# Patient Record
Sex: Male | Born: 1950
Health system: Southern US, Community
[De-identification: ages and names within clinical notes are randomized; demographics above are authoritative.]

## PROBLEM LIST (undated history)

## (undated) DIAGNOSIS — E119 Type 2 diabetes mellitus without complications: Secondary | ICD-10-CM

## (undated) DIAGNOSIS — I10 Essential (primary) hypertension: Secondary | ICD-10-CM

## (undated) DIAGNOSIS — C801 Malignant (primary) neoplasm, unspecified: Secondary | ICD-10-CM

## (undated) DIAGNOSIS — D649 Anemia, unspecified: Secondary | ICD-10-CM

## (undated) DIAGNOSIS — J45909 Unspecified asthma, uncomplicated: Secondary | ICD-10-CM

## (undated) HISTORY — PX: TONSILLECTOMY: SUR1361

## (undated) HISTORY — PX: WISDOM TOOTH EXTRACTION: SHX21

## (undated) HISTORY — PX: PROSTATE BIOPSY: SHX241

---

## 2000-03-08 ENCOUNTER — Encounter (INDEPENDENT_AMBULATORY_CARE_PROVIDER_SITE_OTHER): Payer: Self-pay | Admitting: *Deleted

## 2000-03-08 ENCOUNTER — Ambulatory Visit (HOSPITAL_COMMUNITY): Admission: RE | Admit: 2000-03-08 | Discharge: 2000-03-08 | Payer: Self-pay | Admitting: *Deleted

## 2002-10-23 ENCOUNTER — Encounter: Admission: RE | Admit: 2002-10-23 | Discharge: 2002-10-23 | Payer: Self-pay | Admitting: Gastroenterology

## 2002-10-23 ENCOUNTER — Encounter: Payer: Self-pay | Admitting: Gastroenterology

## 2003-01-30 ENCOUNTER — Encounter (INDEPENDENT_AMBULATORY_CARE_PROVIDER_SITE_OTHER): Payer: Self-pay | Admitting: *Deleted

## 2003-01-30 ENCOUNTER — Ambulatory Visit (HOSPITAL_COMMUNITY): Admission: RE | Admit: 2003-01-30 | Discharge: 2003-01-30 | Payer: Self-pay | Admitting: Gastroenterology

## 2006-01-04 ENCOUNTER — Ambulatory Visit: Admission: RE | Admit: 2006-01-04 | Discharge: 2006-04-04 | Payer: Self-pay | Admitting: Radiation Oncology

## 2006-06-06 ENCOUNTER — Ambulatory Visit: Admission: RE | Admit: 2006-06-06 | Discharge: 2006-08-12 | Payer: Self-pay | Admitting: Radiation Oncology

## 2006-06-15 ENCOUNTER — Ambulatory Visit (HOSPITAL_COMMUNITY): Admission: RE | Admit: 2006-06-15 | Discharge: 2006-06-15 | Payer: Self-pay | Admitting: Gastroenterology

## 2006-06-15 ENCOUNTER — Encounter (INDEPENDENT_AMBULATORY_CARE_PROVIDER_SITE_OTHER): Payer: Self-pay | Admitting: Gastroenterology

## 2006-08-12 ENCOUNTER — Ambulatory Visit: Admission: RE | Admit: 2006-08-12 | Discharge: 2006-11-10 | Payer: Self-pay | Admitting: Radiation Oncology

## 2006-09-27 ENCOUNTER — Encounter: Admission: RE | Admit: 2006-09-27 | Discharge: 2006-09-27 | Payer: Self-pay | Admitting: Urology

## 2006-10-03 ENCOUNTER — Ambulatory Visit (HOSPITAL_BASED_OUTPATIENT_CLINIC_OR_DEPARTMENT_OTHER): Admission: RE | Admit: 2006-10-03 | Discharge: 2006-10-03 | Payer: Self-pay | Admitting: Urology

## 2007-04-01 ENCOUNTER — Emergency Department (HOSPITAL_COMMUNITY): Admission: EM | Admit: 2007-04-01 | Discharge: 2007-04-01 | Payer: Self-pay | Admitting: Family Medicine

## 2008-01-26 HISTORY — PX: RADIOACTIVE SEED IMPLANT: SHX5150

## 2009-01-13 ENCOUNTER — Inpatient Hospital Stay (HOSPITAL_COMMUNITY): Admission: EM | Admit: 2009-01-13 | Discharge: 2009-01-17 | Payer: Self-pay | Admitting: Emergency Medicine

## 2009-01-29 ENCOUNTER — Encounter: Admission: RE | Admit: 2009-01-29 | Discharge: 2009-04-29 | Payer: Self-pay | Admitting: Internal Medicine

## 2010-04-27 LAB — BASIC METABOLIC PANEL
BUN: 25 mg/dL — ABNORMAL HIGH (ref 6–23)
CO2: 25 mEq/L (ref 19–32)
CO2: 27 mEq/L (ref 19–32)
Calcium: 8.3 mg/dL — ABNORMAL LOW (ref 8.4–10.5)
Calcium: 8.3 mg/dL — ABNORMAL LOW (ref 8.4–10.5)
Calcium: 8.5 mg/dL (ref 8.4–10.5)
Calcium: 9.4 mg/dL (ref 8.4–10.5)
Chloride: 100 mEq/L (ref 96–112)
Chloride: 106 mEq/L (ref 96–112)
Creatinine, Ser: 1.58 mg/dL — ABNORMAL HIGH (ref 0.4–1.5)
GFR calc Af Amer: >60 mL/min (ref >60)
GFR calc Af Amer: >60 mL/min (ref >60)
GFR calc non Af Amer: >60 mL/min (ref >60)
Glucose, Bld: 213 mg/dL — ABNORMAL HIGH (ref 70–99)
Glucose, Bld: 218 mg/dL — ABNORMAL HIGH (ref 70–99)
Glucose, Bld: 719 mg/dL (ref 70–99)
Potassium: 4 mEq/L (ref 3.5–5.1)
Sodium: 124 mEq/L — ABNORMAL LOW (ref 135–145)
Sodium: 138 mEq/L (ref 135–145)
Sodium: 138 mEq/L (ref 135–145)

## 2010-04-27 LAB — CK TOTAL AND CKMB (NOT AT ARMC)
CK, MB: 0.5 ng/mL (ref 0.3–4.0)
Relative Index: INVALID (ref 0.0–2.5)
Total CK: 62 U/L (ref 7–232)

## 2010-04-27 LAB — CARDIAC PANEL(CRET KIN+CKTOT+MB+TROPI)
CK, MB: 0.5 ng/mL (ref 0.3–4.0)
Relative Index: INVALID (ref 0.0–2.5)
Total CK: 57 U/L (ref 7–232)

## 2010-04-27 LAB — LIPID PANEL
LDL Cholesterol: 32 mg/dL (ref 0–99)
Total CHOL/HDL Ratio: 3.2 RATIO
VLDL: 59 mg/dL — ABNORMAL HIGH (ref 0–40)

## 2010-04-27 LAB — GLUCOSE, CAPILLARY
Glucose-Capillary: 215 mg/dL — ABNORMAL HIGH (ref 70–99)
Glucose-Capillary: 225 mg/dL — ABNORMAL HIGH (ref 70–99)
Glucose-Capillary: 226 mg/dL — ABNORMAL HIGH (ref 70–99)
Glucose-Capillary: 383 mg/dL — ABNORMAL HIGH (ref 70–99)
Glucose-Capillary: 386 mg/dL — ABNORMAL HIGH (ref 70–99)
Glucose-Capillary: 392 mg/dL — ABNORMAL HIGH (ref 70–99)
Glucose-Capillary: 542 mg/dL (ref 70–99)
Glucose-Capillary: >600 mg/dL (ref 70–99)

## 2010-04-27 LAB — POCT I-STAT, CHEM 8
BUN: 36 mg/dL — ABNORMAL HIGH (ref 6–23)
Chloride: 93 mEq/L — ABNORMAL LOW (ref 96–112)
Creatinine, Ser: 2 mg/dL — ABNORMAL HIGH (ref 0.4–1.5)
Sodium: 125 mEq/L — ABNORMAL LOW (ref 135–145)

## 2010-04-27 LAB — URINALYSIS, ROUTINE W REFLEX MICROSCOPIC
Glucose, UA: >1000 mg/dL — AB
Ketones, ur: NEGATIVE mg/dL
Leukocytes, UA: NEGATIVE
Nitrite: NEGATIVE
Protein, ur: NEGATIVE mg/dL

## 2010-04-27 LAB — POCT I-STAT 3, VENOUS BLOOD GAS (G3P V)
Bicarbonate: 30 mEq/L — ABNORMAL HIGH (ref 20.0–24.0)
pCO2, Ven: 42.4 mmHg — ABNORMAL LOW (ref 45.0–50.0)
pH, Ven: 7.458 — ABNORMAL HIGH (ref 7.250–7.300)
pO2, Ven: 25 mmHg — CL (ref 30.0–45.0)

## 2010-04-27 LAB — CBC
HCT: 31.2 % — ABNORMAL LOW (ref 39.0–52.0)
Hemoglobin: 13.5 g/dL (ref 13.0–17.0)
MCHC: 34.2 g/dL (ref 30.0–36.0)
MCV: 82.2 fL (ref 78.0–100.0)
MCV: 82.4 fL (ref 78.0–100.0)
Platelets: 238 10*3/uL (ref 150–400)
RBC: 3.78 MIL/uL — ABNORMAL LOW (ref 4.22–5.81)
RDW: 12.8 % (ref 11.5–15.5)
WBC: 5.5 10*3/uL (ref 4.0–10.5)
WBC: 5.8 10*3/uL (ref 4.0–10.5)

## 2010-04-27 LAB — HEMOGLOBIN A1C
Hgb A1c MFr Bld: 15.8 % — ABNORMAL HIGH (ref 4.6–6.1)
Mean Plasma Glucose: 407 mg/dL

## 2010-04-27 LAB — TSH: TSH: 0.704 u[IU]/mL (ref 0.350–4.500)

## 2010-06-09 NOTE — Op Note (Signed)
NAME:  Alexander Foster, Alexander Foster               ACCOUNT NO.:  [id number]   MEDICAL RECORD NO.:  [id number]          PATIENT TYPE:  AMB   LOCATION:  ENDO                         FACILITY:  [facility]   PHYSICIAN:  Anselmo Rod, M.D.  DATE OF BIRTH:  03-May-1950   DATE OF PROCEDURE:  06/15/2006  DATE OF DISCHARGE:                               OPERATIVE REPORT   PROCEDURE PERFORMED:  Colonoscopy with snare polypectomy x2.   ENDOSCOPIST:  Anselmo Rod, MD   INSTRUMENT USED:  Pentax video colonoscope.   INDICATIONS FOR PROCEDURE:  [age]-year-old African American male  with a personal history of prostate cancer and diabetes, undergoing a  screening colonoscopy to rule out colonic polyps, masses, etc.   PREPROCEDURE PREPARATION:  Informed consent was procured from the  patient.  The patient fasted for 4 hours prior to the procedure and  prepped with MoviPrep the night of and the morning of the procedure.  Risks and benefits of the procedure including a 10% miss rate of cancer  in polyp were discussed with the patient as well.   PREPROCEDURE PHYSICAL:  VITAL SIGNS:  The patient had stable vital  signs.  NECK:  Supple.  CHEST:  Clear to auscultation.  CARDIAC:  S1 and S2 regular.  ABDOMEN:  Soft with normal bowel sounds.   DESCRIPTION OF PROCEDURE:  The patient was placed in the left lateral  decubitus position and sedated with 75 mcg of Fentanyl and 7.5 mg of  Versed given intravenously in slow incremental doses.  Once the patient  was adequately sedated and maintained on low-flow oxygen and continuous  cardiac monitoring, the Pentax video colonoscope was advanced from the  rectum to the cecum.  Diverticulosis was noted throughout the colon with  more prominent changes in the left side.  Small internal hemorrhoids  were seen.  One flat polyp was removed via snare from the distal right  colon and 1 from the left colon (hot snare x2).  The terminal ileum  appeared healthy and without  lesions.  The appendiceal orifice and  ileocecal valve were clearly visualized and photographed.  The polyp  removed from the left colon was lost in stool.  The patient tolerated  the procedure well without complications.   IMPRESSION:  1. Small nonbleeding internal hemorrhoids.  2. One flat polyp removed from the left colon and 1 from the distal      right colon; polyp from the left colon was lost in stool.  3. Diverticulosis with more prominent changes in the sigmoid colon.  4. Normal terminal ileum.   RECOMMENDATIONS:  1. Await pathology results.  2. Avoid all nonsteroidals including aspirin for the next 2 weeks.  3. Repeat colonoscopy depending on pathology results.  4. A high-fiber diet has been advocated and brochures on      diverticulosis have been given to the patient for his education.  5. Outpatient followup as need arises in the future.      Anselmo Rod, M.D.  Electronically Signed     JNM/MEDQ  D:  06/16/2006  T:  06/16/2006  Job:  [id number]  cc:   Renaye Rakers, M.D.

## 2010-06-09 NOTE — Op Note (Signed)
NAME:  Alexander Foster, Alexander Foster               ACCOUNT NO.:  [id number]   MEDICAL RECORD NO.:  [id number]          PATIENT TYPE:  AMB   LOCATION:  [facility]                         FACILITY:  [facility]   PHYSICIAN:  Lindaann Slough, M.D.  DATE OF BIRTH:  12-18-1950   DATE OF PROCEDURE:  10/03/2006  DATE OF DISCHARGE:                               OPERATIVE REPORT   PREOPERATIVE DIAGNOSIS:  Adenocarcinoma of prostate.   POSTOPERATIVE DIAGNOSIS:  Adenocarcinoma of prostate.   PROCEDURE:  Cystoscopy and I-125 seeds implantation.   SURGEONS:  Danae Chen, M.D., and Maryln Gottron, M.D.   ANESTHESIA:  General.   INDICATIONS:  The patient is a 60 year old male with adenocarcinoma of  prostate and Gleason score 6.  PSA at diagnosis was 8.5.  His PSA was  2.9 in 2004, repeat PSA after the biopsy was down to 2.2.  The patient  was found to have one focus of adenocarcinoma of the prostate at the  left apex.  Treatment options were discussed with him.  Those options  include active surveillance, radical prostatectomy external beam  brachytherapy and cryoablation and he chose to have seeds implantation  in view of his low-volume disease.  He received one injection of Lupron  30 mg to downsize the prostate.  He is scheduled today to have the  procedure.   DESCRIPTION OF PROCEDURE:  Under general anesthesia, the patient was  prepped and draped and placed in the dorsal lithotomy position.  A Foley  catheter was inserted in the bladder and a rectal tube was inserted in  the rectum.  Then the transducer was inserted in the rectum.  The grid  was then attached to the transducer.  Two stabilizing needles were  placed in the prostate, then ultrasound planning was done by Dr. Dayton Scrape.  When the planning was completed, with the Nucletron a total of 62 seeds  were inserted in the prostate through 23 needles.  The total activity is  34.844 mCi.  When the seeds implantation was completed, the Foley  catheter was removed.   A flexible cystoscope was then inserted in the  bladder.  The anterior urethra is normal.  There are two seeds in the  bladder.  There is no stone or tumor.  The ureteral orifices are in  normal position and shape with clear efflux.  Then with a grasping  forceps, those two seeds were extracted.  The cystoscope was reinserted  in the bladder and there was no evidence of remaining seed in the  bladder.  Therefore a total of 60 seeds where inserted in the prostate.  The cystoscope was then removed.  A #16 Foley catheter was then inserted  in the bladder.   The patient tolerated the procedure well and left the OR in satisfactory  condition to post anesthesia care unit.      Lindaann Slough, M.D.  Electronically Signed     MN/MEDQ  D:  10/03/2006  T:  10/03/2006  Job:  [id number]

## 2010-06-12 NOTE — Op Note (Signed)
NAME:  Alexander Foster, Alexander Foster                         ACCOUNT NO.:  [id number]   MEDICAL RECORD NO.:  [id number]                   PATIENT TYPE:  AMB   LOCATION:  ENDO                                 FACILITY:  [facility]   PHYSICIAN:  Anselmo Rod, M.D.               DATE OF BIRTH:  12/21/1950   DATE OF PROCEDURE:  01/30/2003  DATE OF DISCHARGE:                                 OPERATIVE REPORT   PROCEDURE:  Colonoscopy with snare polypectomy x 2.   ENDOSCOPIST:  Charna Elizabeth, M.D.   INSTRUMENT USED:  Olympus video colonoscope.   INDICATIONS FOR PROCEDURE:  60 year old African American male with a history  of change in bowel habits, diverticulitis treated with antibiotics in the  recent past, and rectal bleeding, rule out colonic polyps, masses, etc.   PREPROCEDURE PREPARATION:  Informed consent was obtained from the patient.  The patient was fasted for eight hours prior to the procedure and prepped  with a bottle of magnesium citrate and a gallon of GoLYTELY the night prior  to the procedure.   PREPROCEDURE PHYSICAL:  Patient with stable vital signs.  Neck supple.  Chest clear to auscultation.  S1 and S2 regular.  Abdomen soft with normal  bowel sounds.   DESCRIPTION OF PROCEDURE:  The patient was placed in the left lateral  decubitus position, sedated with 50 mg of Demerol and 7 mg Versed in  incremental doses.  Once the patient was adequately sedated, maintained on  low flow oxygen, continuous cardiac monitoring, the Olympus video  colonoscope was advanced into the rectum to the cecum with difficulty.  The  patient had a large amount of residual stool in the colon secondary to an  inadequate prep.  There was scattered diverticula seen throughout the colon.  The appendiceal orifice and ileocecal valve were clearly visualized and  photographed.  Small internal hemorrhoids were seen on retroflexion of the  rectum.  Two small sessile polyps measuring about 4-6 mm were snared from  the  rectum.  The patient tolerated the procedure well without complications.  Small lesions could have been missed.   IMPRESSION:  1. Small nonbleeding internal hemorrhoids.  2. Scattered diverticulosis with more prominent changes in the sigmoid     colon.  3. Two small sessile polyps measuring 4-6 mm in size snared from the rectum.  4. Some residual stool in the colon, small lesions could have been missed.   RECOMMENDATIONS:  1. Await pathology results.  2. Avoid all nonsteroidals including aspirin for the next four weeks.  3. Outpatient follow up in the next two weeks for further recommendations.                                               Anselmo Rod, M.D.  JNM/MEDQ  D:  01/30/2003  T:  01/30/2003  Job:  [id number]   cc:   Renaye Rakers, M.D.  [phone]  [place]  [place] [address]  Fax: [phone]

## 2010-10-19 LAB — CULTURE, ROUTINE-ABSCESS

## 2010-11-06 LAB — COMPREHENSIVE METABOLIC PANEL
Albumin: 3.9
BUN: 17
Calcium: 9.5
Creatinine, Ser: 1.21
Potassium: 4.6
Total Protein: 7.5

## 2010-11-06 LAB — APTT: aPTT: 30

## 2010-11-06 LAB — CBC
HCT: 35.3 — ABNORMAL LOW
MCHC: 34.3
MCV: 79.9
Platelets: 292
RDW: 14.4 — ABNORMAL HIGH

## 2010-11-06 LAB — PROTIME-INR: INR: 1

## 2013-04-19 ENCOUNTER — Telehealth: Payer: Self-pay | Admitting: Internal Medicine

## 2013-04-19 NOTE — Telephone Encounter (Signed)
C/D 04/19/13 for appt. 05/08/13

## 2013-04-19 NOTE — Telephone Encounter (Signed)
S/W PATIENT AND GAVE NEW PATIENT APPT FOR 04/14 @ 11 W/DR. [place] [facility] WELCOME PACKET MAILED.

## 2013-04-23 NOTE — Telephone Encounter (Signed)
S/W PATIENT AND GAVE NEW PATIENT APPT FOR 04/14 @ 11 W/DR. MOHAMED °REFERRING DR. VEITA BLAND °[facility]- [facility] °WELCOME PACKET MAILED.  °

## 2013-05-07 ENCOUNTER — Other Ambulatory Visit: Payer: Self-pay | Admitting: Medical Oncology

## 2013-05-07 DIAGNOSIS — R710 Precipitous drop in hematocrit: Secondary | ICD-10-CM

## 2013-05-08 ENCOUNTER — Encounter: Payer: Self-pay | Admitting: Internal Medicine

## 2013-05-08 ENCOUNTER — Other Ambulatory Visit (HOSPITAL_BASED_OUTPATIENT_CLINIC_OR_DEPARTMENT_OTHER): Payer: BC Managed Care – PPO

## 2013-05-08 ENCOUNTER — Ambulatory Visit (HOSPITAL_BASED_OUTPATIENT_CLINIC_OR_DEPARTMENT_OTHER): Payer: BC Managed Care – PPO | Admitting: Internal Medicine

## 2013-05-08 ENCOUNTER — Ambulatory Visit: Payer: BC Managed Care – PPO

## 2013-05-08 ENCOUNTER — Telehealth: Payer: Self-pay | Admitting: Internal Medicine

## 2013-05-08 ENCOUNTER — Ambulatory Visit (HOSPITAL_BASED_OUTPATIENT_CLINIC_OR_DEPARTMENT_OTHER): Payer: BC Managed Care – PPO

## 2013-05-08 VITALS — BP 110/73 | HR 83 | Temp 98.6°F | Resp 20 | Ht 71.0 in | Wt 228.2 lb

## 2013-05-08 DIAGNOSIS — D649 Anemia, unspecified: Secondary | ICD-10-CM

## 2013-05-08 DIAGNOSIS — R710 Precipitous drop in hematocrit: Secondary | ICD-10-CM

## 2013-05-08 DIAGNOSIS — D539 Nutritional anemia, unspecified: Secondary | ICD-10-CM

## 2013-05-08 LAB — CBC WITH DIFFERENTIAL/PLATELET
BASO%: 0.3 % (ref 0.0–2.0)
Basophils Absolute: 0 10*3/uL (ref 0.0–0.1)
EOS%: 1.6 % (ref 0.0–7.0)
Eosinophils Absolute: 0.1 10*3/uL (ref 0.0–0.5)
HEMATOCRIT: 38.5 % (ref 38.4–49.9)
HGB: 12.6 g/dL — ABNORMAL LOW (ref 13.0–17.1)
LYMPH%: 27.7 % (ref 14.0–49.0)
MCH: 26.5 pg — AB (ref 27.2–33.4)
MCHC: 32.6 g/dL (ref 32.0–36.0)
MCV: 81.2 fL (ref 79.3–98.0)
MONO#: 0.4 10*3/uL (ref 0.1–0.9)
MONO%: 7.9 % (ref 0.0–14.0)
NEUT#: 3.3 10*3/uL (ref 1.5–6.5)
NEUT%: 62.5 % (ref 39.0–75.0)
PLATELETS: 240 10*3/uL (ref 140–400)
RBC: 4.74 10*6/uL (ref 4.20–5.82)
RDW: 13.8 % (ref 11.0–14.6)
WBC: 5.3 10*3/uL (ref 4.0–10.3)
lymph#: 1.5 10*3/uL (ref 0.9–3.3)

## 2013-05-08 LAB — RETICULOCYTES
Immature Retic Fract: 4.5 % (ref 3.00–10.60)
RBC: 4.72 10*6/uL (ref 4.20–5.82)
Retic %: 1.21 % (ref 0.80–1.80)
Retic Ct Abs: 57.11 10*3/uL (ref 34.80–93.90)

## 2013-05-08 LAB — IRON AND TIBC CHCC
%SAT: 39 % (ref 20–55)
Iron: 122 ug/dL (ref 42–163)
TIBC: 315 ug/dL (ref 202–409)
UIBC: 193 ug/dL (ref 117–376)

## 2013-05-08 LAB — FERRITIN CHCC: Ferritin: 36 ng/ml (ref 22–316)

## 2013-05-08 LAB — TSH CHCC: TSH: 0.668 m(IU)/L (ref 0.320–4.118)

## 2013-05-08 NOTE — Progress Notes (Signed)
Checked in new pt with no financial concerns. °

## 2013-05-08 NOTE — Telephone Encounter (Signed)
Gave pt appt for lab and Md fotr may , sent pt to labs today

## 2013-05-08 NOTE — Progress Notes (Signed)
[place] Telephone:([phone]   Fax:([phone]  CONSULT NOTE  REFERRING PHYSICIAN: Dr. Lucianne Lei  REASON FOR CONSULTATION:  63 years old African American male with persistent anemia.  HPI Alexander Foster is a 63 y.o. male with a past medical history significant for diabetes mellitus, hypertension, dyslipidemia, history of asthma and diagnosis of prostate adenocarcinoma status post seed implants in 2010 and the patient is currently followed by Dr. Janice Norrie. He was seen recently by his primary care physician Dr. Criss Rosales and CBC performed on 04/09/2013 showed low hemoglobin of 11.8 and hematocrit 35.0%. The total white blood count was normal at 4.8 and platelets count 223,000. Iron study performed on the same day showed serum iron of 64, total iron binding capacity 312 and iron saturation 21%. The patient has been complaining of increasing fatigue and weakness recently but he relates part of it to his low testosterone. He is expected to discuss androgen treatment with Dr. Janice Norrie. The patient denied having any dizzy spells. He has occasional rectal bleeding from questionable hemorrhoids. He had colonoscopy last performed and showed few platelets that were removed. He takes over-the-counter multivitamins and aspirin daily. He denied having any significant nausea, vomiting or change in his bowel movement. The patient denied having any chest pain, but has shortness of breath with exertion with no cough or hemoptysis. He denied having any weight loss or night sweats.  PAST MEDICAL HISTORY: Significant for diabetes mellitus, hypertension, dyslipidemia, asthma and history of prostate cancer status post seed implants in 2010.  FAMILY HISTORY: Mother died at age 65 with heart attack and she had COPD. Father is 33 and is still alive   SOCIAL HISTORY: The patient is married and has 2 sons and one daughter. He teaches at [employer]. he has no history of smoking but drinks alcohol occasionally  and no history of drug abuse.   HPI  No Known Allergies  Current Outpatient Prescriptions  Medication Sig Dispense Refill  . aspirin 81 MG tablet Take 81 mg by mouth daily.      . insulin glargine (LANTUS) 100 UNIT/ML injection Inject 10 Units into the skin 2 (two) times daily.      . Liraglutide (VICTOZA [place]) Inject 18 Units into the skin every morning.      . metFORMIN (GLUCOPHAGE) 500 MG tablet Take by mouth 2 (two) times daily with a meal.      . montelukast (SINGULAIR) 10 MG tablet Take 10 mg by mouth at bedtime.      . niacin (NIASPAN) 500 MG CR tablet Take 500 mg by mouth at bedtime.      . niacin-simvastatin (SIMCOR) 500-20 MG 24 hr tablet Take 1 tablet by mouth at bedtime.      . Olmesartan-Amlodipine-HCTZ (TRIBENZOR PO) Take 1 tablet by mouth daily.      . tamsulosin (FLOMAX) 0.4 MG CAPS capsule Take 0.4 mg by mouth daily after supper.       No current facility-administered medications for this visit.    Review of Systems  Constitutional: positive for fatigue Eyes: negative Ears, nose, mouth, throat, and face: negative Respiratory: positive for dyspnea on exertion Cardiovascular: negative Gastrointestinal: negative Genitourinary:negative Integument/breast: negative Hematologic/lymphatic: negative Musculoskeletal:negative Neurological: negative Behavioral/Psych: negative Endocrine: negative Allergic/Immunologic: negative  Physical Exam  QMV:HQION, healthy, no distress, well nourished and well developed SKIN: skin color, texture, turgor are normal, no rashes or significant lesions HEAD: Normocephalic, No masses, lesions, tenderness or abnormalities EYES: normal, PERRLA EARS: External ears  normal, Canals clear OROPHARYNX:no exudate, no erythema and lips, buccal mucosa, and tongue normal  NECK: supple, no adenopathy, no JVD LYMPH:  no palpable lymphadenopathy, no hepatosplenomegaly LUNGS: clear to auscultation , and palpation HEART: regular rate & rhythm, no  murmurs and no gallops ABDOMEN:abdomen soft, non-tender, normal bowel sounds and no masses or organomegaly BACK: Back symmetric, no curvature., No CVA tenderness EXTREMITIES:no joint deformities, effusion, or inflammation, no edema, no skin discoloration, no clubbing  NEURO: alert & oriented x 3 with fluent speech, no focal motor/sensory deficits  PERFORMANCE STATUS: ECOG 1  LABORATORY DATA: Lab Results  Component Value Date   WBC 5.3 05/08/2013   HGB 12.6* 05/08/2013   HCT 38.5 05/08/2013   MCV 81.2 05/08/2013   PLT 240 05/08/2013      Chemistry      Component Value Date/Time   NA 138 01/17/2009 0445   K 3.9 01/17/2009 0445   CL 106 01/17/2009 0445   CO2 25 01/17/2009 0445   BUN 10 01/17/2009 0445   CREATININE 1.15 01/17/2009 0445      Component Value Date/Time   CALCIUM 8.3* 01/17/2009 0445   ALKPHOS 56 09/27/2006 0850   AST 19 09/27/2006 0850   ALT 30 09/27/2006 0850   BILITOT 0.9 09/27/2006 0850       RADIOGRAPHIC STUDIES: No results found.  ASSESSMENT: This is a very pleasant 63 years old [place] American male with persistent anemia of unclear etiology at this point.   PLAN: I have a lengthy discussion with the patient today about his condition. I ordered several studies to evaluate the etiology of his anemia including repeat CBC, comprehensive metabolic panel, LDH, iron study, ferritin, serum erythropoietin, serum folate, serum B12 as well as serum protein electrophoreses I will see the patient back for followup visit in 3 weeks for evaluation and discussion of his lab results. If no clear etiology for his anemia, I may consider the patient for a bone marrow biopsy and aspirate to rule out any myelodysplastic abnormalities. The patient was advised to call immediately if he has any concerning symptoms in the interval. The patient voices understanding of current disease status and treatment options and is in agreement with the current care plan.  All questions were answered.  The patient knows to call the clinic with any problems, questions or concerns. We can certainly see the patient much sooner if necessary.  Thank you so much for allowing me to participate in the care of Alexander Foster. I will continue to follow up the patient with you and assist in his care.  I spent 40 minutes counseling the patient face to face. The total time spent in the appointment was 55 minutes.  Disclaimer: This note was dictated with voice recognition software. Similar sounding words can inadvertently be transcribed and may not be corrected upon review.   Curt Bears 05/08/2013, 12:12 PM

## 2013-05-10 LAB — PROTEIN ELECTROPHORESIS, SERUM, WITH REFLEX
ALBUMIN ELP: 51.9 % — AB (ref 55.8–66.1)
ALPHA-1-GLOBULIN: 6 % — AB (ref 2.9–4.9)
ALPHA-2-GLOBULIN: 11 % (ref 7.1–11.8)
BETA 2: 5.2 % (ref 3.2–6.5)
BETA GLOBULIN: 6.2 % (ref 4.7–7.2)
Gamma Globulin: 19.7 % — ABNORMAL HIGH (ref 11.1–18.8)
TOTAL PROTEIN, SERUM ELECTROPHOR: 7.8 g/dL (ref 6.0–8.3)

## 2013-05-10 LAB — FOLATE

## 2013-05-10 LAB — VITAMIN B12: VITAMIN B 12: 527 pg/mL (ref 211–911)

## 2013-05-10 LAB — ERYTHROPOIETIN: Erythropoietin: 20.6 m[IU]/mL — ABNORMAL HIGH (ref 2.6–18.5)

## 2013-05-30 ENCOUNTER — Other Ambulatory Visit (HOSPITAL_BASED_OUTPATIENT_CLINIC_OR_DEPARTMENT_OTHER): Payer: BC Managed Care – PPO

## 2013-05-30 ENCOUNTER — Telehealth: Payer: Self-pay | Admitting: Internal Medicine

## 2013-05-30 ENCOUNTER — Encounter: Payer: Self-pay | Admitting: Internal Medicine

## 2013-05-30 ENCOUNTER — Ambulatory Visit (HOSPITAL_BASED_OUTPATIENT_CLINIC_OR_DEPARTMENT_OTHER): Payer: BC Managed Care – PPO | Admitting: Internal Medicine

## 2013-05-30 VITALS — BP 135/73 | HR 75 | Temp 98.4°F | Resp 18 | Ht 71.0 in | Wt 232.3 lb

## 2013-05-30 DIAGNOSIS — R5381 Other malaise: Secondary | ICD-10-CM

## 2013-05-30 DIAGNOSIS — D539 Nutritional anemia, unspecified: Secondary | ICD-10-CM

## 2013-05-30 DIAGNOSIS — R5383 Other fatigue: Secondary | ICD-10-CM

## 2013-05-30 DIAGNOSIS — D649 Anemia, unspecified: Secondary | ICD-10-CM

## 2013-05-30 LAB — CBC WITH DIFFERENTIAL/PLATELET
BASO%: 0.1 % (ref 0.0–2.0)
BASOS ABS: 0 10*3/uL (ref 0.0–0.1)
EOS%: 2.1 % (ref 0.0–7.0)
Eosinophils Absolute: 0.1 10*3/uL (ref 0.0–0.5)
HEMATOCRIT: 35.6 % — AB (ref 38.4–49.9)
HEMOGLOBIN: 11.9 g/dL — AB (ref 13.0–17.1)
LYMPH%: 24.7 % (ref 14.0–49.0)
MCH: 27 pg — AB (ref 27.2–33.4)
MCHC: 33.3 g/dL (ref 32.0–36.0)
MCV: 80.9 fL (ref 79.3–98.0)
MONO#: 0.5 10*3/uL (ref 0.1–0.9)
MONO%: 10.1 % (ref 0.0–14.0)
NEUT#: 3.3 10*3/uL (ref 1.5–6.5)
NEUT%: 63 % (ref 39.0–75.0)
Platelets: 220 10*3/uL (ref 140–400)
RBC: 4.4 10*6/uL (ref 4.20–5.82)
RDW: 14.1 % (ref 11.0–14.6)
WBC: 5.2 10*3/uL (ref 4.0–10.3)
lymph#: 1.3 10*3/uL (ref 0.9–3.3)

## 2013-05-30 NOTE — Telephone Encounter (Signed)
Gave pt appt for lab and MD for June  °

## 2013-05-30 NOTE — Progress Notes (Signed)
[place] Telephone:([phone]   Fax:([phone]  OFFICE PROGRESS NOTE  BLAND,VEITA J, MD [address] [place] [address]  DIAGNOSIS: Mild anemia of unclear etiology.  PRIOR THERAPY: None  CURRENT THERAPY: None  INTERVAL HISTORY: Alexander Foster 63 y.o. male returns to the clinic today for followup visit. The patient had several studies performed recently to evaluate his anemia including iron study and ferritin that were unremarkable. Vitamin B 12 was normal, serum folate was normal. Serum protein electrophoreses showed no detectable M spike and serum erythropoietin level was elevated.  He is here today for evaluation and discussion of his lab results be the patient is feeling fine with no specific complaints except for mild fatigue. He denied having any dizzy spells. He denied having any chest pain, shortness of breath, cough or hemoptysis. He has no weight loss or night sweats.   ALLERGIES:  has No Known Allergies.  MEDICATIONS:  Current Outpatient Prescriptions  Medication Sig Dispense Refill  . aspirin 81 MG tablet Take 81 mg by mouth daily.      . insulin glargine (LANTUS) 100 UNIT/ML injection Inject 10 Units into the skin 2 (two) times daily.      . Liraglutide (VICTOZA [place]) Inject 18 Units into the skin every morning.      . metFORMIN (GLUCOPHAGE) 500 MG tablet Take by mouth 2 (two) times daily with a meal.      . montelukast (SINGULAIR) 10 MG tablet Take 10 mg by mouth at bedtime.      . niacin (NIASPAN) 500 MG CR tablet Take 500 mg by mouth at bedtime.      . niacin-simvastatin (SIMCOR) 500-20 MG 24 hr tablet Take 1 tablet by mouth at bedtime.      . Olmesartan-Amlodipine-HCTZ (TRIBENZOR PO) Take 1 tablet by mouth daily.      . tamsulosin (FLOMAX) 0.4 MG CAPS capsule Take 0.4 mg by mouth daily after supper.       No current facility-administered medications for this visit.    REVIEW OF SYSTEMS:  A comprehensive review of systems was  negative except for: Constitutional: positive for fatigue   PHYSICAL EXAMINATION: General appearance: alert, cooperative, fatigued and no distress Head: Normocephalic, without obvious abnormality, atraumatic Neck: no adenopathy, no JVD, supple, symmetrical, trachea midline and thyroid not enlarged, symmetric, no tenderness/mass/nodules Lymph nodes: Cervical, supraclavicular, and axillary nodes normal. Resp: clear to auscultation bilaterally Back: symmetric, no curvature. ROM normal. No CVA tenderness. Cardio: regular rate and rhythm, S1, S2 normal, no murmur, click, rub or gallop GI: soft, non-tender; bowel sounds normal; no masses,  no organomegaly Extremities: extremities normal, atraumatic, no cyanosis or edema  ECOG PERFORMANCE STATUS: 0 - Asymptomatic  Blood pressure 135/73, pulse 75, temperature 98.4 F (36.9 C), temperature source Oral, resp. rate 18, height _0  (1.803 m), weight 232 lb 4.8 oz (105.371 kg), SpO2 100.00%.  LABORATORY DATA: Lab Results  Component Value Date   WBC 5.2 05/30/2013   HGB 11.9* 05/30/2013   HCT 35.6* 05/30/2013   MCV 80.9 05/30/2013   PLT 220 05/30/2013      Chemistry      Component Value Date/Time   NA 138 01/17/2009 0445   K 3.9 01/17/2009 0445   CL 106 01/17/2009 0445   CO2 25 01/17/2009 0445   BUN 10 01/17/2009 0445   CREATININE 1.15 01/17/2009 0445      Component Value Date/Time   CALCIUM 8.3* 01/17/2009 0445  ALKPHOS 56 09/27/2006 0850   AST 19 09/27/2006 0850   ALT 30 09/27/2006 0850   BILITOT 0.9 09/27/2006 0850       RADIOGRAPHIC STUDIES: No results found.  ASSESSMENT AND PLAN: This is a very pleasant 63 years old African American male with mild anemia of unclear etiology questionable for anemia of chronic disease. I had a lengthy discussion with the patient today about his current lab results and further investigation to identify the etiology of his anemia. I recommended for the patient to proceed with the bone marrow biopsy and  aspirate to rule out any myelodysplasia.  The patient will be out of the country in the next few weeks and he would like to do the bone marrow biopsy and aspirate after he comes back. I would see him back for followup visit in one month for evaluation and discussion of his biopsy results. He was advised to call immediately if he has any concerning symptoms in the interval. The patient voices understanding of current disease status and treatment options and is in agreement with the current care plan.  All questions were answered. The patient knows to call the clinic with any problems, questions or concerns. We can certainly see the patient much sooner if necessary.  Disclaimer: This note was dictated with voice recognition software. Similar sounding words can inadvertently be transcribed and may not be corrected upon review.

## 2013-06-21 ENCOUNTER — Encounter (HOSPITAL_COMMUNITY): Payer: Self-pay | Admitting: Pharmacy Technician

## 2013-06-25 ENCOUNTER — Telehealth: Payer: Self-pay | Admitting: *Deleted

## 2013-06-25 ENCOUNTER — Other Ambulatory Visit: Payer: Self-pay | Admitting: Radiology

## 2013-06-25 NOTE — Telephone Encounter (Signed)
Pt called stating he has bronchitis and needs to delay his biopsy.  Forwarded msg to [facility] in scheduling to try to schedule biopsy a few days before next f/u.  SLJ

## 2013-06-27 ENCOUNTER — Ambulatory Visit (HOSPITAL_COMMUNITY): Admission: RE | Admit: 2013-06-27 | Payer: BC Managed Care – PPO | Source: Ambulatory Visit

## 2013-06-27 ENCOUNTER — Inpatient Hospital Stay (HOSPITAL_COMMUNITY): Admission: RE | Admit: 2013-06-27 | Payer: BC Managed Care – PPO | Source: Ambulatory Visit

## 2013-07-04 ENCOUNTER — Ambulatory Visit: Payer: BC Managed Care – PPO | Admitting: Internal Medicine

## 2013-07-04 ENCOUNTER — Telehealth: Payer: Self-pay | Admitting: Internal Medicine

## 2013-07-04 NOTE — Telephone Encounter (Signed)
returned pt call and r/s appt after BX....pt ok adn aware

## 2013-07-09 ENCOUNTER — Other Ambulatory Visit: Payer: Self-pay | Admitting: Radiology

## 2013-07-10 ENCOUNTER — Ambulatory Visit: Payer: BC Managed Care – PPO | Admitting: Internal Medicine

## 2013-07-10 ENCOUNTER — Other Ambulatory Visit: Payer: BC Managed Care – PPO

## 2013-07-13 ENCOUNTER — Encounter (HOSPITAL_COMMUNITY): Payer: Self-pay

## 2013-07-13 ENCOUNTER — Ambulatory Visit (HOSPITAL_COMMUNITY)
Admission: RE | Admit: 2013-07-13 | Discharge: 2013-07-13 | Disposition: A | Discharge status: Home / Self Care | Payer: BC Managed Care – PPO | Source: Ambulatory Visit | Attending: Internal Medicine | Admitting: Internal Medicine

## 2013-07-13 DIAGNOSIS — D649 Anemia, unspecified: Secondary | ICD-10-CM | POA: Insufficient documentation

## 2013-07-13 DIAGNOSIS — Z8546 Personal history of malignant neoplasm of prostate: Secondary | ICD-10-CM | POA: Insufficient documentation

## 2013-07-13 DIAGNOSIS — D539 Nutritional anemia, unspecified: Secondary | ICD-10-CM

## 2013-07-13 HISTORY — DX: Malignant (primary) neoplasm, unspecified: C80.1

## 2013-07-13 HISTORY — DX: Essential (primary) hypertension: I10

## 2013-07-13 HISTORY — DX: Type 2 diabetes mellitus without complications: E11.9

## 2013-07-13 HISTORY — DX: Anemia, unspecified: D64.9

## 2013-07-13 LAB — CBC
HCT: 33.5 % — ABNORMAL LOW (ref 39.0–52.0)
Hemoglobin: 11.3 g/dL — ABNORMAL LOW (ref 13.0–17.0)
MCH: 26.8 pg (ref 26.0–34.0)
MCHC: 33.7 g/dL (ref 30.0–36.0)
MCV: 79.4 fL (ref 78.0–100.0)
Platelets: 206 10*3/uL (ref 150–400)
RBC: 4.22 MIL/uL (ref 4.22–5.81)
RDW: 13.3 % (ref 11.5–15.5)
WBC: 4.8 10*3/uL (ref 4.0–10.5)

## 2013-07-13 LAB — APTT: aPTT: 30 seconds (ref 24–37)

## 2013-07-13 LAB — PROTIME-INR
INR: 1.05 (ref 0.00–1.49)
PROTHROMBIN TIME: 13.5 s (ref 11.6–15.2)

## 2013-07-13 LAB — GLUCOSE, CAPILLARY: Glucose-Capillary: 158 mg/dL — ABNORMAL HIGH (ref 70–99)

## 2013-07-13 LAB — BONE MARROW EXAM

## 2013-07-13 MED ORDER — MIDAZOLAM HCL 2 MG/2ML IJ SOLN
INTRAMUSCULAR | Status: AC
  Filled 2013-07-13: qty 6

## 2013-07-13 MED ORDER — MIDAZOLAM HCL 2 MG/2ML IJ SOLN
INTRAMUSCULAR | Status: AC | PRN
  Administered 2013-07-13 (×2): 1 mg via INTRAVENOUS

## 2013-07-13 MED ORDER — HYDROCODONE-ACETAMINOPHEN 5-325 MG PO TABS
1.0000 | ORAL_TABLET | ORAL | Status: DC | PRN
  Filled 2013-07-13: qty 2

## 2013-07-13 MED ORDER — FENTANYL CITRATE 0.05 MG/ML IJ SOLN
INTRAMUSCULAR | Status: AC | PRN
  Administered 2013-07-13 (×2): 50 ug via INTRAVENOUS

## 2013-07-13 MED ORDER — FENTANYL CITRATE 0.05 MG/ML IJ SOLN
INTRAMUSCULAR | Status: AC
  Filled 2013-07-13: qty 6

## 2013-07-13 MED ORDER — SODIUM CHLORIDE 0.9 % IV SOLN
INTRAVENOUS | Status: DC
  Administered 2013-07-13: 09:00:00 via INTRAVENOUS

## 2013-07-13 NOTE — H&P (Signed)
Chief Complaint: "I am here for a bone marrow biopsy." Referring Physician: Dr. Julien Nordmann HPI: Alexander Foster is an 63 y.o. male who presents today for a bone marrow biopsy. The patient sees Dr. Julien Nordmann for anemia of unclear etiology. He denies any chest pain, shortness of breath or palpitations. He denies any active signs of bleeding or excessive bruising. He denies any recent fever or chills. The patient denies any history of sleep apnea or chronic oxygen use. He has previously tolerated sedation without complications.   Past Medical History:  Past Medical History  Diagnosis Date  . Hypertension   . Diabetes mellitus without complication   . Anemia   . Cancer     seed implant for prostate cancer    Past Surgical History:  Past Surgical History  Procedure Laterality Date  . Radioactive seed implant  2010    Family History: No family history on file.  Social History:  reports that he has never smoked. He does not have any smokeless tobacco history on file. He reports that he drinks alcohol. His drug history is not on file.  Allergies: No Known Allergies  Medications:   Medication List    ASK your doctor about these medications       aspirin 81 MG chewable tablet  Chew 81 mg by mouth daily.     insulin glargine 100 UNIT/ML injection  Commonly known as:  LANTUS  Inject 10 Units into the skin 2 (two) times daily.     metFORMIN 500 MG tablet  Commonly known as:  GLUCOPHAGE  Take 500 mg by mouth 2 (two) times daily with a meal.     montelukast 10 MG tablet  Commonly known as:  SINGULAIR  Take 10 mg by mouth at bedtime.     multivitamin with minerals Tabs tablet  Take 1 tablet by mouth daily.     niacin 500 MG CR tablet  Commonly known as:  NIASPAN  Take 500 mg by mouth at bedtime.     niacin-simvastatin 500-20 MG 24 hr tablet  Commonly known as:  SIMCOR  Take 1 tablet by mouth at bedtime.     Olmesartan-Amlodipine-HCTZ 20-5-12.5 MG Tabs  Take 1 tablet by mouth  every morning.     tamsulosin 0.4 MG Caps capsule  Commonly known as:  FLOMAX  Take 0.4 mg by mouth daily after supper.     VICTOZA [place]  Inject 18 Units into the skin every morning.        Please HPI for pertinent positives, otherwise complete 10 system ROS negative.  Physical Exam: BP 132/78  Pulse 61  Temp(Src) 97.5 F (36.4 C) (Oral)  Resp 16  Ht 5' 11" (1.803 m)  Wt 220 lb (99.791 kg)  BMI 30.70 kg/m2  SpO2 99% Body mass index is 30.7 kg/(m^2).  General Appearance:  Alert, cooperative, no distress  Head:  Normocephalic, without obvious abnormality, atraumatic  Neck: Supple, symmetrical, trachea midline  Lungs:   Clear to auscultation bilaterally, no w/r/r, respirations unlabored without use of accessory muscles.  Chest Wall:  No tenderness or deformity  Heart:  Regular rate and rhythm, S1, S2 normal, no murmur, rub or gallop.  Abdomen:   Soft, non-tender, non distended, (+) BS  Extremities: Extremities normal, atraumatic, no cyanosis or edema  Neurologic: Normal affect, no gross deficits.   Results for orders placed during the hospital encounter of 07/13/13 (from the past 48 hour(s))  CBC     Status: Abnormal   Collection Time  07/13/13  8:32 AM      Result Value Ref Range   WBC 4.8  4.0 - 10.5 K/uL   RBC 4.22  4.22 - 5.81 MIL/uL   Hemoglobin 11.3 (*) 13.0 - 17.0 g/dL   HCT 33.5 (*) 39.0 - 52.0 %   MCV 79.4  78.0 - 100.0 fL   MCH 26.8  26.0 - 34.0 pg   MCHC 33.7  30.0 - 36.0 g/dL   RDW 13.3  11.5 - 15.5 %   Platelets 206  150 - 400 K/uL   No results found.  Assessment/Plan Anemia of unclear etiology Request for image guided bone marrow biopsy with moderate sedation. Patient has been NPO, labs reviewed. Risks and Benefits discussed with the patient. All of the patient's questions were answered, patient is agreeable to proceed. Consent signed and in chart.   Tsosie Billing D PA-C 07/13/2013, 9:09 AM

## 2013-07-13 NOTE — Discharge Instructions (Signed)
Conscious Sedation Sedation is the use of medicines to promote relaxation and relieve discomfort and anxiety. Conscious sedation is a type of sedation. Under conscious sedation you are less alert than normal but are still able to respond to instructions or stimulation. Conscious sedation is used during short medical and dental procedures. It is milder than deep sedation or general anesthesia and allows you to return to your regular activities sooner.  LET [facility] CARE PROVIDER KNOW ABOUT:   Any allergies you have.  All medicines you are taking, including vitamins, herbs, eye drops, creams, and over-the-counter medicines.  Use of steroids (by mouth or creams).  Previous problems you or members of your family have had with the use of anesthetics.  Any blood disorders you have.  Previous surgeries you have had.  Medical conditions you have.  Possibility of pregnancy, if this applies.  Use of cigarettes, alcohol, or illegal drugs. RISKS AND COMPLICATIONS Generally, this is a safe procedure. However, as with any procedure, problems can occur. Possible problems include:  Oversedation.  Trouble breathing on your own. You may need to have a breathing tube until you are awake and breathing on your own.  Allergic reaction to any of the medicines used for the procedure. BEFORE THE PROCEDURE  You may have blood tests done. These tests can help show how well your kidneys and liver are working. They can also show how well your blood clots.  A physical exam will be done.  Only take medicines as directed by your health care provider. You may need to stop taking medicines (such as blood thinners, aspirin, or nonsteroidal anti-inflammatory drugs) before the procedure.   Do not eat or drink at least 6 hours before the procedure or as directed by your health care provider.  Arrange for a responsible adult, family member, or friend to take you home after the procedure. He or she should stay  with you for at least 24 hours after the procedure, until the medicine has worn off. PROCEDURE   An intravenous (IV) catheter will be inserted into one of your veins. Medicine will be able to flow directly into your body through this catheter. You may be given medicine through this tube to help prevent pain and help you relax.  The medical or dental procedure will be done. AFTER THE PROCEDURE  You will stay in a recovery area until the medicine has worn off. Your blood pressure and pulse will be checked.   Depending on the procedure you had, you may be allowed to go home when you can tolerate liquids and your pain is under control. Document Released: 10/06/2000 Document Revised: 01/16/2013 Document Reviewed: 09/18/2012 [facility] Patient Information 2015 [place], [place]. This information is not intended to replace advice given to you by your health care provider. Make sure you discuss any questions you have with your health care provider. Bone Marrow Aspiration, Bone Marrow Biopsy Care After Read the instructions outlined below and refer to this sheet in the next few weeks. These discharge instructions provide you with general information on caring for yourself after you leave the hospital. Your caregiver may also give you specific instructions. While your treatment has been planned according to the most current medical practices available, unavoidable complications occasionally occur. If you have any problems or questions after discharge, call your caregiver. FINDING OUT THE RESULTS OF YOUR TEST Not all test results are available during your visit. If your test results are not back during the visit, make an appointment with your  your caregiver to find out the results. Do not assume everything is normal if you have not heard from your caregiver or the medical facility. It is important for you to follow up on all of your test results.  °HOME CARE INSTRUCTIONS  °You have had sedation and may be sleepy or  dizzy. Your thinking may not be as clear as usual. For the next 24 hours: °· Only take over-the-counter or prescription medicines for pain, discomfort, and or fever as directed by your caregiver. °· Do not drink alcohol. °· Do not smoke. °· Do not drive. °· Do not make important legal decisions. °· Do not operate heavy machinery. °· Do not care for small children by yourself. °· Keep your dressing clean and dry. You may replace dressing with a bandage after 24 hours. °· You may take a bath or shower after 24 hours. °· Use an ice pack for 20 minutes every 2 hours while awake for pain as needed. °SEEK MEDICAL CARE IF:  °· There is redness, swelling, or increasing pain at the biopsy site. °· There is pus coming from the biopsy site. °· There is drainage from a biopsy site lasting longer than one day. °· An unexplained oral temperature above 102° F (38.9° C) develops. °SEEK IMMEDIATE MEDICAL CARE IF:  °· You develop a rash. °· You have difficulty breathing. °· You develop any reaction or side effects to medications given. °Document Released: 07/31/2004 Document Revised: 04/05/2011 Document Reviewed: 01/09/2008 °Exit[employer]® Patient Information ©2015 [employer]. This information is not intended to replace advice given to you by your health care provider. Make sure you discuss any questions you have with your health care provider. ° °

## 2013-07-13 NOTE — Procedures (Signed)
CT-guided  R iliac bone marrow aspiration and core biopsy No complication No blood loss. See complete dictation in [facility] PACS  

## 2013-07-23 ENCOUNTER — Other Ambulatory Visit (HOSPITAL_BASED_OUTPATIENT_CLINIC_OR_DEPARTMENT_OTHER): Payer: BC Managed Care – PPO

## 2013-07-23 ENCOUNTER — Telehealth: Payer: Self-pay | Admitting: Internal Medicine

## 2013-07-23 ENCOUNTER — Encounter: Payer: Self-pay | Admitting: Internal Medicine

## 2013-07-23 ENCOUNTER — Ambulatory Visit (HOSPITAL_BASED_OUTPATIENT_CLINIC_OR_DEPARTMENT_OTHER): Payer: BC Managed Care – PPO | Admitting: Internal Medicine

## 2013-07-23 VITALS — BP 119/73 | HR 70 | Temp 97.8°F | Resp 18 | Ht 71.0 in | Wt 225.6 lb

## 2013-07-23 DIAGNOSIS — D539 Nutritional anemia, unspecified: Secondary | ICD-10-CM

## 2013-07-23 DIAGNOSIS — D649 Anemia, unspecified: Secondary | ICD-10-CM

## 2013-07-23 LAB — CBC WITH DIFFERENTIAL/PLATELET
BASO%: 0.4 % (ref 0.0–2.0)
BASOS ABS: 0 10*3/uL (ref 0.0–0.1)
EOS%: 2.8 % (ref 0.0–7.0)
Eosinophils Absolute: 0.1 10*3/uL (ref 0.0–0.5)
HEMATOCRIT: 35.9 % — AB (ref 38.4–49.9)
HEMOGLOBIN: 11.9 g/dL — AB (ref 13.0–17.1)
LYMPH%: 30.6 % (ref 14.0–49.0)
MCH: 26.7 pg — AB (ref 27.2–33.4)
MCHC: 33.2 g/dL (ref 32.0–36.0)
MCV: 80.6 fL (ref 79.3–98.0)
MONO#: 0.5 10*3/uL (ref 0.1–0.9)
MONO%: 9.8 % (ref 0.0–14.0)
NEUT#: 2.9 10*3/uL (ref 1.5–6.5)
NEUT%: 56.4 % (ref 39.0–75.0)
PLATELETS: 212 10*3/uL (ref 140–400)
RBC: 4.46 10*6/uL (ref 4.20–5.82)
RDW: 14.5 % (ref 11.0–14.6)
WBC: 5.1 10*3/uL (ref 4.0–10.3)
lymph#: 1.6 10*3/uL (ref 0.9–3.3)

## 2013-07-23 NOTE — Telephone Encounter (Signed)
Gave pt appt for lab and Md  °

## 2013-07-23 NOTE — Progress Notes (Signed)
[facility] Telephone:([phone]   Fax:([phone]  OFFICE PROGRESS NOTE  BLAND,VEITA J, MD [address] [place] [address]  DIAGNOSIS: Mild anemia of unclear etiology.  PRIOR THERAPY: None  CURRENT THERAPY: None  INTERVAL HISTORY: Alexander Foster 63 y.o. male returns to the clinic today for followup visit.  The patient recently underwent bone marrow biopsy and aspirate. He is feeling fine with no specific complaints except for mild fatigue. He denied having any dizzy spells. He denied having any chest pain, shortness of breath, cough or hemoptysis. He has no weight loss or night sweats. He is here today for evaluation and discussion of his biopsy results and recommendation regarding his condition. The patient mentions that he sees Dr. Janice Norrie for androgen deprivation after his prostate cancer treatment. He has not started any androgen therapy yet.  ALLERGIES:  has No Known Allergies.  MEDICATIONS:  Current Outpatient Prescriptions  Medication Sig Dispense Refill  . aspirin 81 MG chewable tablet Chew 81 mg by mouth daily.      . insulin glargine (LANTUS) 100 UNIT/ML injection Inject 10 Units into the skin 2 (two) times daily.      . Liraglutide (VICTOZA [place]) Inject 18 Units into the skin every morning.      . metFORMIN (GLUCOPHAGE) 500 MG tablet Take 500 mg by mouth 2 (two) times daily with a meal.       . montelukast (SINGULAIR) 10 MG tablet Take 10 mg by mouth at bedtime.      . Multiple Vitamin (MULTIVITAMIN WITH MINERALS) TABS tablet Take 1 tablet by mouth daily.      . niacin (NIASPAN) 500 MG CR tablet Take 500 mg by mouth at bedtime.      . niacin-simvastatin (SIMCOR) 500-20 MG 24 hr tablet Take 1 tablet by mouth at bedtime.      . Olmesartan-Amlodipine-HCTZ 20-5-12.5 MG TABS Take 1 tablet by mouth every morning.      . tamsulosin (FLOMAX) 0.4 MG CAPS capsule Take 0.4 mg by mouth daily after supper.       No current facility-administered  medications for this visit.    REVIEW OF SYSTEMS:  A comprehensive review of systems was negative except for: Constitutional: positive for fatigue   PHYSICAL EXAMINATION: General appearance: alert, cooperative, fatigued and no distress Head: Normocephalic, without obvious abnormality, atraumatic Neck: no adenopathy, no JVD, supple, symmetrical, trachea midline and thyroid not enlarged, symmetric, no tenderness/mass/nodules Lymph nodes: Cervical, supraclavicular, and axillary nodes normal. Resp: clear to auscultation bilaterally Back: symmetric, no curvature. ROM normal. No CVA tenderness. Cardio: regular rate and rhythm, S1, S2 normal, no murmur, click, rub or gallop GI: soft, non-tender; bowel sounds normal; no masses,  no organomegaly Extremities: extremities normal, atraumatic, no cyanosis or edema  ECOG PERFORMANCE STATUS: 0 - Asymptomatic  Blood pressure 119/73, pulse 70, temperature 97.8 F (36.6 C), temperature source Oral, resp. rate 18, height 5' 11"  (1.803 m), weight 225 lb 9.6 oz (102.331 kg).  LABORATORY DATA: Lab Results  Component Value Date   WBC 5.1 07/23/2013   HGB 11.9* 07/23/2013   HCT 35.9* 07/23/2013   MCV 80.6 07/23/2013   PLT 212 07/23/2013      Chemistry      Component Value Date/Time   NA 138 01/17/2009 0445   K 3.9 01/17/2009 0445   CL 106 01/17/2009 0445   CO2 25 01/17/2009 0445   BUN 10 01/17/2009 0445   CREATININE 1.15  01/17/2009 0445      Component Value Date/Time   CALCIUM 8.3* 01/17/2009 0445   ALKPHOS 56 09/27/2006 0850   AST 19 09/27/2006 0850   ALT 30 09/27/2006 0850   BILITOT 0.9 09/27/2006 0850       RADIOGRAPHIC STUDIES: Ct Biopsy  07/13/2013   CLINICAL DATA:  Anemia of uncertain etiology  EXAM: CT GUIDED DEEP ILIAC BONE ASPIRATION AND CORE BIOPSY  TECHNIQUE: The procedure, risks (including but not limited to bleeding, infection, organ damage ), benefits, and alternatives were explained to the patient. Questions regarding the procedure were  encouraged and answered. The patient understands and consents to the procedure. Patient was placed supine on the CT gantry and limited axial scans through the pelvis were obtained. Appropriate skin entry site was identified. Skin site was marked, prepped with Betadine, draped in usual sterile fashion, and infiltrated locally with 1% lidocaine.  Intravenous Fentanyl and Versed were administered as conscious sedation during continuous cardiorespiratory monitoring by the radiology RN, with a total moderate sedation time of 8 minutes.  Under CT fluoroscopic guidance an 11-gauge Cook trocar bone needle was advanced into the right iliac bone just lateral to the sacroiliac joint. Once needle tip position was confirmed, core and aspiration samples were obtained. The final sample was obtained using the guiding needle itself, which was then removed. Post procedure scans show no hematoma or fracture. Patient tolerated procedure well, with no immediate complication.  IMPRESSION: 1. Technically successful CT guided right iliac bone core and aspiration biopsy.   Electronically Signed   By: Arne Cleveland M.D.   On: 07/13/2013 10:51   BONE MARROW REPORT FINAL DIAGNOSIS Diagnosis Bone Marrow, Aspirate,Biopsy, and Clot, right iliac - NORMOCELLULAR MARROW WITH TRILINEAGE HEMATOPOIESIS AND MATURATION. - MILD POLYTYPIC PLASMACYTOSIS (5%). - SEE COMMENT. PERIPHERAL BLOOD: - NORMOCYTIC ANEMIA. Diagnosis Note Overall, the marrow is normocellular for age with trilineage hematopoiesis and maturation. There is a mild polytypic plasmacytosis, which is likely reactive. While there is no significant dysplasia in any lineage, correlation with cytogenetics is still recommended. Vicente Males MD Pathologist, Electronic Signature (Case signed 07/17/2013)  ASSESSMENT AND PLAN: This is a very pleasant 63 years old African American male with mild anemia of unclear etiology questionable for anemia of chronic disease, but this could be  also secondary to androgen deprivation after his treatment for prostate cancer. His recent bone marrow biopsy and aspirate is unremarkable for any significant abnormalities except for nonspecific mild plasmacytosis. I discussed the biopsy results with the patient. I recommended for him to continue on observation. I also recommended for him to take over-the-counter iron tablets 1-2 tablets on daily basis. His anemia may improve if the patient starts androgen treatment by Dr. Janice Norrie. I would see him back for followup visit in 6 months with repeat CBC and iron study. He was advised to call immediately if he has any concerning symptoms in the interval. The patient voices understanding of current disease status and treatment options and is in agreement with the current care plan.  All questions were answered. The patient knows to call the clinic with any problems, questions or concerns. We can certainly see the patient much sooner if necessary.  Disclaimer: This note was dictated with voice recognition software. Similar sounding words can inadvertently be transcribed and may not be corrected upon review.

## 2013-07-24 LAB — CHROMOSOME ANALYSIS, BONE MARROW

## 2014-01-15 ENCOUNTER — Other Ambulatory Visit: Payer: BC Managed Care – PPO

## 2014-01-22 ENCOUNTER — Ambulatory Visit: Payer: BC Managed Care – PPO | Admitting: Internal Medicine

## 2014-01-24 ENCOUNTER — Telehealth: Payer: Self-pay | Admitting: Internal Medicine

## 2014-01-24 ENCOUNTER — Other Ambulatory Visit: Payer: Self-pay | Admitting: *Deleted

## 2014-01-24 NOTE — Telephone Encounter (Signed)
lvm for pt regarding to Jan 2016 appt....mailed pt appt sched and letter °

## 2014-02-18 ENCOUNTER — Other Ambulatory Visit: Payer: BC Managed Care – PPO

## 2014-02-19 ENCOUNTER — Other Ambulatory Visit: Payer: Self-pay | Admitting: Internal Medicine

## 2014-02-19 ENCOUNTER — Ambulatory Visit (HOSPITAL_BASED_OUTPATIENT_CLINIC_OR_DEPARTMENT_OTHER): Payer: BC Managed Care – PPO

## 2014-02-19 DIAGNOSIS — D539 Nutritional anemia, unspecified: Secondary | ICD-10-CM

## 2014-02-19 DIAGNOSIS — D649 Anemia, unspecified: Secondary | ICD-10-CM

## 2014-02-19 LAB — IRON AND TIBC CHCC
%SAT: 29 % (ref 20–55)
Iron: 86 ug/dL (ref 42–163)
TIBC: 296 ug/dL (ref 202–409)
UIBC: 210 ug/dL (ref 117–376)

## 2014-02-19 LAB — CBC WITH DIFFERENTIAL/PLATELET
BASO%: 0.2 % (ref 0.0–2.0)
Basophils Absolute: 0 10*3/uL (ref 0.0–0.1)
EOS%: 1.8 % (ref 0.0–7.0)
Eosinophils Absolute: 0.1 10*3/uL (ref 0.0–0.5)
HCT: 38.6 % (ref 38.4–49.9)
HEMOGLOBIN: 12.9 g/dL — AB (ref 13.0–17.1)
LYMPH%: 36 % (ref 14.0–49.0)
MCH: 27.2 pg (ref 27.2–33.4)
MCHC: 33.4 g/dL (ref 32.0–36.0)
MCV: 81.3 fL (ref 79.3–98.0)
MONO#: 0.4 10*3/uL (ref 0.1–0.9)
MONO%: 8.3 % (ref 0.0–14.0)
NEUT#: 2.6 10*3/uL (ref 1.5–6.5)
NEUT%: 53.7 % (ref 39.0–75.0)
Platelets: 213 10*3/uL (ref 140–400)
RBC: 4.75 10*6/uL (ref 4.20–5.82)
RDW: 13.3 % (ref 11.0–14.6)
WBC: 4.9 10*3/uL (ref 4.0–10.3)
lymph#: 1.8 10*3/uL (ref 0.9–3.3)

## 2014-02-19 LAB — FERRITIN CHCC: FERRITIN: 71 ng/mL (ref 22–316)

## 2014-02-20 ENCOUNTER — Ambulatory Visit (HOSPITAL_BASED_OUTPATIENT_CLINIC_OR_DEPARTMENT_OTHER): Payer: BC Managed Care – PPO | Admitting: Internal Medicine

## 2014-02-20 ENCOUNTER — Encounter: Payer: Self-pay | Admitting: Internal Medicine

## 2014-02-20 ENCOUNTER — Telehealth: Payer: Self-pay | Admitting: Internal Medicine

## 2014-02-20 VITALS — BP 121/76 | HR 76 | Temp 97.8°F | Resp 19 | Ht 71.0 in | Wt 233.8 lb

## 2014-02-20 DIAGNOSIS — D649 Anemia, unspecified: Secondary | ICD-10-CM

## 2014-02-20 DIAGNOSIS — D539 Nutritional anemia, unspecified: Secondary | ICD-10-CM

## 2014-02-20 NOTE — Progress Notes (Signed)
[facility] Telephone:([phone]   Fax:(336) [phone]  OFFICE PROGRESS NOTE  BLAND,VEITA J, MD [address] [place] [address]  DIAGNOSIS: Mild anemia of unclear etiology.  PRIOR THERAPY: None  CURRENT THERAPY: Oral iron tablets with fusion plus 1 capsule by mouth daily  INTERVAL HISTORY: Alexander Foster 64 y.o. male returns to the clinic today for followup visit.  He is feeling fine with no specific complaints. He denied having any significant fatigue or dizzy spells. He denied having any chest pain, shortness of breath, cough or hemoptysis. He has no weight loss or night sweats. He had repeat CBC, iron study and ferritin performed recently and he is here for evaluation and discussion of his lab results.  ALLERGIES:  has No Known Allergies.  MEDICATIONS:  Current Outpatient Prescriptions  Medication Sig Dispense Refill  . aspirin 81 MG chewable tablet Chew 81 mg by mouth daily.    . insulin glargine (LANTUS) 100 UNIT/ML injection Inject 10 Units into the skin 2 (two) times daily.    . Liraglutide (VICTOZA [place]) Inject 18 Units into the skin every morning.    . metFORMIN (GLUCOPHAGE) 500 MG tablet Take 500 mg by mouth 2 (two) times daily with a meal.     . montelukast (SINGULAIR) 10 MG tablet Take 10 mg by mouth at bedtime.    . Multiple Vitamin (MULTIVITAMIN WITH MINERALS) TABS tablet Take 1 tablet by mouth daily.    . niacin (NIASPAN) 500 MG CR tablet Take 500 mg by mouth at bedtime.    . niacin-simvastatin (SIMCOR) 500-20 MG 24 hr tablet Take 1 tablet by mouth at bedtime.    . Olmesartan-Amlodipine-HCTZ 20-5-12.5 MG TABS Take 1 tablet by mouth every morning.    . tamsulosin (FLOMAX) 0.4 MG CAPS capsule Take 0.4 mg by mouth daily after supper.     No current facility-administered medications for this visit.    REVIEW OF SYSTEMS:  A comprehensive review of systems was negative.   PHYSICAL EXAMINATION: General appearance: alert, cooperative,  fatigued and no distress Head: Normocephalic, without obvious abnormality, atraumatic Neck: no adenopathy, no JVD, supple, symmetrical, trachea midline and thyroid not enlarged, symmetric, no tenderness/mass/nodules Lymph nodes: Cervical, supraclavicular, and axillary nodes normal. Resp: clear to auscultation bilaterally Back: symmetric, no curvature. ROM normal. No CVA tenderness. Cardio: regular rate and rhythm, S1, S2 normal, no murmur, click, rub or gallop GI: soft, non-tender; bowel sounds normal; no masses,  no organomegaly Extremities: extremities normal, atraumatic, no cyanosis or edema  ECOG PERFORMANCE STATUS: 0 - Asymptomatic  Blood pressure 121/76, pulse 76, temperature 97.8 F (36.6 C), temperature source Oral, resp. rate 19, height 5\' 11"  (1.803 m), weight 233 lb 12.8 oz (106.051 kg), SpO2 100 %.  LABORATORY DATA: Lab Results  Component Value Date   WBC 4.9 02/19/2014   HGB 12.9* 02/19/2014   HCT 38.6 02/19/2014   MCV 81.3 02/19/2014   PLT 213 02/19/2014      Chemistry      Component Value Date/Time   NA 138 01/17/2009 0445   K 3.9 01/17/2009 0445   CL 106 01/17/2009 0445   CO2 25 01/17/2009 0445   BUN 10 01/17/2009 0445   CREATININE 1.15 01/17/2009 0445      Component Value Date/Time   CALCIUM 8.3* 01/17/2009 0445   ALKPHOS 56 09/27/2006 0850   AST 19 09/27/2006 0850   ALT 30 09/27/2006 0850   BILITOT 0.9 09/27/2006 0850  Iron study: Ferritin 71, serum iron 86, total iron binding capacity 296 and iron saturation 29%.  RADIOGRAPHIC STUDIES:  ASSESSMENT AND PLAN: This is a very pleasant 64 years old [place] American male with mild anemia of unclear etiology questionable for anemia of chronic disease, but this could be also secondary to androgen deprivation after his treatment for prostate cancer. His CBC and iron study are unremarkable today. I recommended for the patient to continue on the oral iron tablets for now. I would see him back for followup  visit in 6 months with repeat CBC, iron study and ferritin. He was advised to call immediately if he has any concerning symptoms in the interval. The patient voices understanding of current disease status and treatment options and is in agreement with the current care plan.  All questions were answered. The patient knows to call the clinic with any problems, questions or concerns. We can certainly see the patient much sooner if necessary.  Disclaimer: This note was dictated with voice recognition software. Similar sounding words can inadvertently be transcribed and may not be corrected upon review.

## 2014-02-20 NOTE — Telephone Encounter (Signed)
Gave avs & calendar for July/August. °

## 2014-08-23 ENCOUNTER — Ambulatory Visit (HOSPITAL_BASED_OUTPATIENT_CLINIC_OR_DEPARTMENT_OTHER): Payer: BC Managed Care – PPO

## 2014-08-23 ENCOUNTER — Other Ambulatory Visit: Payer: BC Managed Care – PPO

## 2014-08-23 DIAGNOSIS — D649 Anemia, unspecified: Secondary | ICD-10-CM | POA: Diagnosis not present

## 2014-08-23 DIAGNOSIS — D539 Nutritional anemia, unspecified: Secondary | ICD-10-CM

## 2014-08-23 LAB — CBC WITH DIFFERENTIAL/PLATELET
BASO%: 0.2 % (ref 0.0–2.0)
Basophils Absolute: 0 10*3/uL (ref 0.0–0.1)
EOS ABS: 0.1 10*3/uL (ref 0.0–0.5)
EOS%: 1.8 % (ref 0.0–7.0)
HCT: 37.4 % — ABNORMAL LOW (ref 38.4–49.9)
HGB: 12.5 g/dL — ABNORMAL LOW (ref 13.0–17.1)
LYMPH#: 1.4 10*3/uL (ref 0.9–3.3)
LYMPH%: 24.7 % (ref 14.0–49.0)
MCH: 27 pg — AB (ref 27.2–33.4)
MCHC: 33.5 g/dL (ref 32.0–36.0)
MCV: 80.7 fL (ref 79.3–98.0)
MONO#: 0.6 10*3/uL (ref 0.1–0.9)
MONO%: 10.3 % (ref 0.0–14.0)
NEUT#: 3.5 10*3/uL (ref 1.5–6.5)
NEUT%: 63 % (ref 39.0–75.0)
Platelets: 250 10*3/uL (ref 140–400)
RBC: 4.64 10*6/uL (ref 4.20–5.82)
RDW: 14.7 % — AB (ref 11.0–14.6)
WBC: 5.6 10*3/uL (ref 4.0–10.3)

## 2014-08-23 LAB — FERRITIN CHCC: Ferritin: 69 ng/ml (ref 22–316)

## 2014-08-23 LAB — IRON AND TIBC CHCC
%SAT: 18 % — ABNORMAL LOW (ref 20–55)
Iron: 55 ug/dL (ref 42–163)
TIBC: 300 ug/dL (ref 202–409)
UIBC: 245 ug/dL (ref 117–376)

## 2014-08-29 ENCOUNTER — Ambulatory Visit (HOSPITAL_BASED_OUTPATIENT_CLINIC_OR_DEPARTMENT_OTHER): Payer: BC Managed Care – PPO | Admitting: Internal Medicine

## 2014-08-29 ENCOUNTER — Telehealth: Payer: Self-pay | Admitting: Internal Medicine

## 2014-08-29 ENCOUNTER — Encounter: Payer: Self-pay | Admitting: Internal Medicine

## 2014-08-29 VITALS — BP 122/70 | HR 77 | Temp 98.3°F | Resp 18 | Ht 71.0 in | Wt 231.9 lb

## 2014-08-29 DIAGNOSIS — D649 Anemia, unspecified: Secondary | ICD-10-CM | POA: Diagnosis not present

## 2014-08-29 DIAGNOSIS — D539 Nutritional anemia, unspecified: Secondary | ICD-10-CM

## 2014-08-29 NOTE — Telephone Encounter (Signed)
per pof to sch pt appt-gave pt copy of avs °

## 2014-08-29 NOTE — Progress Notes (Signed)
Alexander [phone]   Fax:([phone]) [place], MD [address] [place] [address]  DIAGNOSIS: Mild anemia of unclear etiology questionable for iron deficiency.  PRIOR THERAPY: None  CURRENT THERAPY: Oral iron tablets with fusion plus 1 capsule by mouth daily  INTERVAL HISTORY: Alexander Foster 64 y.o. male returns to the clinic today for 6 months followup visit.  He is feeling fine with no specific complaints. He denied having any significant fatigue or dizzy spells. He denied having any chest pain, shortness of breath, cough or hemoptysis. He has no weight loss or night sweats. He has not taken his iron tablets for the last 2 months. He had repeat CBC, iron study and ferritin performed recently and he is here for evaluation and discussion of his lab results.  ALLERGIES:  has No Known Allergies.  MEDICATIONS:  Current Outpatient Prescriptions  Medication Sig Dispense Refill  . aspirin 81 MG chewable tablet Chew 81 mg by mouth daily.    . [employer] ALLERGY RELIEF D 60-120 MG per tablet Take 1 tablet by mouth 2 (two) times daily.  3  . insulin glargine (LANTUS) 100 UNIT/ML injection Inject 10 Units into the skin 2 (two) times daily.    . Iron-FA-B Cmp-C-Biot-Probiotic (FUSION PLUS) CAPS Take 1 capsule by mouth daily.  4  . LANTUS SOLOSTAR 100 UNIT/ML Solostar Pen Inject 10 Units into the skin See admin instructions.  3  . Liraglutide (VICTOZA Alexander Foster) Inject 18 Units into the skin every morning.    . metFORMIN (GLUCOPHAGE) 500 MG tablet Take 500 mg by mouth 2 (two) times daily with a meal.     . montelukast (SINGULAIR) 10 MG tablet Take 10 mg by mouth at bedtime.    . Multiple Vitamin (MULTIVITAMIN WITH MINERALS) TABS tablet Take 1 tablet by mouth daily.    . niacin (NIASPAN) 500 MG CR tablet Take 500 mg by mouth at bedtime.    . niacin-simvastatin (SIMCOR) 500-20 MG 24 hr tablet Take 1 tablet by mouth at bedtime.    .  tamsulosin (FLOMAX) 0.4 MG CAPS capsule Take 0.4 mg by mouth daily after supper.    Alexander Foster 20-5-12.5 MG TABS Take 1 tablet by mouth daily.  2  . VICTOZA 18 MG/3ML SOPN Inject 18 Units into the skin as directed.  5   No current facility-administered medications for this visit.    REVIEW OF SYSTEMS:  A comprehensive review of systems was negative.   PHYSICAL EXAMINATION: General appearance: alert, cooperative, fatigued and no distress Head: Normocephalic, without obvious abnormality, atraumatic Neck: no adenopathy, no JVD, supple, symmetrical, trachea midline and thyroid not enlarged, symmetric, no tenderness/mass/nodules Lymph nodes: Cervical, supraclavicular, and axillary nodes normal. Resp: clear to auscultation bilaterally Back: symmetric, no curvature. ROM normal. No CVA tenderness. Cardio: regular rate and rhythm, S1, S2 normal, no murmur, click, rub or gallop GI: soft, non-tender; bowel sounds normal; no masses,  no organomegaly Extremities: extremities normal, atraumatic, no cyanosis or edema  ECOG PERFORMANCE STATUS: 0 - Asymptomatic  Blood pressure 122/70, pulse 77, temperature 98.3 F (36.8 C), temperature source Oral, resp. rate 18, height 5\' 11"  (1.803 m), weight 231 lb 14.4 oz (105.189 kg), SpO2 98 %.  LABORATORY DATA: Lab Results  Component Value Date   WBC 5.6 08/23/2014   HGB 12.5* 08/23/2014   HCT 37.4* 08/23/2014   MCV 80.7 08/23/2014   PLT 250 08/23/2014  Chemistry      Component Value Date/Time   NA 138 01/17/2009 0445   K 3.9 01/17/2009 0445   CL 106 01/17/2009 0445   CO2 25 01/17/2009 0445   BUN 10 01/17/2009 0445   CREATININE 1.15 01/17/2009 0445      Component Value Date/Time   CALCIUM 8.3* 01/17/2009 0445   ALKPHOS 56 09/27/2006 0850   AST 19 09/27/2006 0850   ALT 30 09/27/2006 0850   BILITOT 0.9 09/27/2006 0850     Iron study: Ferritin 69, serum iron 55, total iron binding capacity 300 and iron saturation 18%.  RADIOGRAPHIC  STUDIES:  ASSESSMENT AND PLAN: This is a very pleasant 64 years old [place] American male with mild anemia of unclear etiology questionable for anemia of chronic disease plus/minus iron deficiency. His CBC and iron study are unremarkable today. I recommended for the patient to continue on the oral iron tablets for now. I would see him back for followup visit in 6 months with repeat CBC, iron study and ferritin. He was advised to call immediately if he has any concerning symptoms in the interval. The patient voices understanding of current disease status and treatment options and is in agreement with the current care plan.  All questions were answered. The patient knows to call the clinic with any problems, questions or concerns. We can certainly see the patient much sooner if necessary.  Disclaimer: This note was dictated with voice recognition software. Similar sounding words can inadvertently be transcribed and may not be corrected upon review.

## 2015-02-20 ENCOUNTER — Telehealth: Payer: Self-pay | Admitting: Internal Medicine

## 2015-02-20 NOTE — Telephone Encounter (Signed)
Unable to leave message for patient to inform them of a change to their appointment ime due to provider being on call. Letter will be sent thru mail.

## 2015-02-24 ENCOUNTER — Other Ambulatory Visit: Payer: BC Managed Care – PPO

## 2015-03-03 ENCOUNTER — Ambulatory Visit: Payer: BC Managed Care – PPO | Admitting: Internal Medicine

## 2016-07-28 ENCOUNTER — Encounter (HOSPITAL_COMMUNITY): Payer: Self-pay

## 2016-07-28 ENCOUNTER — Emergency Department (HOSPITAL_COMMUNITY)
Admission: EM | Admit: 2016-07-28 | Discharge: 2016-07-29 | Disposition: A | Discharge status: Home / Self Care | Payer: BC Managed Care – PPO | Attending: Emergency Medicine | Admitting: Emergency Medicine

## 2016-07-28 DIAGNOSIS — Z8546 Personal history of malignant neoplasm of prostate: Secondary | ICD-10-CM | POA: Diagnosis not present

## 2016-07-28 DIAGNOSIS — R55 Syncope and collapse: Secondary | ICD-10-CM | POA: Diagnosis present

## 2016-07-28 DIAGNOSIS — E119 Type 2 diabetes mellitus without complications: Secondary | ICD-10-CM | POA: Diagnosis not present

## 2016-07-28 DIAGNOSIS — N179 Acute kidney failure, unspecified: Secondary | ICD-10-CM | POA: Insufficient documentation

## 2016-07-28 DIAGNOSIS — Z7982 Long term (current) use of aspirin: Secondary | ICD-10-CM | POA: Diagnosis not present

## 2016-07-28 DIAGNOSIS — E860 Dehydration: Secondary | ICD-10-CM | POA: Diagnosis not present

## 2016-07-28 DIAGNOSIS — Z7984 Long term (current) use of oral hypoglycemic drugs: Secondary | ICD-10-CM | POA: Diagnosis not present

## 2016-07-28 LAB — URINALYSIS, ROUTINE W REFLEX MICROSCOPIC
Bilirubin Urine: NEGATIVE
Glucose, UA: NEGATIVE mg/dL
Hgb urine dipstick: NEGATIVE
Ketones, ur: NEGATIVE mg/dL
LEUKOCYTES UA: NEGATIVE
NITRITE: NEGATIVE
PROTEIN: NEGATIVE mg/dL
Specific Gravity, Urine: 1.012 (ref 1.005–1.030)
pH: 5 (ref 5.0–8.0)

## 2016-07-28 LAB — BASIC METABOLIC PANEL
Anion gap: 11 (ref 5–15)
BUN: 22 mg/dL — ABNORMAL HIGH (ref 6–20)
CHLORIDE: 106 mmol/L (ref 101–111)
CO2: 23 mmol/L (ref 22–32)
CREATININE: 1.93 mg/dL — AB (ref 0.61–1.24)
Calcium: 9.2 mg/dL (ref 8.9–10.3)
GFR, EST AFRICAN AMERICAN: 40 mL/min — AB (ref >60)
GFR, EST NON AFRICAN AMERICAN: 34 mL/min — AB (ref >60)
Glucose, Bld: 152 mg/dL — ABNORMAL HIGH (ref 65–99)
Potassium: 5 mmol/L (ref 3.5–5.1)
SODIUM: 140 mmol/L (ref 135–145)

## 2016-07-28 LAB — I-STAT TROPONIN, ED: TROPONIN I, POC: 0 ng/mL (ref 0.00–0.08)

## 2016-07-28 LAB — CBC
HCT: 34.2 % — ABNORMAL LOW (ref 39.0–52.0)
Hemoglobin: 11.7 g/dL — ABNORMAL LOW (ref 13.0–17.0)
MCH: 27.2 pg (ref 26.0–34.0)
MCHC: 34.2 g/dL (ref 30.0–36.0)
MCV: 79.5 fL (ref 78.0–100.0)
PLATELETS: 260 10*3/uL (ref 150–400)
RBC: 4.3 MIL/uL (ref 4.22–5.81)
RDW: 14.1 % (ref 11.5–15.5)
WBC: 7.5 10*3/uL (ref 4.0–10.5)

## 2016-07-28 LAB — CBG MONITORING, ED: Glucose-Capillary: 151 mg/dL — ABNORMAL HIGH (ref 65–99)

## 2016-07-28 MED ORDER — SODIUM CHLORIDE 0.9 % IV BOLUS (SEPSIS)
1000.0000 mL | Freq: Once | INTRAVENOUS | Status: AC
  Administered 2016-07-28: 1000 mL via INTRAVENOUS

## 2016-07-28 MED ORDER — SODIUM CHLORIDE 0.9 % IV BOLUS (SEPSIS)
500.0000 mL | Freq: Once | INTRAVENOUS | Status: AC
  Administered 2016-07-28: 500 mL via INTRAVENOUS

## 2016-07-28 NOTE — ED Notes (Signed)
Reported off to next ED nurse, Lorn Junes.

## 2016-07-28 NOTE — Progress Notes (Signed)
Evaluated Alexander Foster who is  66 year old male with pmh HTN, DM type II, iron deficiency anemia, and prostate cancer; who appears to have had a syncopal event after being outside for July 4. No specific truama noted. Patient reports poor po intake with initial lab work suggestive of the same.  Initially, orthostatic on admission  Patient had been given 2 L of normal saline IV fluids with improvement of vital signs. Recommending giving patient additional 1/2 liter of fluids and discharged home to follow-up with PCP.

## 2016-07-28 NOTE — ED Notes (Signed)
Bed: WA03 Expected date:  Expected time:  Means of arrival:  Comments: 66 yo loss of consciousness

## 2016-07-28 NOTE — ED Provider Notes (Addendum)
Medical screening examination/treatment/procedure(s) were conducted as a shared visit with non-physician practitioner(s) and myself.  I personally evaluated the patient during the encounter.   EKG Interpretation  Date/Time:  Wednesday July 28 2016 18:33:28 EDT Ventricular Rate:  71 PR Interval:    QRS Duration: 140 QT Interval:  403 QTC Calculation: 438 R Axis:   68 Text Interpretation:  Sinus rhythm Right bundle branch block new from last tracing Confirmed by Lacretia Leigh ([id number]) on 07/28/2016 7:45:39 PM       66 year old male presents after syncopal event just prior to arrival. Patient admits to decreased oral intake as well as regular alcohol. Has evidence of aki Given IV hydration here and will be admitted   Lacretia Leigh, MD 07/28/16 2202    Lacretia Leigh, MD 07/28/16 2202

## 2016-07-28 NOTE — ED Notes (Signed)
Patient provided with [place] sandwich and ice water for dinner, per request.

## 2016-07-28 NOTE — Discharge Instructions (Signed)
Please read instructions below. Drink plenty of water to avoid dehydration. Schedule an appointment with your primary care provider for follow up on your visit today and to recheck your creatinine level. Return to the ER for another episode of fainting, chest pain, shortness of breath, or new or concerning symptoms.

## 2016-07-28 NOTE — ED Provider Notes (Signed)
[facility] DEPT Provider Note   CSN: [id number] Arrival date & time: 07/28/16  1824     History   Chief Complaint Chief Complaint  Patient presents with  . Loss of Consciousness    HPI Alexander Foster is a 66 y.o. male.  Patient with past medical history of hypertension, type 2 diabetes, anemia, presenting to ED with syncopal episode PTA. Patient states he was downtown at the [place] 4 festivities, outside for about 3 hours. He began feeling faint and went to sit down. After a minute or so he states he lost consciousness. His wife reports this syncopal episode lasted less than 15 seconds. Denies bowel or bladder incontinence during this episode. She states he thinks it is because he is dehydrated, with decreased food intake all day. He does admit to drinking one beer. He states he has had a similar syncopal episode about 2-1/2 years ago, where he was outside in the heat with decreased fluid and food intake that day. During his ED visit for this episode, he was noted to have a normal workup. He also reports a normal cardiac workup, including normal stress test.       Past Medical History:  Diagnosis Date  . Anemia   . Cancer ([place])    seed implant for prostate cancer  . Diabetes mellitus without complication ([place])   . Hypertension     Patient Active Problem List   Diagnosis Date Noted  . Deficiency anemia 05/08/2013    Past Surgical History:  Procedure Laterality Date  . RADIOACTIVE SEED IMPLANT  2010       Home Medications    Prior to Admission medications   Medication Sig Start Date End Date Taking? Authorizing Provider  ANDROGEL PUMP 20.25 MG/ACT (1.62%) GEL Apply 2 application topically daily. 07/01/16  Yes [provider]  aspirin 81 MG chewable tablet Chew 81 mg by mouth daily.   Yes [provider]  Iron-FA-B Cmp-C-Biot-Probiotic (FUSION PLUS) CAPS Take 1 capsule by mouth daily. 01/28/14  Yes [provider]  metFORMIN (GLUCOPHAGE) 1000  MG tablet Take 1,000 mg by mouth 2 (two) times daily with a meal.    Yes [provider]  Multiple Vitamin (MULTIVITAMIN WITH MINERALS) TABS tablet Take 1 tablet by mouth daily.   Yes [provider]  niacin (NIASPAN) 500 MG CR tablet Take 500 mg by mouth at bedtime.   Yes [provider]  tamsulosin (FLOMAX) 0.4 MG CAPS capsule Take 0.4 mg by mouth daily after supper.   Yes [provider]  TRESIBA FLEXTOUCH 200 UNIT/ML SOPN Inject 36 Units into the skin daily. 05/22/16  Yes [provider]  TRIBENZOR 20-5-12.5 MG TABS Take 1 tablet by mouth daily. 02/13/14  Yes [provider]  VICTOZA 18 MG/3ML SOPN Inject 18 Units into the skin daily.  08/20/14  Yes [provider]    Family History History reviewed. No pertinent family history.  Social History Social History  Substance Use Topics  . Smoking status: Never Smoker  . Smokeless tobacco: Not on file  . Alcohol use Yes     Comment: Ocassionally     Allergies   Patient has no known allergies.   Review of Systems Review of Systems  Constitutional: Negative for activity change.  HENT: Negative for trouble swallowing.   Eyes: Negative for visual disturbance.  Respiratory: Negative for shortness of breath.   Cardiovascular: Negative for chest pain.  Gastrointestinal: Negative for abdominal pain, nausea and vomiting.  Genitourinary:  No urinary incontinence  Musculoskeletal: Negative for back pain and neck pain.  Skin: Positive for wound (abrasion left elbow).  Allergic/Immunologic: Negative for immunocompromised state.  Neurological: Positive for syncope. Negative for speech difficulty, weakness, numbness and headaches.  Psychiatric/Behavioral: Negative for confusion.     Physical Exam Updated Vital Signs BP (!) 149/87   Pulse 94   Temp 98.6 F (37 C) (Oral)   Resp (!) 22   Ht 5\' 11"  (1.803 m)   Wt 99.8 kg (220 lb)   SpO2 100%   BMI 30.68 kg/m    Physical Exam  Constitutional: He is oriented to person, place, and time. He appears well-developed and well-nourished. No distress.  HENT:  Head: Normocephalic and atraumatic.  Mouth/Throat: Oropharynx is clear and moist.  Eyes: Conjunctivae and EOM are normal. Pupils are equal, round, and reactive to light.  Neck: Normal range of motion. Neck supple.  Cardiovascular: Normal rate, regular rhythm, normal heart sounds and intact distal pulses.  Exam reveals no friction rub.   No murmur heard. Pulmonary/Chest: Effort normal and breath sounds normal. No respiratory distress. He has no wheezes. He has no rales.  Abdominal: Soft. Bowel sounds are normal. He exhibits no distension and no mass. There is no tenderness. There is no rebound and no guarding. No hernia.  Musculoskeletal: Normal range of motion.  No Spinal or paraspinal tenderness. No bony step-offs. No gross deformities. Moving all extremities.  Neurological: He is alert and oriented to person, place, and time. He displays normal reflexes. No cranial nerve deficit or sensory deficit. He exhibits normal muscle tone. Coordination normal.  Normal finger to nose. Normal gait  Skin: Skin is warm.  Superficial abrasion to left elbow  Psychiatric: He has a normal mood and affect. His behavior is normal.  Nursing note and vitals reviewed.    ED Treatments / Results  Labs (all labs ordered are listed, but only abnormal results are displayed) Labs Reviewed  BASIC METABOLIC PANEL - Abnormal; Notable for the following:       Result Value   Glucose, Bld 152 (*)    BUN 22 (*)    Creatinine, Ser 1.93 (*)    GFR calc non Af Amer 34 (*)    GFR calc Af Amer 40 (*)    All other components within normal limits  CBC - Abnormal; Notable for the following:    Hemoglobin 11.7 (*)    HCT 34.2 (*)    All other components within normal limits  URINALYSIS, ROUTINE W REFLEX MICROSCOPIC - Abnormal; Notable for the following:    APPearance HAZY (*)     All other components within normal limits  CBG MONITORING, ED - Abnormal; Notable for the following:    Glucose-Capillary 151 (*)    All other components within normal limits  CBG MONITORING, ED  [name], ED    EKG  EKG Interpretation  Date/Time:  Wednesday July 28 2016 18:33:28 EDT Ventricular Rate:  71 PR Interval:    QRS Duration: 140 QT Interval:  403 QTC Calculation: 438 R Axis:   68 Text Interpretation:  Sinus rhythm Right bundle branch block new from last tracing Confirmed by Lacretia Leigh ([id number]) on 07/28/2016 7:45:39 PM       Radiology No results found.  Procedures Procedures (including critical care time)  Medications Ordered in ED Medications  sodium chloride 0.9 % bolus 1,000 mL (0 mLs Intravenous Stopped 07/28/16 2052)  sodium chloride 0.9 % bolus 1,000 mL (0 mLs Intravenous Stopped  07/28/16 2244)  sodium chloride 0.9 % bolus 500 mL (500 mLs Intravenous New Bag/Given 07/28/16 2254)     Initial Impression / Assessment and Plan / ED Course  I have reviewed the triage vital signs and the nursing notes.  Pertinent labs & imaging results that were available during my care of the patient were reviewed by me and considered in my medical decision making (see chart for details).  Clinical Course as of Jul 28 2329  Wed Jul 28, 2016  2249 Per Dr. Tamala Julian, administer another 537mL of IVF and discharge. See his note for further recommendations.  [JR]    Clinical Course User Index [JR] Russo, [place] N, PA-C    Pt w syncopal episode, likely d/t dehydration with evidence of AKI. Cr 1.93. Pt hypotensive on EMS arrival. Normal neuro exam. CBC, trop, U/A unremarkable. EKG with RBBB, unchanged from previous per [facility] documentation. IV hydration given, however pt with positive orthostatics. Hospitalists consulted for admission vs obs.   Spoke with Dr. Fuller Plan, he will come see the patient  Dr. Tamala Julian recommending against admission. He recommends 59mL of  IVF and feels patient in stable for discharge. See his note for further details.    Pt with improvement in BP since arrival. Pt hydrated with 3L total of NS. Pt agreeable to discharge with close PCP follow up. Pt seems reliable for follow up. Strict return precautions given.  Patient discussed with and seen by Dr. Zenia Resides.  Discussed results, findings, treatment and follow up. Patient advised of return precautions. Patient verbalized understanding and agreed with plan.   Final Clinical Impressions(s) / ED Diagnoses   Final diagnoses:  Dehydration  AKI (acute kidney injury) [facility])    New Prescriptions New Prescriptions   No medications on file     Russo, [place] N, PA-C 07/28/16 2331

## 2016-07-28 NOTE — ED Triage Notes (Addendum)
Patient [place]IB EMS from [place]. Patient states he was outside from approx 1430 -1730. Patient states he "didnt have much to eat or drink- just a beer and some potato chips." Patient reports he started feeling dizzy and sat down on a wall. Shortly thereafter, patient did lose consciousness for approx 15 seconds before awakening, per patient wife. Per report, patient did not hit his head or any other body part upon LOC- small abrasion to left elbow  noted in triage. Upon arrival, EMS reports patient was diaphoretic, hypotensive, and alert and oriented x4. Patient received 500ML IV NS in EMS and is normotensive in triage.

## 2016-07-28 NOTE — ED Notes (Signed)
Hospitalist at bedside 

## 2017-09-03 ENCOUNTER — Ambulatory Visit (HOSPITAL_COMMUNITY)
Admission: EM | Admit: 2017-09-03 | Discharge: 2017-09-03 | Disposition: A | Discharge status: Home / Self Care | Payer: BC Managed Care – PPO | Source: Home / Self Care

## 2017-09-03 ENCOUNTER — Emergency Department (HOSPITAL_COMMUNITY): Payer: BC Managed Care – PPO

## 2017-09-03 ENCOUNTER — Other Ambulatory Visit: Payer: Self-pay

## 2017-09-03 ENCOUNTER — Encounter (HOSPITAL_COMMUNITY): Payer: Self-pay

## 2017-09-03 ENCOUNTER — Observation Stay (HOSPITAL_COMMUNITY)
Admission: EM | Admit: 2017-09-03 | Discharge: 2017-09-05 | Disposition: A | Discharge status: Home / Self Care | Payer: BC Managed Care – PPO | Attending: Family Medicine | Admitting: Family Medicine

## 2017-09-03 DIAGNOSIS — R972 Elevated prostate specific antigen [PSA]: Secondary | ICD-10-CM | POA: Diagnosis not present

## 2017-09-03 DIAGNOSIS — A419 Sepsis, unspecified organism: Principal | ICD-10-CM | POA: Insufficient documentation

## 2017-09-03 DIAGNOSIS — K5732 Diverticulitis of large intestine without perforation or abscess without bleeding: Secondary | ICD-10-CM | POA: Insufficient documentation

## 2017-09-03 DIAGNOSIS — N289 Disorder of kidney and ureter, unspecified: Secondary | ICD-10-CM | POA: Diagnosis not present

## 2017-09-03 DIAGNOSIS — Z7951 Long term (current) use of inhaled steroids: Secondary | ICD-10-CM | POA: Diagnosis not present

## 2017-09-03 DIAGNOSIS — R350 Frequency of micturition: Secondary | ICD-10-CM | POA: Diagnosis not present

## 2017-09-03 DIAGNOSIS — E119 Type 2 diabetes mellitus without complications: Secondary | ICD-10-CM | POA: Insufficient documentation

## 2017-09-03 DIAGNOSIS — R42 Dizziness and giddiness: Secondary | ICD-10-CM | POA: Diagnosis not present

## 2017-09-03 DIAGNOSIS — R509 Fever, unspecified: Secondary | ICD-10-CM | POA: Diagnosis present

## 2017-09-03 DIAGNOSIS — I10 Essential (primary) hypertension: Secondary | ICD-10-CM | POA: Diagnosis not present

## 2017-09-03 DIAGNOSIS — Z7982 Long term (current) use of aspirin: Secondary | ICD-10-CM | POA: Diagnosis not present

## 2017-09-03 DIAGNOSIS — K5792 Diverticulitis of intestine, part unspecified, without perforation or abscess without bleeding: Secondary | ICD-10-CM | POA: Diagnosis present

## 2017-09-03 DIAGNOSIS — Z79899 Other long term (current) drug therapy: Secondary | ICD-10-CM | POA: Diagnosis not present

## 2017-09-03 DIAGNOSIS — Z794 Long term (current) use of insulin: Secondary | ICD-10-CM | POA: Diagnosis not present

## 2017-09-03 LAB — URINALYSIS, ROUTINE W REFLEX MICROSCOPIC
BACTERIA UA: NONE SEEN
BILIRUBIN URINE: NEGATIVE
Glucose, UA: NEGATIVE mg/dL
KETONES UR: NEGATIVE mg/dL
LEUKOCYTES UA: NEGATIVE
NITRITE: NEGATIVE
PH: 5 (ref 5.0–8.0)
PROTEIN: NEGATIVE mg/dL
Specific Gravity, Urine: 1.012 (ref 1.005–1.030)

## 2017-09-03 LAB — CBC WITH DIFFERENTIAL/PLATELET
Abs Immature Granulocytes: 0.1 10*3/uL (ref 0.0–0.1)
Basophils Absolute: 0 10*3/uL (ref 0.0–0.1)
Basophils Relative: 0 %
Eosinophils Absolute: 0 10*3/uL (ref 0.0–0.7)
Eosinophils Relative: 0 %
HEMATOCRIT: 38.7 % — AB (ref 39.0–52.0)
Hemoglobin: 13 g/dL (ref 13.0–17.0)
IMMATURE GRANULOCYTES: 1 %
LYMPHS ABS: 0.7 10*3/uL (ref 0.7–4.0)
LYMPHS PCT: 10 %
MCH: 26.9 pg (ref 26.0–34.0)
MCHC: 33.6 g/dL (ref 30.0–36.0)
MCV: 80 fL (ref 78.0–100.0)
MONOS PCT: 8 %
Monocytes Absolute: 0.5 10*3/uL (ref 0.1–1.0)
NEUTROS PCT: 81 %
Neutro Abs: 5.4 10*3/uL (ref 1.7–7.7)
Platelets: 215 10*3/uL (ref 150–400)
RBC: 4.84 MIL/uL (ref 4.22–5.81)
RDW: 13.8 % (ref 11.5–15.5)
WBC: 6.6 10*3/uL (ref 4.0–10.5)

## 2017-09-03 LAB — COMPREHENSIVE METABOLIC PANEL
ALT: 28 U/L (ref 0–44)
AST: 27 U/L (ref 15–41)
Albumin: 3.8 g/dL (ref 3.5–5.0)
Alkaline Phosphatase: 48 U/L (ref 38–126)
Anion gap: 12 (ref 5–15)
BUN: 18 mg/dL (ref 8–23)
CO2: 22 mmol/L (ref 22–32)
Calcium: 9.1 mg/dL (ref 8.9–10.3)
Chloride: 103 mmol/L (ref 98–111)
Creatinine, Ser: 1.58 mg/dL — ABNORMAL HIGH (ref 0.61–1.24)
GFR calc Af Amer: 50 mL/min — ABNORMAL LOW (ref >60)
GFR, EST NON AFRICAN AMERICAN: 43 mL/min — AB (ref >60)
Glucose, Bld: 158 mg/dL — ABNORMAL HIGH (ref 70–99)
POTASSIUM: 3.6 mmol/L (ref 3.5–5.1)
SODIUM: 137 mmol/L (ref 135–145)
Total Bilirubin: 0.8 mg/dL (ref 0.3–1.2)
Total Protein: 7.6 g/dL (ref 6.5–8.1)

## 2017-09-03 LAB — GLUCOSE, CAPILLARY: GLUCOSE-CAPILLARY: 184 mg/dL — AB (ref 70–99)

## 2017-09-03 LAB — I-STAT CG4 LACTIC ACID, ED: Lactic Acid, Venous: 1.41 mmol/L (ref 0.5–1.9)

## 2017-09-03 MED ORDER — INSULIN ASPART 100 UNIT/ML ~~LOC~~ SOLN
0.0000 [IU] | Freq: Three times a day (TID) | SUBCUTANEOUS | Status: DC
  Administered 2017-09-04 (×3): 2 [IU] via SUBCUTANEOUS
  Administered 2017-09-05: 3 [IU] via SUBCUTANEOUS
  Administered 2017-09-05: 1 [IU] via SUBCUTANEOUS

## 2017-09-03 MED ORDER — ACETAMINOPHEN 325 MG PO TABS
650.0000 mg | ORAL_TABLET | Freq: Four times a day (QID) | ORAL | Status: DC | PRN
  Administered 2017-09-03 – 2017-09-04 (×2): 650 mg via ORAL
  Filled 2017-09-03 (×2): qty 2

## 2017-09-03 MED ORDER — ENOXAPARIN SODIUM 40 MG/0.4ML ~~LOC~~ SOLN
40.0000 mg | SUBCUTANEOUS | Status: DC
  Administered 2017-09-03 – 2017-09-04 (×2): 40 mg via SUBCUTANEOUS
  Filled 2017-09-03 (×2): qty 0.4

## 2017-09-03 MED ORDER — AMLODIPINE BESYLATE 5 MG PO TABS
5.0000 mg | ORAL_TABLET | Freq: Every day | ORAL | Status: DC
  Administered 2017-09-04 – 2017-09-05 (×2): 5 mg via ORAL
  Filled 2017-09-03 (×2): qty 1

## 2017-09-03 MED ORDER — ACETAMINOPHEN 500 MG PO TABS
1000.0000 mg | ORAL_TABLET | Freq: Once | ORAL | Status: AC
Start: 2017-09-03 — End: 2017-09-03
  Administered 2017-09-03: 1000 mg via ORAL
  Filled 2017-09-03: qty 2

## 2017-09-03 MED ORDER — TAMSULOSIN HCL 0.4 MG PO CAPS
0.4000 mg | ORAL_CAPSULE | Freq: Every day | ORAL | Status: DC
  Administered 2017-09-04: 0.4 mg via ORAL
  Filled 2017-09-03: qty 1

## 2017-09-03 MED ORDER — HYDROCHLOROTHIAZIDE 12.5 MG PO CAPS
12.5000 mg | ORAL_CAPSULE | Freq: Every day | ORAL | Status: DC
  Administered 2017-09-04 – 2017-09-05 (×2): 12.5 mg via ORAL
  Filled 2017-09-03 (×3): qty 1

## 2017-09-03 MED ORDER — FE FUMARATE-B12-VIT C-FA-IFC PO CAPS
1.0000 | ORAL_CAPSULE | Freq: Every day | ORAL | Status: DC
  Administered 2017-09-03 – 2017-09-05 (×3): 1 via ORAL
  Filled 2017-09-03 (×4): qty 1

## 2017-09-03 MED ORDER — CIPROFLOXACIN HCL 500 MG PO TABS
500.0000 mg | ORAL_TABLET | Freq: Two times a day (BID) | ORAL | Status: DC
  Administered 2017-09-03 – 2017-09-05 (×4): 500 mg via ORAL
  Filled 2017-09-03 (×4): qty 1

## 2017-09-03 MED ORDER — ACETAMINOPHEN 650 MG RE SUPP
650.0000 mg | Freq: Four times a day (QID) | RECTAL | Status: DC | PRN
  Filled 2017-09-03: qty 1

## 2017-09-03 MED ORDER — MOMETASONE FURO-FORMOTEROL FUM 100-5 MCG/ACT IN AERO
2.0000 | INHALATION_SPRAY | Freq: Two times a day (BID) | RESPIRATORY_TRACT | Status: DC
  Administered 2017-09-03 – 2017-09-05 (×4): 2 via RESPIRATORY_TRACT
  Filled 2017-09-03: qty 8.8

## 2017-09-03 MED ORDER — INSULIN GLARGINE 100 UNIT/ML ~~LOC~~ SOLN
20.0000 [IU] | Freq: Every day | SUBCUTANEOUS | Status: DC
  Administered 2017-09-04 – 2017-09-05 (×2): 20 [IU] via SUBCUTANEOUS
  Filled 2017-09-03 (×2): qty 0.2

## 2017-09-03 MED ORDER — ONDANSETRON HCL 4 MG PO TABS: 4.0000 mg | ORAL_TABLET | Freq: Four times a day (QID) | ORAL | Status: DC | PRN

## 2017-09-03 MED ORDER — ONDANSETRON HCL 4 MG/2ML IJ SOLN: 4.0000 mg | Freq: Four times a day (QID) | INTRAMUSCULAR | Status: DC | PRN

## 2017-09-03 MED ORDER — LIRAGLUTIDE 18 MG/3ML ~~LOC~~ SOPN: 1.8000 mg | PEN_INJECTOR | Freq: Every day | SUBCUTANEOUS | Status: DC

## 2017-09-03 MED ORDER — POLYETHYLENE GLYCOL 3350 17 G PO PACK: 17.0000 g | PACK | Freq: Every day | ORAL | Status: DC | PRN

## 2017-09-03 MED ORDER — METRONIDAZOLE IN NACL 5-0.79 MG/ML-% IV SOLN
500.0000 mg | Freq: Once | INTRAVENOUS | Status: AC
  Administered 2017-09-03: 500 mg via INTRAVENOUS
  Filled 2017-09-03: qty 100

## 2017-09-03 MED ORDER — SODIUM CHLORIDE 0.9 % IV SOLN
2.0000 g | Freq: Once | INTRAVENOUS | Status: AC
  Administered 2017-09-03: 2 g via INTRAVENOUS
  Filled 2017-09-03: qty 20

## 2017-09-03 MED ORDER — ASPIRIN 81 MG PO CHEW
81.0000 mg | CHEWABLE_TABLET | Freq: Every day | ORAL | Status: DC
  Administered 2017-09-04 – 2017-09-05 (×2): 81 mg via ORAL
  Filled 2017-09-03 (×2): qty 1

## 2017-09-03 MED ORDER — OLMESARTAN-AMLODIPINE-HCTZ 20-5-12.5 MG PO TABS: 1.0000 | ORAL_TABLET | Freq: Every day | ORAL | Status: DC

## 2017-09-03 MED ORDER — METRONIDAZOLE 500 MG PO TABS
500.0000 mg | ORAL_TABLET | Freq: Three times a day (TID) | ORAL | Status: DC
  Administered 2017-09-03 – 2017-09-05 (×5): 500 mg via ORAL
  Filled 2017-09-03 (×5): qty 1

## 2017-09-03 MED ORDER — ADULT MULTIVITAMIN W/MINERALS CH
1.0000 | ORAL_TABLET | Freq: Every day | ORAL | Status: DC
  Administered 2017-09-04 – 2017-09-05 (×2): 1 via ORAL
  Filled 2017-09-03 (×3): qty 1

## 2017-09-03 MED ORDER — IRBESARTAN 150 MG PO TABS
150.0000 mg | ORAL_TABLET | Freq: Every day | ORAL | Status: DC
  Administered 2017-09-04 – 2017-09-05 (×2): 150 mg via ORAL
  Filled 2017-09-03 (×2): qty 1

## 2017-09-03 MED ORDER — SODIUM CHLORIDE 0.9 % IV BOLUS
1000.0000 mL | Freq: Once | INTRAVENOUS | Status: AC
  Administered 2017-09-03: 1000 mL via INTRAVENOUS

## 2017-09-03 MED ORDER — TRAMADOL HCL 50 MG PO TABS: 50.0000 mg | ORAL_TABLET | Freq: Four times a day (QID) | ORAL | Status: DC | PRN

## 2017-09-03 MED ORDER — MONTELUKAST SODIUM 10 MG PO TABS
10.0000 mg | ORAL_TABLET | Freq: Every day | ORAL | Status: DC
  Administered 2017-09-03 – 2017-09-04 (×2): 10 mg via ORAL
  Filled 2017-09-03 (×2): qty 1

## 2017-09-03 MED ORDER — HYDRALAZINE HCL 20 MG/ML IJ SOLN: 5.0000 mg | INTRAMUSCULAR | Status: DC | PRN

## 2017-09-03 NOTE — ED Provider Notes (Addendum)
[facility] Provider Note   CSN: [id number] Arrival date & time: 09/03/17  1413     History   Chief Complaint Chief Complaint  Patient presents with  . Polyuria  . Fever    HPI WRANGLER PENNING is a 67 y.o. male.  Patient c/o fever, 102.9 and chills, generally weak, onset yesterday. Symptoms moderate, persistent, constant, felt worse today. Went to urgent care today and was referred to ED. +urinary frequency, no dysuria. Denies headache. No neck pain or stiffness. No chest pain or discomfort. No cough or uri symptoms. Initially pt felt may have eaten some bad food, but never did have diarrhea. +nausea, no vomiting. Some lower abdominal cramping/discomfort, no severe/focal pain. No rash/skin lesions. No known ill contacts. No known tick bites/lesions.   The history is provided by the patient and the spouse.  Fever   Pertinent negatives include no chest pain, no diarrhea, no vomiting, no headaches, no sore throat and no cough.    Past Medical History:  Diagnosis Date  . Anemia   . Cancer ([place])    seed implant for prostate cancer  . Diabetes mellitus without complication ([place])   . Hypertension     Patient Active Problem List   Diagnosis Date Noted  . Deficiency anemia 05/08/2013    Past Surgical History:  Procedure Laterality Date  . RADIOACTIVE SEED IMPLANT  2010        Home Medications    Prior to Admission medications   Medication Sig Start Date End Date Taking? Authorizing Provider  ANDROGEL PUMP 20.25 MG/ACT (1.62%) GEL Apply 2 application topically daily. 07/01/16   [provider]  aspirin 81 MG chewable tablet Chew 81 mg by mouth daily.    [provider]  Iron-FA-B Cmp-C-Biot-Probiotic (FUSION PLUS) CAPS Take 1 capsule by mouth daily. 01/28/14   [provider]  metFORMIN (GLUCOPHAGE) 1000 MG tablet Take 1,000 mg by mouth 2 (two) times daily with a meal.     [provider]  Multiple  Vitamin (MULTIVITAMIN WITH MINERALS) TABS tablet Take 1 tablet by mouth daily.    [provider]  niacin (NIASPAN) 500 MG CR tablet Take 500 mg by mouth at bedtime.    [provider]  tamsulosin (FLOMAX) 0.4 MG CAPS capsule Take 0.4 mg by mouth daily after supper.    [provider]  TRESIBA FLEXTOUCH 200 UNIT/ML SOPN Inject 36 Units into the skin daily. 05/22/16   [provider]  TRIBENZOR 20-5-12.5 MG TABS Take 1 tablet by mouth daily. 02/13/14   [provider]  VICTOZA 18 MG/3ML SOPN Inject 18 Units into the skin daily.  08/20/14   [provider]    Family History History reviewed. No pertinent family history.  Social History Social History   Tobacco Use  . Smoking status: Never Smoker  . Smokeless tobacco: Never Used  Substance Use Topics  . Alcohol use: Yes    Comment: Ocassionally  . Drug use: Never     Allergies   Patient has no known allergies.   Review of Systems Review of Systems  Constitutional: Positive for chills and fever.  HENT: Negative for sore throat.   Eyes: Negative for redness.  Respiratory: Negative for cough and shortness of breath.   Cardiovascular: Negative for chest pain.  Gastrointestinal: Positive for nausea. Negative for diarrhea and vomiting.  Endocrine: Negative for polydipsia.  Genitourinary: Positive for frequency. Negative for dysuria and flank pain.  Musculoskeletal: Negative for  back pain, neck pain and neck stiffness.  Skin: Negative for rash.  Neurological: Negative for headaches.  Hematological: Does not bruise/bleed easily.  Psychiatric/Behavioral: Negative for confusion.     Physical Exam Updated Vital Signs There were no vitals taken for this visit.  Physical Exam  Constitutional: He is oriented to person, place, and time. He appears well-developed and well-nourished.  Febrile.   HENT:  Mouth/Throat: Oropharynx is clear and moist.  Eyes: Conjunctivae are normal.    Neck: Neck supple. No tracheal deviation present. No thyromegaly present.  No stiffness or rigidity.   Cardiovascular: Regular rhythm, normal heart sounds and intact distal pulses.  Tachycardic.   Pulmonary/Chest: Effort normal and breath sounds normal. No accessory muscle usage. No respiratory distress. He exhibits no tenderness.  Abdominal: Soft. Bowel sounds are normal. He exhibits no distension. There is tenderness.  Lower abd tenderness.   Genitourinary:  Genitourinary Comments: No cva tenderness. Normal external gu exam. No scrotal or testicular pain, swelling, or tenderness.   Musculoskeletal: He exhibits no edema or tenderness.  CTLS spine, non tender, aligned, no step off. No focal extremity swelling or tenderness on exam.   Lymphadenopathy:    He has no cervical adenopathy.  Neurological: He is alert and oriented to person, place, and time.  Skin: Skin is warm and dry. No rash noted.  No skin lesions/rash. No ticks seen. No cellulitis.  Psychiatric: He has a normal mood and affect.  Nursing note and vitals reviewed.    ED Treatments / Results  Labs (all labs ordered are listed, but only abnormal results are displayed) Results for orders placed or performed during the hospital encounter of 09/03/17  Comprehensive metabolic panel  Result Value Ref Range   Sodium 137 135 - 145 mmol/L   Potassium 3.6 3.5 - 5.1 mmol/L   Chloride 103 98 - 111 mmol/L   CO2 22 22 - 32 mmol/L   Glucose, Bld 158 (H) 70 - 99 mg/dL   BUN 18 8 - 23 mg/dL   Creatinine, Ser 1.58 (H) 0.61 - 1.24 mg/dL   Calcium 9.1 8.9 - 10.3 mg/dL   Total Protein 7.6 6.5 - 8.1 g/dL   Albumin 3.8 3.5 - 5.0 g/dL   AST 27 15 - 41 U/L   ALT 28 0 - 44 U/L   Alkaline Phosphatase 48 38 - 126 U/L   Total Bilirubin 0.8 0.3 - 1.2 mg/dL   GFR calc non Af Amer 43 (L) >60 mL/min   GFR calc Af Amer 50 (L) >60 mL/min   Anion gap 12 5 - 15  CBC WITH DIFFERENTIAL  Result Value Ref Range   WBC 6.6 4.0 - 10.5 K/uL   RBC  4.84 4.22 - 5.81 MIL/uL   Hemoglobin 13.0 13.0 - 17.0 g/dL   HCT 38.7 (L) 39.0 - 52.0 %   MCV 80.0 78.0 - 100.0 fL   MCH 26.9 26.0 - 34.0 pg   MCHC 33.6 30.0 - 36.0 g/dL   RDW 13.8 11.5 - 15.5 %   Platelets 215 150 - 400 K/uL   Neutrophils Relative % 81 %   Neutro Abs 5.4 1.7 - 7.7 K/uL   Lymphocytes Relative 10 %   Lymphs Abs 0.7 0.7 - 4.0 K/uL   Monocytes Relative 8 %   Monocytes Absolute 0.5 0.1 - 1.0 K/uL   Eosinophils Relative 0 %   Eosinophils Absolute 0.0 0.0 - 0.7 K/uL   Basophils Relative 0 %   Basophils Absolute 0.0 0.0 -  0.1 K/uL   Immature Granulocytes 1 %   Abs Immature Granulocytes 0.1 0.0 - 0.1 K/uL  Urinalysis, Routine w reflex microscopic  Result Value Ref Range   Color, Urine YELLOW YELLOW   APPearance CLEAR CLEAR   Specific Gravity, Urine 1.012 1.005 - 1.030   pH 5.0 5.0 - 8.0   Glucose, UA NEGATIVE NEGATIVE mg/dL   Hgb urine dipstick SMALL (A) NEGATIVE   Bilirubin Urine NEGATIVE NEGATIVE   Ketones, ur NEGATIVE NEGATIVE mg/dL   Protein, ur NEGATIVE NEGATIVE mg/dL   Nitrite NEGATIVE NEGATIVE   Leukocytes, UA NEGATIVE NEGATIVE   RBC / HPF 0-5 0 - 5 RBC/hpf   Bacteria, UA NONE SEEN NONE SEEN   Squamous Epithelial / LPF 0-5 0 - 5   Mucus PRESENT   I-Stat CG4 Lactic Acid, ED  (not at  [facility])  Result Value Ref Range   Lactic Acid, Venous 1.41 0.5 - 1.9 mmol/L    EKG EKG Interpretation  Date/Time:  Saturday September 03 2017 14:24:02 EDT Ventricular Rate:  126 PR Interval:    QRS Duration: 142 QT Interval:  316 QTC Calculation: 458 R Axis:   71 Text Interpretation:  Sinus tachycardia Right bundle branch block Non-specific ST-t changes Confirmed by Lajean Saver [phone]) on 09/03/2017 2:44:56 PM   Radiology Dg Chest Port 1 View  Result Date: 09/03/2017 CLINICAL DATA:  Polyuria, dizziness, unsteadiness, nausea, fever EXAM: PORTABLE CHEST 1 VIEW COMPARISON:  09/27/2006 FINDINGS: Lungs are clear.  No pleural effusion or pneumothorax. The heart is normal in  size. IMPRESSION: Normal chest radiographs. Electronically Signed   By: Julian Hy M.D.   On: 09/03/2017 14:59    Procedures Procedures (including critical care time)  Medications Ordered in ED Medications - No data to display   Initial Impression / Assessment and Plan / ED Course  I have reviewed the triage vital signs and the nursing notes.  Pertinent labs & imaging results that were available during my care of the patient were reviewed by me and considered in my medical decision making (see chart for details).  Iv ns bolus. Stat labs sent. Cultures. Monitor. o2 [place].   Code sepsis activated. No clear source, ua pending - as lactate normal, bp normal, will hold on abx until labs back/possible source.   Reviewed nursing notes and prior charts for additional history.   Labs reviewed - lactate normal. Wbc normal. UA and additional labs pending.   cxr reviewed - no pna.   Acetaminophen po for fever. Po fluids.   1550 - signed out to Dr Sedonia Small to check ct/labs, recheck pt and dispo appropriately.     Final Clinical Impressions(s) / ED Diagnoses   Final diagnoses:  None    ED Discharge Orders    None           Lajean Saver, MD 09/03/17 1556

## 2017-09-03 NOTE — ED Notes (Signed)
Pt c/o polyuria with fever, dizziness, "at times lightheadedness and feeling "unstable" on his feet, nausea since yesterday.  Discussed with De Hollingshead, NP.  Instructed pt & spouse to go to ED.  Both verbalized understanding.  Aaron Edelman, ED First Nurse notified.

## 2017-09-03 NOTE — ED Notes (Signed)
Patient transported to CT 

## 2017-09-03 NOTE — ED Provider Notes (Signed)
  Provider Note MRN:  [id number]  Arrival date & time: 09/03/17    ED Course and Medical Decision Making  I received sign out for this patient at shift change from Dr. Ashok Cordia.  CT reveals likely diverticulitis, with radiology commenting on the need for colonoscopy for further evaluation of possible malignancy.  Abdomen remains soft and largely nontender.  Admitted to family medicine for further care and evaluation.  Given IV ceftriaxone and Flagyl in the ED.  Barth Kirks. Sedonia Small, [place] [email or web address]    Maudie Flakes, MD 09/03/17 2008

## 2017-09-03 NOTE — Discharge Summary (Signed)
[facility] Discharge Summary  Patient name: Alexander Foster Medical record number: [id number] Date of birth: 01-24-1951 Age: 67 y.o. Gender: male Date of Admission: 09/03/2017  Date of Discharge: 09/05/2017 Admitting Physician: Alexander Feil, MD  Primary Care Provider: Lucianne Lei, MD Consultants: None  Indication for Hospitalization: Sepsis 2/2 diverticulitis  Discharge Diagnoses/Problem List:  Sepsis likely secondary to diverticulitis Type 2 diabetes Hypertension History of elevated PSA Seasonal allergies Pain control  Disposition: Discharge home  Discharge Condition: Stable  Discharge Exam:  General: Alert and cooperative and appears to be in no acute distress HEENT: Neck non-tender without lymphadenopathy, masses or thyromegaly Cardio: Normal A1 and S2, no S3 or S4. RRR. No murmurs or rubs.   Pulm: Clear to auscultation bilaterally, no crackles, wheezing, or diminished breath sounds. Normal respiratory effort Abdomen: Bowel sounds normal. Abdomen soft and non-tender.  Extremities: No peripheral edema. Warm/ well perfused.  Strong radial pulses.  Neuro: Cranial nerves grossly intact  Brief Hospital Course:  Alexander Foster a 67 y.o.male whopresented on 8/10 with polyuria and lightheadedness. PMH is significant forT2DMw/ long term insulin use,elevated PSAw/ brachytherapyseeds,diverticulitis, severe environmentalallergy.  Alexander Foster presented to the ED with signs of sepsis including fever to 102.7 tachycardia to 120, tachypnea into the 20s, hypertension to 160s over 90s.  His physical exam and history were largely unremarkable except for mild left lower quadrant stomach pain.  His admission labs were largely unremarkable including a WBC of 6.6.  CT abdomen was remarkable for thickening and enlargement of the sigmoid colon with fat stranding in an area with direct diverticula suspicious for diverticulitis(other findings of the CT abdomen  can be seen below and will require outpatient work-up).  At that time he was started on Ciprofloxacin and metronidazole.  He will need to come complete a 7-day course of each.  On 8/11 patient continued to be febrile up to 100.5 and he was kept and observed for an additional night.  On 8/12 patient was afebrile with stable vital signs and felt well.  He was deemed to be in stable condition and sent home on oral antibiotics.  Issues for Follow Up:  1. Needs outpatient colonoscopy to r/o cancer-see CT findings below 2.  Follow-up with your PCP to ensure the resolution of your symptoms and completion of antibiotics.  Last day of Cipro 8/17.  Last day of metronidazole 8/18.  Significant Procedures: None  Significant Labs and Imaging:  Recent Labs  Lab 09/03/17 1437 09/04/17 0530 09/05/17 0459  WBC 6.6 5.7 5.0  HGB 13.0 12.3* 11.9*  HCT 38.7* 36.8* 35.5*  PLT 215 185 183   Recent Labs  Lab 09/03/17 1437 09/04/17 0530 09/05/17 0459  NA 137 138 140  K 3.6 3.5 3.5  CL 103 103 104  CO2 22 21* 25  GLUCOSE 158* 139* 162*  BUN 18 15 17   CREATININE 1.58* 1.49* 1.45*  CALCIUM 9.1 8.7* 8.8*  ALKPHOS 48  --   --   AST 27  --   --   ALT 28  --   --   ALBUMIN 3.8  --   --     Ct Abdomen Pelvis Wo Contrast  Result Date: 09/03/2017 CLINICAL DATA:  Urinary frequency and fever. General weakness. Nausea. EXAM: CT ABDOMEN AND PELVIS WITHOUT CONTRAST TECHNIQUE: Multidetector CT imaging of the abdomen and pelvis was performed following the standard protocol without IV contrast. COMPARISON:  None. FINDINGS: Lower chest: No acute abnormality. Hepatobiliary: No focal liver abnormality  is seen. No gallstones, gallbladder wall thickening, or biliary dilatation. Pancreas: Unremarkable. No pancreatic ductal dilatation or surrounding inflammatory changes. Spleen: Normal in size without focal abnormality. Adrenals/Urinary Tract: There is a myelolipoma in the right adrenal gland. No other renal masses. Increased  attenuation in the fat adjacent to the adrenal glands. No renal stones, perinephric stranding, or hydronephrosis. No ureterectasis or ureteral stones. Bladder is poorly distended. Mild bladder wall prominence may be due to poor distention. The bladder is otherwise normal. Stomach/Bowel: Distal esophagus, stomach, and small bowel are normal. Scattered colonic diverticuli are identified. There is enlargement and thickening in the proximal sigmoid colon as seen on series 3, image 62. There is mild increased attenuation in the adjacent fat. No abscess in this region identified. No extraluminal gas identified. Other than diverticuli, the remainder of the colon is unremarkable. The appendix is normal. Vascular/Lymphatic: Minimal atherosclerotic change in the nonaneurysmal aorta. No adenopathy. Reproductive: Brachytherapy seeds seen in the prostate. Other: No free air or free fluid. Musculoskeletal: No acute or significant osseous findings. IMPRESSION: 1. Thickening and enlargement of the sigmoid colon with mild adjacent fat stranding. There are diverticuli in this region. Acute on chronic diverticulitis could possibly explain these findings. However, an underlying mass/malignancy is not excluded. Recommend colonoscopy when the patient is stable to evaluate the underlying colon. 2. Mild fat stranding around the adrenal glands is of uncertain chronicity or significance. 3. Myelolipoma in the right adrenal gland. 4. Mild bladder wall prominence may be due to poor bladder distention. If there is concern for cystitis, recommend correlation with urinalysis. 5. Minimal atherosclerotic change in the nonaneurysmal aorta. 6. Brachytherapy seeds in the prostate. Electronically Signed   By: Dorise Bullion III M.D   On: 09/03/2017 17:50   Dg Chest Port 1 View  Result Date: 09/03/2017 CLINICAL DATA:  Polyuria, dizziness, unsteadiness, nausea, fever EXAM: PORTABLE CHEST 1 VIEW COMPARISON:  09/27/2006 FINDINGS: Lungs are clear.  No  pleural effusion or pneumothorax. The heart is normal in size. IMPRESSION: Normal chest radiographs. Electronically Signed   By: Julian Hy M.D.   On: 09/03/2017 14:59     Results/Tests Pending at Time of Discharge:  8/10 - 2 blood cultures with no growth to date for 1 day  Discharge Medications:  Allergies as of 09/05/2017   No Known Allergies     Medication List    TAKE these medications   ANDROGEL PUMP 20.25 MG/ACT (1.62%) Gel Generic drug:  Testosterone Apply 2 application topically daily.   aspirin 81 MG chewable tablet Chew 81 mg by mouth daily.   ciprofloxacin 500 MG tablet Commonly known as:  CIPRO Take 1 tablet (500 mg total) by mouth 2 (two) times daily for 5 days.   FUSION PLUS Caps Take 1 capsule by mouth daily.   metFORMIN 1000 MG tablet Commonly known as:  GLUCOPHAGE Take 1,000 mg by mouth 2 (two) times daily with a meal.   metroNIDAZOLE 500 MG tablet Commonly known as:  FLAGYL Take 1 tablet (500 mg total) by mouth every 8 (eight) hours for 6 days.   montelukast 10 MG tablet Commonly known as:  SINGULAIR Take 10 mg by mouth daily.   multivitamin with minerals Tabs tablet Take 1 tablet by mouth daily.   niacin 500 MG CR tablet Commonly known as:  NIASPAN Take 500 mg by mouth at bedtime.   SYMBICORT 80-4.5 MCG/ACT inhaler Generic drug:  budesonide-formoterol Inhale 1 puff into the lungs daily.   tamsulosin 0.4 MG Caps capsule Commonly  known as:  FLOMAX Take 0.4 mg by mouth daily after supper.   TRESIBA FLEXTOUCH 200 UNIT/ML Sopn Generic drug:  Insulin Degludec Inject 40 Units into the skin daily.   TRIBENZOR 20-5-12.5 MG Tabs Generic drug:  Olmesartan-amLODIPine-HCTZ Take 1 tablet by mouth daily.   VICTOZA 18 MG/3ML Sopn Generic drug:  liraglutide Inject 18 Units into the skin daily.       Discharge Instructions: Please refer to Patient Instructions section of EMR for full details.  Patient was counseled important signs and  symptoms that should prompt return to medical care, changes in medications, dietary instructions, activity restrictions, and follow up appointments.   Follow-Up Appointments:   Matilde Haymaker, MD 09/05/2017, 8:47 PM PGY-1, [place]

## 2017-09-03 NOTE — H&P (Addendum)
[facility] Admission History and Physical Service Pager: [phone]  Patient name: Alexander Foster Medical record number: [id number] Date of birth: October 25, 1950 Age: 67 y.o. Gender: male  Primary Care Provider: Lucianne Lei, MD Consultants:  Code Status: full  Chief Complaint: polyuria and lightheadedness  Assessment and Plan: Matix SPERL is a 67 y.o. male presenting with polyuria and lightheadedness. PMH is significant for T2DM w/ long term insulin use, elevated PSA w/ brachytherapy seeds, diverticulitis, severe environmental allergy  Code Sepsis presentation likely 2/2 divertiulitis: Patient presented with fever to 102.7, tachy 120s, and tachypneic 20s, BP was HTN to 160s/90s.  He had been endorsing fevers/chills and attributed this to ground [place] meal from Thursday.  Leading etiology is abdominal, report from ED included abdominal pain but patient was denying on admission exam.  Pneumonia unlikely given no respiratory complaints, stable on RA and CXR neg.  UTI less likely given neg urine although he did claim polyuria but denied any pain/odor with urination.   Was initially claiming lightheadedness but that had resolved by the time of admission exam.  S/p 1L NS bolus, 1x CTX and metronidazole in ED.  CT ab with diverticulitis could not r/o malignancy.  Uses home probiotic -admit for obs to med-surg, Dr. Gwendlyn Deutscher -cipro (8/10- ) plan 7 days -metronidazole (8/10- ) plan 7 days -mIVF NS  -home probiotic. -f/u blood cx -will need outpatient colonoscopy (patient wants to use Dr. Collene Mares) to r/o malignancy -I/O  T2DM: home  tresiba 40u daily and victoza 1.8u daily.  Last A1C in epic was 15 in 2019. -order  A1C -half home dose tresiba -home victoza -sSSI  Hx of elevated PSA.  Per patient, no actual diagnosis of prostate cancer but was treated w/ bachytherapy seeds.   -no intervention required at this time -home flomax  Hx of environmental/seasonal allergies.   Patient claims history of allergies treated with "lots of shots" when he was younger, never has had albuterol inhaler but has home singulair and symbicort -home singulair -dulera instead of symbicort  HTN: home tribenzor.  sbp 160s on admission -hydralazine prn  Pain control: -tramadol prn  FEN/GI: carb modified, miralax Prophylaxis: lovenox  Disposition: likely home 8/11 if feeling well  History of Present Illness:  Alexander Foster is a 67 y.o. male presenting with lightheadedness and polyuria.  Thursday thought he ate bad food.  It was ground [place] that had been in the freezer "forever" and thawed overnight in the fridge.  On Friday was having constipation and constant belly pain.   Saturday constipation and belly pain resolved but he was having polyuria without pain, felt woozy and lightheaded so he came into the ED.  He did not fall.  He also reports some subjective fevers/chills Saturday.  He felt much better by admission exam.  He did not change any medications during this episode.  No diarrhea. Denies current belly pain. No recent blood in stool.  No chest pain, no LOC, no SOB.  Review Of Systems: Per HPI with the following additions:   ROS  Patient Active Problem List   Diagnosis Date Noted  . Deficiency anemia 05/08/2013    Past Medical History: Past Medical History:  Diagnosis Date  . Anemia   . Cancer ([place])    seed implant for prostate cancer  . Diabetes mellitus without complication ([place])   . Hypertension     Past Surgical History: Past Surgical History:  Procedure Laterality Date  . RADIOACTIVE SEED IMPLANT  2010  Social History: Social History   Tobacco Use  . Smoking status: Never Smoker  . Smokeless tobacco: Never Used  Substance Use Topics  . Alcohol use: Yes    Comment: Ocassionally  . Drug use: Never   Additional social history:   Please also refer to relevant sections of EMR.  Family History: History reviewed. No pertinent family  history.   Allergies and Medications: No Known Allergies No current facility-administered medications on file prior to encounter.    Current Outpatient Medications on File Prior to Encounter  Medication Sig Dispense Refill  . ANDROGEL PUMP 20.25 MG/ACT (1.62%) GEL Apply 2 application topically daily.  5  . aspirin 81 MG chewable tablet Chew 81 mg by mouth daily.    . Iron-FA-B Cmp-C-Biot-Probiotic (FUSION PLUS) CAPS Take 1 capsule by mouth daily.  4  . metFORMIN (GLUCOPHAGE) 1000 MG tablet Take 1,000 mg by mouth 2 (two) times daily with a meal.     . montelukast (SINGULAIR) 10 MG tablet Take 10 mg by mouth daily.  1  . Multiple Vitamin (MULTIVITAMIN WITH MINERALS) TABS tablet Take 1 tablet by mouth daily.    . niacin (NIASPAN) 500 MG CR tablet Take 500 mg by mouth at bedtime.    . SYMBICORT 80-4.5 MCG/ACT inhaler Inhale 1 puff into the lungs daily.    . tamsulosin (FLOMAX) 0.4 MG CAPS capsule Take 0.4 mg by mouth daily after supper.    . TRESIBA FLEXTOUCH 200 UNIT/ML SOPN Inject 40 Units into the skin daily.   4  . TRIBENZOR 20-5-12.5 MG TABS Take 1 tablet by mouth daily.  2  . VICTOZA 18 MG/3ML SOPN Inject 18 Units into the skin daily.   5    Objective: BP (!) 143/86   Pulse 95   Temp 99.3 F (37.4 C) (Oral)   Resp 15   Ht 5\' 11"  (1.803 m)   Wt 99.8 kg   SpO2 98%   BMI 30.68 kg/m  Exam: General: NAD, pleasant Eyes: clear sclera ENTM: no epistaxis, MMM Cardiovascular: RRR, no murmurs, cap refill ~2 Respiratory: CTAB, no wheezes/crackles/cough Gastrointestinal: no ttp, soft/obese belly, bowel sounds intact MSK: able to shift weight around bed without pain/assistance.  No deficits noted Derm: no lesions to exposed skin Neuro: no gross deficits Psych: appropriate thought process during interview  Labs and Imaging: CBC BMET  Recent Labs  Lab 09/03/17 1437  WBC 6.6  HGB 13.0  HCT 38.7*  PLT 215   Recent Labs  Lab 09/03/17 1437  NA 137  K 3.6  CL 103  CO2 22   BUN 18  CREATININE 1.58*  GLUCOSE 158*  CALCIUM 9.1     Ct Abdomen Pelvis Wo Contrast  Result Date: 09/03/2017 CLINICAL DATA:  Urinary frequency and fever. General weakness. Nausea. EXAM: CT ABDOMEN AND PELVIS WITHOUT CONTRAST TECHNIQUE: Multidetector CT imaging of the abdomen and pelvis was performed following the standard protocol without IV contrast. COMPARISON:  None. FINDINGS: Lower chest: No acute abnormality. Hepatobiliary: No focal liver abnormality is seen. No gallstones, gallbladder wall thickening, or biliary dilatation. Pancreas: Unremarkable. No pancreatic ductal dilatation or surrounding inflammatory changes. Spleen: Normal in size without focal abnormality. Adrenals/Urinary Tract: There is a myelolipoma in the right adrenal gland. No other renal masses. Increased attenuation in the fat adjacent to the adrenal glands. No renal stones, perinephric stranding, or hydronephrosis. No ureterectasis or ureteral stones. Bladder is poorly distended. Mild bladder wall prominence may be due to poor distention. The bladder is otherwise normal.  Stomach/Bowel: Distal esophagus, stomach, and small bowel are normal. Scattered colonic diverticuli are identified. There is enlargement and thickening in the proximal sigmoid colon as seen on series 3, image 62. There is mild increased attenuation in the adjacent fat. No abscess in this region identified. No extraluminal gas identified. Other than diverticuli, the remainder of the colon is unremarkable. The appendix is normal. Vascular/Lymphatic: Minimal atherosclerotic change in the nonaneurysmal aorta. No adenopathy. Reproductive: Brachytherapy seeds seen in the prostate. Other: No free air or free fluid. Musculoskeletal: No acute or significant osseous findings. IMPRESSION: 1. Thickening and enlargement of the sigmoid colon with mild adjacent fat stranding. There are diverticuli in this region. Acute on chronic diverticulitis could possibly explain these  findings. However, an underlying mass/malignancy is not excluded. Recommend colonoscopy when the patient is stable to evaluate the underlying colon. 2. Mild fat stranding around the adrenal glands is of uncertain chronicity or significance. 3. Myelolipoma in the right adrenal gland. 4. Mild bladder wall prominence may be due to poor bladder distention. If there is concern for cystitis, recommend correlation with urinalysis. 5. Minimal atherosclerotic change in the nonaneurysmal aorta. 6. Brachytherapy seeds in the prostate. Electronically Signed   By: Dorise Bullion III M.D   On: 09/03/2017 17:50   Dg Chest Port 1 View  Result Date: 09/03/2017 CLINICAL DATA:  Polyuria, dizziness, unsteadiness, nausea, fever EXAM: PORTABLE CHEST 1 VIEW COMPARISON:  09/27/2006 FINDINGS: Lungs are clear.  No pleural effusion or pneumothorax. The heart is normal in size. IMPRESSION: Normal chest radiographs. Electronically Signed   By: Julian Hy M.D.   On: 09/03/2017 14:59    Sherene Sires, DO 09/03/2017, 7:46 PM PGY-2, [place] Intern pager: [phone], text pages welcome

## 2017-09-03 NOTE — ED Notes (Signed)
Admitting at bedside 

## 2017-09-03 NOTE — ED Notes (Signed)
Pt wife is going home to let dog out, will be back , her number is [phone].

## 2017-09-03 NOTE — ED Notes (Signed)
RN asked MD Ashok Cordia if antibiotics would be ordered because a Code Sepsis was called. MD Ashok Cordia said no antibiotics will be ordered at this time.

## 2017-09-03 NOTE — ED Triage Notes (Signed)
Pt sent from u/c.  Onset yesterday polyuria, dizziness, feeling unstable on feet, and nausea.  Fever noted at urgent care, not sure what result is.  Pt thinks symptoms are coming from eat " bad food" the 2 nights ago.

## 2017-09-03 NOTE — ED Notes (Signed)
Dr. Steinl at bedside 

## 2017-09-03 NOTE — ED Notes (Signed)
Pt aware of need for urine sample, urinal at bedside 

## 2017-09-04 ENCOUNTER — Other Ambulatory Visit: Payer: Self-pay

## 2017-09-04 DIAGNOSIS — N289 Disorder of kidney and ureter, unspecified: Secondary | ICD-10-CM

## 2017-09-04 DIAGNOSIS — A419 Sepsis, unspecified organism: Secondary | ICD-10-CM | POA: Diagnosis not present

## 2017-09-04 DIAGNOSIS — K5792 Diverticulitis of intestine, part unspecified, without perforation or abscess without bleeding: Secondary | ICD-10-CM | POA: Diagnosis not present

## 2017-09-04 LAB — BASIC METABOLIC PANEL
Anion gap: 14 (ref 5–15)
BUN: 15 mg/dL (ref 8–23)
CO2: 21 mmol/L — ABNORMAL LOW (ref 22–32)
Calcium: 8.7 mg/dL — ABNORMAL LOW (ref 8.9–10.3)
Chloride: 103 mmol/L (ref 98–111)
Creatinine, Ser: 1.49 mg/dL — ABNORMAL HIGH (ref 0.61–1.24)
GFR calc Af Amer: 54 mL/min — ABNORMAL LOW (ref >60)
GFR calc non Af Amer: 46 mL/min — ABNORMAL LOW (ref >60)
Glucose, Bld: 139 mg/dL — ABNORMAL HIGH (ref 70–99)
Potassium: 3.5 mmol/L (ref 3.5–5.1)
Sodium: 138 mmol/L (ref 135–145)

## 2017-09-04 LAB — GLUCOSE, CAPILLARY
GLUCOSE-CAPILLARY: 151 mg/dL — AB (ref 70–99)
GLUCOSE-CAPILLARY: 185 mg/dL — AB (ref 70–99)
GLUCOSE-CAPILLARY: 209 mg/dL — AB (ref 70–99)
Glucose-Capillary: 175 mg/dL — ABNORMAL HIGH (ref 70–99)

## 2017-09-04 LAB — CBC
HCT: 36.8 % — ABNORMAL LOW (ref 39.0–52.0)
Hemoglobin: 12.3 g/dL — ABNORMAL LOW (ref 13.0–17.0)
MCH: 26.7 pg (ref 26.0–34.0)
MCHC: 33.4 g/dL (ref 30.0–36.0)
MCV: 80 fL (ref 78.0–100.0)
Platelets: 185 10*3/uL (ref 150–400)
RBC: 4.6 MIL/uL (ref 4.22–5.81)
RDW: 13.5 % (ref 11.5–15.5)
WBC: 5.7 10*3/uL (ref 4.0–10.5)

## 2017-09-04 LAB — HIV ANTIBODY (ROUTINE TESTING W REFLEX): HIV SCREEN 4TH GENERATION: NONREACTIVE

## 2017-09-04 LAB — LACTIC ACID, PLASMA: Lactic Acid, Venous: 1 mmol/L (ref 0.5–1.9)

## 2017-09-04 NOTE — Progress Notes (Signed)
Family Medicine Teaching Service Daily Progress Note Intern Pager: [phone]  Patient name: Alexander Foster Medical record number: [id number] Date of birth: 04-26-1950 Age: 67 y.o. Gender: male  Primary Care Provider: Lucianne Lei, MD Consultants:  Code Status: full  Pt Overview and Major Events to Date:  8/11 admitted  Assessment and Plan: Alexander Foster is a 67 y.o. male presenting with polyuria and lightheadedness. PMH is significant for T2DM w/ long term insulin use, elevated PSA w/ brachytherapy seeds, diverticulitis, severe environmental allergy  Sepsis presentation likely 2/2 divertiulitis: Feeling well on morning exam Patient with fever to 102.6 overnight 8/10 despite tylenol and abx since 6pm, tachy 110s, and tachypneic to 21 on a single check, BP was HTN to 160s/90s.   Leading differential is abdominal w/ CT ab read as diverticulitis that could not r/o malignancy.  Pneumonia unlikely given no respiratory complaints, stable on RA and CXR neg.  UTI less likely given neg urine although he did claim polyuria but denied any pain/odor with urination.   S/p 1L NS bolus, 1x CTX and metronidazole in ED. Lactic acid 1.41.  Uses home probiotic -cipro (8/10- ) plan 7 days -metronidazole (8/10- ) plan 7 days -home probiotic. -f/u blood cx -will need outpatient colonoscopy (patient wants to use Dr. Collene Mares) to r/o malignancy  T2DM: home  tresiba 40u daily and victoza 1.8u daily.  Last A1C in epic was 15 in 2019. -order  A1C -half home dose tresiba -home victoza -sSSI  Hx of elevated PSA.  Per patient, no actual diagnosis of prostate cancer but was treated w/ bachytherapy seeds.   -no intervention required at this time -home flomax  Hx of environmental/seasonal allergies.  Patient claims history of allergies treated with "lots of shots" when he was younger, never has had albuterol inhaler but has home singulair and symbicort -home singulair -dulera instead of symbicort  HTN: home  tribenzor. HTN to 160s overnight, but did not take tribenzor on Saturday. Reports home sbp ~120s usually -will start components of tribenzor individually 8/11 -hydralazine prn -holding mIVF out of concern for HTN  Pain control: -tramadol prn  FEN/GI: carb modified, miralax Prophylaxis: lovenox  Disposition: may need to stay to 8/12 if fever doesn't resolve for culture results  Subjective:  Felt fever/chills about 2am when 102.6 but felt fine on AM rounds @6am   Objective: Temp:  [99.3 F (37.4 C)-102.7 F (39.3 C)] 102.6 F (39.2 C) (08/10 2342) Pulse Rate:  [92-128] 112 (08/10 2151) Resp:  [15-24] 18 (08/10 2151) BP: (129-178)/(75-98) 160/91 (08/10 2151) SpO2:  [94 %-100 %] 98 % (08/10 2151) Weight:  [99.8 kg-100 kg] 100 kg (08/10 2151) Physical Exam: General: well appearing, NAD Cardiovascular: Tachy to low 100s, regular rhythm, no murmurs Respiratory: CTAB, no IWB, no wheezes/crackles Abdomen: soft, obese, no distention, no pain to palpation able to sit without assistance Extremities: no edema, moving around bed at will  Laboratory: Recent Labs  Lab 09/03/17 1437  WBC 6.6  HGB 13.0  HCT 38.7*  PLT 215   Recent Labs  Lab 09/03/17 1437  NA 137  K 3.6  CL 103  CO2 22  BUN 18  CREATININE 1.58*  CALCIUM 9.1  PROT 7.6  BILITOT 0.8  ALKPHOS 48  ALT 28  AST 27  GLUCOSE 158*    Imaging/Diagnostic Tests: Ct Abdomen Pelvis Wo Contrast  Result Date: 09/03/2017 CLINICAL DATA:  Urinary frequency and fever. General weakness. Nausea. EXAM: CT ABDOMEN AND PELVIS WITHOUT CONTRAST TECHNIQUE: Multidetector CT imaging of  the abdomen and pelvis was performed following the standard protocol without IV contrast. COMPARISON:  None. FINDINGS: Lower chest: No acute abnormality. Hepatobiliary: No focal liver abnormality is seen. No gallstones, gallbladder wall thickening, or biliary dilatation. Pancreas: Unremarkable. No pancreatic ductal dilatation or surrounding inflammatory  changes. Spleen: Normal in size without focal abnormality. Adrenals/Urinary Tract: There is a myelolipoma in the right adrenal gland. No other renal masses. Increased attenuation in the fat adjacent to the adrenal glands. No renal stones, perinephric stranding, or hydronephrosis. No ureterectasis or ureteral stones. Bladder is poorly distended. Mild bladder wall prominence may be due to poor distention. The bladder is otherwise normal. Stomach/Bowel: Distal esophagus, stomach, and small bowel are normal. Scattered colonic diverticuli are identified. There is enlargement and thickening in the proximal sigmoid colon as seen on series 3, image 62. There is mild increased attenuation in the adjacent fat. No abscess in this region identified. No extraluminal gas identified. Other than diverticuli, the remainder of the colon is unremarkable. The appendix is normal. Vascular/Lymphatic: Minimal atherosclerotic change in the nonaneurysmal aorta. No adenopathy. Reproductive: Brachytherapy seeds seen in the prostate. Other: No free air or free fluid. Musculoskeletal: No acute or significant osseous findings. IMPRESSION: 1. Thickening and enlargement of the sigmoid colon with mild adjacent fat stranding. There are diverticuli in this region. Acute on chronic diverticulitis could possibly explain these findings. However, an underlying mass/malignancy is not excluded. Recommend colonoscopy when the patient is stable to evaluate the underlying colon. 2. Mild fat stranding around the adrenal glands is of uncertain chronicity or significance. 3. Myelolipoma in the right adrenal gland. 4. Mild bladder wall prominence may be due to poor bladder distention. If there is concern for cystitis, recommend correlation with urinalysis. 5. Minimal atherosclerotic change in the nonaneurysmal aorta. 6. Brachytherapy seeds in the prostate. Electronically Signed   By: Dorise Bullion III M.D   On: 09/03/2017 17:50   Dg Chest Port 1  View  Result Date: 09/03/2017 CLINICAL DATA:  Polyuria, dizziness, unsteadiness, nausea, fever EXAM: PORTABLE CHEST 1 VIEW COMPARISON:  09/27/2006 FINDINGS: Lungs are clear.  No pleural effusion or pneumothorax. The heart is normal in size. IMPRESSION: Normal chest radiographs. Electronically Signed   By: Julian Hy M.D.   On: 09/03/2017 14:59     Sherene Sires, DO 09/04/2017, 4:56 AM PGY-2, [place] Intern pager: [phone], text pages welcome

## 2017-09-04 NOTE — Progress Notes (Signed)
Patient arrived from ED with wife by his side. Oriented patient to room, [facility] policies and ensured needs met at this time

## 2017-09-04 NOTE — Progress Notes (Signed)
Temp 101.6 given tylenol and now temp 102.6. Dr Criss Rosales aware and wants to stick with tylenol

## 2017-09-05 DIAGNOSIS — N289 Disorder of kidney and ureter, unspecified: Secondary | ICD-10-CM | POA: Diagnosis not present

## 2017-09-05 DIAGNOSIS — K5792 Diverticulitis of intestine, part unspecified, without perforation or abscess without bleeding: Secondary | ICD-10-CM | POA: Diagnosis not present

## 2017-09-05 LAB — CBC
HEMATOCRIT: 35.5 % — AB (ref 39.0–52.0)
Hemoglobin: 11.9 g/dL — ABNORMAL LOW (ref 13.0–17.0)
MCH: 26.6 pg (ref 26.0–34.0)
MCHC: 33.5 g/dL (ref 30.0–36.0)
MCV: 79.4 fL (ref 78.0–100.0)
Platelets: 183 10*3/uL (ref 150–400)
RBC: 4.47 MIL/uL (ref 4.22–5.81)
RDW: 13.3 % (ref 11.5–15.5)
WBC: 5 10*3/uL (ref 4.0–10.5)

## 2017-09-05 LAB — BASIC METABOLIC PANEL
ANION GAP: 11 (ref 5–15)
BUN: 17 mg/dL (ref 8–23)
CALCIUM: 8.8 mg/dL — AB (ref 8.9–10.3)
CO2: 25 mmol/L (ref 22–32)
Chloride: 104 mmol/L (ref 98–111)
Creatinine, Ser: 1.45 mg/dL — ABNORMAL HIGH (ref 0.61–1.24)
GFR calc Af Amer: 56 mL/min — ABNORMAL LOW (ref >60)
GFR calc non Af Amer: 48 mL/min — ABNORMAL LOW (ref >60)
GLUCOSE: 162 mg/dL — AB (ref 70–99)
POTASSIUM: 3.5 mmol/L (ref 3.5–5.1)
Sodium: 140 mmol/L (ref 135–145)

## 2017-09-05 LAB — HEMOGLOBIN A1C
HEMOGLOBIN A1C: 6.4 % — AB (ref 4.8–5.6)
MEAN PLASMA GLUCOSE: 137 mg/dL

## 2017-09-05 LAB — GLUCOSE, CAPILLARY
Glucose-Capillary: 139 mg/dL — ABNORMAL HIGH (ref 70–99)
Glucose-Capillary: 237 mg/dL — ABNORMAL HIGH (ref 70–99)

## 2017-09-05 MED ORDER — CIPROFLOXACIN HCL 500 MG PO TABS: 500.0000 mg | ORAL_TABLET | Freq: Two times a day (BID) | ORAL | 0 refills | Status: AC

## 2017-09-05 MED ORDER — METRONIDAZOLE 500 MG PO TABS: 500.0000 mg | ORAL_TABLET | Freq: Three times a day (TID) | ORAL | 0 refills | Status: AC

## 2017-09-05 NOTE — Progress Notes (Signed)
Family Medicine Teaching Service Daily Progress Note Intern Pager: [phone]  Patient name: Alexander Foster Medical record number: [id number] Date of birth: 1950-06-06 Age: 67 y.o. Gender: male  Primary Care Provider: Lucianne Lei, MD Consultants: none Code Status: Full  Pt Overview and Major Events to Date:  8/10 admit for possible diverticulitis  Assessment and Plan: Alexander Foster is a 67 y.o. male presenting with polyuria and lightheadedness. PMH is significant for T2DM w/ long term insulin use, elevated PSA w/ brachytherapy seeds, diverticulitis, severe environmental allergy  Sepsis likley 2/2 diverticulitis WBC stable and WNL. Vitals WNL. BP soft this am at 103/61 -cipro (8/10- 8/16) plan 7 days -metronidazole (8/10- 8/16) plan 7 days -home probiotic. -f/u blood cx - NGTD at 1 day -will need outpatient colonoscopy (patient wants to use Dr. Collene Mares) to r/o malignancy -I/O  T2DM half home dose tresiba. -home victoza -lantus 20 units -sSSI  HTN BP mildly low this am, 103/61.Tribenzor at home -continue irbesartan, amlodipine, hydrochlorothiazide -Hydralazine PRN  History of elevated PSA -home flomax  Seasonal allergies -Home singulair -dulera in place of symbicort  Pain control -tramadol PRN  FEN/GI: General diet PPx: Lovenox  Disposition: DC home today  Subjective:  Seen at bedside this morning accompanied by his wife.  He feels well and has no new complaints today he denies headache, chest pain, stomach pain.  His bowel movements have been normal, no diarrhea, no constipation, no blood.  I discussed the plan with patient to continue antibiotics and follow-up outpatient for colonoscopy for further evaluation the findings in the sigmoid colon.  Patient understood and agreed with the plan.  Objective: Temp:  [98.4 F (36.9 C)-100.6 F (38.1 C)] 98.7 F (37.1 C) (08/12 0400) Pulse Rate:  [74-100] 74 (08/12 0400) Resp:  [18-24] 18 (08/12 0400) BP:  (101-138)/(61-82) 103/61 (08/12 0400) SpO2:  [97 %-100 %] 100 % (08/12 0400) Physical Exam: General: Alert and cooperative and appears to be in no acute distress HEENT: Neck non-tender without lymphadenopathy, masses or thyromegaly Cardio: Normal A1 and S2, no S3 or S4. RRR. No murmurs or rubs.   Pulm: Clear to auscultation bilaterally, no crackles, wheezing, or diminished breath sounds. Normal respiratory effort Abdomen: Bowel sounds normal. Abdomen soft and non-tender.  Extremities: No peripheral edema. Warm/ well perfused.  Strong radial pulses.  Neuro: Cranial nerves grossly intact    Laboratory: Recent Labs  Lab 09/03/17 1437 09/04/17 0530 09/05/17 0459  WBC 6.6 5.7 5.0  HGB 13.0 12.3* 11.9*  HCT 38.7* 36.8* 35.5*  PLT 215 185 183   Recent Labs  Lab 09/03/17 1437 09/04/17 0530 09/05/17 0459  NA 137 138 140  K 3.6 3.5 3.5  CL 103 103 104  CO2 22 21* 25  BUN 18 15 17   CREATININE 1.58* 1.49* 1.45*  CALCIUM 9.1 8.7* 8.8*  PROT 7.6  --   --   BILITOT 0.8  --   --   ALKPHOS 48  --   --   ALT 28  --   --   AST 27  --   --   GLUCOSE 158* 139* 162*      Imaging/Diagnostic Tests: No results found.   Matilde Haymaker, MD 09/05/2017, 6:52 AM PGY-1, [place] Intern pager: [phone], text pages welcome

## 2017-09-05 NOTE — Discharge Instructions (Signed)
You were admitted to the hospital and treated for diverticulitis which is an infection and inflammation of small outpouchings of your large bowel.  While you are in the hospital you were started on 2 antibiotics: Ciprofloxacin and metronidazole.  Please finish out these antibiotics as they are prescribed and follow-up with your outpatient provider.  It will be important for you to have your colonoscopy done in order to further investigate the abnormal CT findings in your sigmoid colon.

## 2017-09-08 LAB — CULTURE, BLOOD (ROUTINE X 2)
CULTURE: NO GROWTH
Culture: NO GROWTH
Special Requests: ADEQUATE
Special Requests: ADEQUATE

## 2018-09-16 ENCOUNTER — Other Ambulatory Visit: Payer: Self-pay

## 2018-09-16 DIAGNOSIS — Z20822 Contact with and (suspected) exposure to covid-19: Secondary | ICD-10-CM

## 2018-09-17 LAB — NOVEL CORONAVIRUS, NAA: SARS-CoV-2, NAA: NOT DETECTED

## 2018-10-18 ENCOUNTER — Other Ambulatory Visit: Payer: Self-pay

## 2018-10-18 DIAGNOSIS — Z20822 Contact with and (suspected) exposure to covid-19: Secondary | ICD-10-CM

## 2018-10-20 LAB — NOVEL CORONAVIRUS, NAA: SARS-CoV-2, NAA: NOT DETECTED

## 2019-02-18 ENCOUNTER — Ambulatory Visit: Payer: BC Managed Care – PPO | Attending: Internal Medicine

## 2019-02-18 DIAGNOSIS — Z23 Encounter for immunization: Secondary | ICD-10-CM | POA: Insufficient documentation

## 2019-02-19 NOTE — Progress Notes (Signed)
   Covid-19 Vaccination Clinic  Name:  DAQWAN CHIONG    MRN: [id number] DOB: 1950/05/21  02/18/2019  Mr. Arenz was observed post Covid-19 immunization for 15 minutes without incidence. He was provided with Vaccine Information Sheet and instruction to access the V-Safe system.   Mr. Saturno was instructed to call 911 with any severe reactions post vaccine: Marland Kitchen Difficulty breathing  . Swelling of your face and throat  . A fast heartbeat  . A bad rash all over your body  . Dizziness and weakness    Immunizations Administered    Name Date Dose VIS Date Route   Moderna COVID-19 Vaccine 02/18/2019  3:39 PM 0.5 mL 12/26/2018 Intramuscular   Manufacturer: Levan Hurst   Lot: [id number]   [id number]      Documented on behalf of: K. Quentin Cornwall

## 2019-03-18 ENCOUNTER — Ambulatory Visit: Payer: BC Managed Care – PPO | Attending: Internal Medicine

## 2019-03-18 DIAGNOSIS — Z23 Encounter for immunization: Secondary | ICD-10-CM | POA: Insufficient documentation

## 2019-03-18 NOTE — Progress Notes (Signed)
   Covid-19 Vaccination Clinic  Name:  [name]    MRN: [id number] DOB: 04/17/1950  03/18/2019  Alexander Foster was observed post Covid-19 immunization for 15 minutes without incidence. He was provided with Vaccine Information Sheet and instruction to access the V-Safe system.   [name] was instructed to call 911 with any severe reactions post vaccine: Marland Kitchen Difficulty breathing  . Swelling of your face and throat  . A fast heartbeat  . A bad rash all over your body  . Dizziness and weakness    Immunizations Administered    Name Date Dose VIS Date Route   Moderna COVID-19 Vaccine 03/18/2019  4:24 PM 0.5 mL 12/26/2018 Intramuscular   Manufacturer: Moderna   Lot: [id number]   [id number]

## 2019-05-11 ENCOUNTER — Inpatient Hospital Stay (HOSPITAL_COMMUNITY)
Admission: EM | Admit: 2019-05-11 | Discharge: 2019-05-16 | DRG: 392 | Disposition: A | Discharge status: Home / Self Care | Payer: BC Managed Care – PPO | Source: Ambulatory Visit | Attending: Internal Medicine | Admitting: Internal Medicine

## 2019-05-11 ENCOUNTER — Encounter (HOSPITAL_COMMUNITY): Payer: Self-pay | Admitting: Emergency Medicine

## 2019-05-11 ENCOUNTER — Emergency Department (HOSPITAL_COMMUNITY): Payer: BC Managed Care – PPO

## 2019-05-11 ENCOUNTER — Other Ambulatory Visit: Payer: Self-pay

## 2019-05-11 DIAGNOSIS — I129 Hypertensive chronic kidney disease with stage 1 through stage 4 chronic kidney disease, or unspecified chronic kidney disease: Secondary | ICD-10-CM | POA: Diagnosis present

## 2019-05-11 DIAGNOSIS — K5740 Diverticulitis of both small and large intestine with perforation and abscess without bleeding: Secondary | ICD-10-CM | POA: Diagnosis not present

## 2019-05-11 DIAGNOSIS — Z8249 Family history of ischemic heart disease and other diseases of the circulatory system: Secondary | ICD-10-CM | POA: Diagnosis not present

## 2019-05-11 DIAGNOSIS — D539 Nutritional anemia, unspecified: Secondary | ICD-10-CM | POA: Diagnosis present

## 2019-05-11 DIAGNOSIS — Z8546 Personal history of malignant neoplasm of prostate: Secondary | ICD-10-CM

## 2019-05-11 DIAGNOSIS — T502X5A Adverse effect of carbonic-anhydrase inhibitors, benzothiadiazides and other diuretics, initial encounter: Secondary | ICD-10-CM | POA: Diagnosis not present

## 2019-05-11 DIAGNOSIS — K5780 Diverticulitis of intestine, part unspecified, with perforation and abscess without bleeding: Secondary | ICD-10-CM | POA: Diagnosis present

## 2019-05-11 DIAGNOSIS — E1122 Type 2 diabetes mellitus with diabetic chronic kidney disease: Secondary | ICD-10-CM | POA: Diagnosis present

## 2019-05-11 DIAGNOSIS — Z20822 Contact with and (suspected) exposure to covid-19: Secondary | ICD-10-CM | POA: Diagnosis present

## 2019-05-11 DIAGNOSIS — N400 Enlarged prostate without lower urinary tract symptoms: Secondary | ICD-10-CM | POA: Diagnosis present

## 2019-05-11 DIAGNOSIS — Z8719 Personal history of other diseases of the digestive system: Secondary | ICD-10-CM

## 2019-05-11 DIAGNOSIS — D509 Iron deficiency anemia, unspecified: Secondary | ICD-10-CM | POA: Diagnosis present

## 2019-05-11 DIAGNOSIS — Z923 Personal history of irradiation: Secondary | ICD-10-CM | POA: Diagnosis not present

## 2019-05-11 DIAGNOSIS — Z7982 Long term (current) use of aspirin: Secondary | ICD-10-CM | POA: Diagnosis not present

## 2019-05-11 DIAGNOSIS — Z7984 Long term (current) use of oral hypoglycemic drugs: Secondary | ICD-10-CM

## 2019-05-11 DIAGNOSIS — K5792 Diverticulitis of intestine, part unspecified, without perforation or abscess without bleeding: Secondary | ICD-10-CM | POA: Diagnosis present

## 2019-05-11 DIAGNOSIS — N182 Chronic kidney disease, stage 2 (mild): Secondary | ICD-10-CM | POA: Diagnosis present

## 2019-05-11 DIAGNOSIS — K56609 Unspecified intestinal obstruction, unspecified as to partial versus complete obstruction: Secondary | ICD-10-CM

## 2019-05-11 DIAGNOSIS — K5720 Diverticulitis of large intestine with perforation and abscess without bleeding: Secondary | ICD-10-CM | POA: Insufficient documentation

## 2019-05-11 DIAGNOSIS — E876 Hypokalemia: Secondary | ICD-10-CM | POA: Diagnosis not present

## 2019-05-11 DIAGNOSIS — K5669 Other partial intestinal obstruction: Secondary | ICD-10-CM | POA: Diagnosis present

## 2019-05-11 DIAGNOSIS — Z79899 Other long term (current) drug therapy: Secondary | ICD-10-CM | POA: Diagnosis not present

## 2019-05-11 LAB — CBC WITH DIFFERENTIAL/PLATELET
Abs Immature Granulocytes: 0.08 10*3/uL — ABNORMAL HIGH (ref 0.00–0.07)
Basophils Absolute: 0 10*3/uL (ref 0.0–0.1)
Basophils Relative: 0 %
Eosinophils Absolute: 0.1 10*3/uL (ref 0.0–0.5)
Eosinophils Relative: 1 %
HCT: 33.5 % — ABNORMAL LOW (ref 39.0–52.0)
Hemoglobin: 11.2 g/dL — ABNORMAL LOW (ref 13.0–17.0)
Immature Granulocytes: 1 %
Lymphocytes Relative: 9 %
Lymphs Abs: 1.3 10*3/uL (ref 0.7–4.0)
MCH: 27.1 pg (ref 26.0–34.0)
MCHC: 33.4 g/dL (ref 30.0–36.0)
MCV: 81.1 fL (ref 80.0–100.0)
Monocytes Absolute: 1.1 10*3/uL — ABNORMAL HIGH (ref 0.1–1.0)
Monocytes Relative: 8 %
Neutro Abs: 12.1 10*3/uL — ABNORMAL HIGH (ref 1.7–7.7)
Neutrophils Relative %: 81 %
Platelets: 282 10*3/uL (ref 150–400)
RBC: 4.13 MIL/uL — ABNORMAL LOW (ref 4.22–5.81)
RDW: 13.3 % (ref 11.5–15.5)
WBC: 14.7 10*3/uL — ABNORMAL HIGH (ref 4.0–10.5)
nRBC: 0 % (ref 0.0–0.2)

## 2019-05-11 LAB — URINALYSIS, ROUTINE W REFLEX MICROSCOPIC
Bacteria, UA: NONE SEEN
Bilirubin Urine: NEGATIVE
Glucose, UA: NEGATIVE mg/dL
Hgb urine dipstick: NEGATIVE
Ketones, ur: 20 mg/dL — AB
Leukocytes,Ua: NEGATIVE
Nitrite: NEGATIVE
Protein, ur: 30 mg/dL — AB
Specific Gravity, Urine: 1.026 (ref 1.005–1.030)
pH: 5 (ref 5.0–8.0)

## 2019-05-11 LAB — COMPREHENSIVE METABOLIC PANEL
ALT: 52 U/L — ABNORMAL HIGH (ref 0–44)
AST: 37 U/L (ref 15–41)
Albumin: 3.3 g/dL — ABNORMAL LOW (ref 3.5–5.0)
Alkaline Phosphatase: 51 U/L (ref 38–126)
Anion gap: 10 (ref 5–15)
BUN: 16 mg/dL (ref 8–23)
CO2: 24 mmol/L (ref 22–32)
Calcium: 8.8 mg/dL — ABNORMAL LOW (ref 8.9–10.3)
Chloride: 105 mmol/L (ref 98–111)
Creatinine, Ser: 1.6 mg/dL — ABNORMAL HIGH (ref 0.61–1.24)
GFR calc Af Amer: 50 mL/min — ABNORMAL LOW (ref >60)
GFR calc non Af Amer: 43 mL/min — ABNORMAL LOW (ref >60)
Glucose, Bld: 158 mg/dL — ABNORMAL HIGH (ref 70–99)
Potassium: 3.5 mmol/L (ref 3.5–5.1)
Sodium: 139 mmol/L (ref 135–145)
Total Bilirubin: 1.2 mg/dL (ref 0.3–1.2)
Total Protein: 7.9 g/dL (ref 6.5–8.1)

## 2019-05-11 LAB — LIPASE, BLOOD: Lipase: 22 U/L (ref 11–51)

## 2019-05-11 MED ORDER — METRONIDAZOLE IN NACL 5-0.79 MG/ML-% IV SOLN
500.0000 mg | Freq: Once | INTRAVENOUS | Status: AC
  Administered 2019-05-11: 22:00:00 500 mg via INTRAVENOUS
  Filled 2019-05-11: qty 100

## 2019-05-11 MED ORDER — CIPROFLOXACIN IN D5W 400 MG/200ML IV SOLN
400.0000 mg | Freq: Two times a day (BID) | INTRAVENOUS | Status: DC
  Administered 2019-05-12 – 2019-05-16 (×9): 400 mg via INTRAVENOUS
  Filled 2019-05-11 (×9): qty 200

## 2019-05-11 MED ORDER — ENOXAPARIN SODIUM 40 MG/0.4ML ~~LOC~~ SOLN
40.0000 mg | Freq: Every day | SUBCUTANEOUS | Status: DC
  Administered 2019-05-12 – 2019-05-16 (×5): 40 mg via SUBCUTANEOUS
  Filled 2019-05-11 (×5): qty 0.4

## 2019-05-11 MED ORDER — MONTELUKAST SODIUM 10 MG PO TABS
10.0000 mg | ORAL_TABLET | Freq: Every day | ORAL | Status: DC
  Administered 2019-05-12 – 2019-05-16 (×5): 10 mg via ORAL
  Filled 2019-05-11 (×5): qty 1

## 2019-05-11 MED ORDER — IRBESARTAN 150 MG PO TABS
150.0000 mg | ORAL_TABLET | Freq: Every day | ORAL | Status: DC
  Administered 2019-05-12 – 2019-05-16 (×5): 150 mg via ORAL
  Filled 2019-05-11 (×5): qty 1

## 2019-05-11 MED ORDER — ONDANSETRON HCL 4 MG PO TABS: 4.0000 mg | ORAL_TABLET | Freq: Four times a day (QID) | ORAL | Status: DC | PRN

## 2019-05-11 MED ORDER — HYDROCHLOROTHIAZIDE 12.5 MG PO CAPS
12.5000 mg | ORAL_CAPSULE | Freq: Every day | ORAL | Status: DC
  Administered 2019-05-12 – 2019-05-14 (×3): 12.5 mg via ORAL
  Filled 2019-05-11 (×3): qty 1

## 2019-05-11 MED ORDER — SODIUM CHLORIDE 0.9% FLUSH
3.0000 mL | Freq: Two times a day (BID) | INTRAVENOUS | Status: DC
  Administered 2019-05-12 – 2019-05-16 (×4): 3 mL via INTRAVENOUS

## 2019-05-11 MED ORDER — OLMESARTAN-AMLODIPINE-HCTZ 20-5-12.5 MG PO TABS: 1.0000 | ORAL_TABLET | Freq: Every day | ORAL | Status: DC

## 2019-05-11 MED ORDER — HYDROCODONE-ACETAMINOPHEN 5-325 MG PO TABS
1.0000 | ORAL_TABLET | ORAL | Status: DC | PRN
  Administered 2019-05-12: 2 via ORAL
  Administered 2019-05-13 – 2019-05-14 (×2): 1 via ORAL
  Filled 2019-05-11 (×2): qty 2
  Filled 2019-05-11: qty 1

## 2019-05-11 MED ORDER — MORPHINE SULFATE (PF) 2 MG/ML IV SOLN: 2.0000 mg | INTRAVENOUS | Status: DC | PRN

## 2019-05-11 MED ORDER — HYDRALAZINE HCL 10 MG PO TABS
10.0000 mg | ORAL_TABLET | Freq: Four times a day (QID) | ORAL | Status: DC | PRN
  Filled 2019-05-11: qty 1

## 2019-05-11 MED ORDER — ONDANSETRON HCL 4 MG/2ML IJ SOLN: 4.0000 mg | Freq: Four times a day (QID) | INTRAMUSCULAR | Status: DC | PRN

## 2019-05-11 MED ORDER — CIPROFLOXACIN IN D5W 400 MG/200ML IV SOLN
400.0000 mg | Freq: Once | INTRAVENOUS | Status: AC
  Administered 2019-05-11: 22:00:00 400 mg via INTRAVENOUS
  Filled 2019-05-11: qty 200

## 2019-05-11 MED ORDER — TAMSULOSIN HCL 0.4 MG PO CAPS
0.4000 mg | ORAL_CAPSULE | Freq: Every day | ORAL | Status: DC
  Administered 2019-05-12 – 2019-05-15 (×4): 0.4 mg via ORAL
  Filled 2019-05-11 (×4): qty 1

## 2019-05-11 MED ORDER — IOHEXOL 300 MG/ML  SOLN
100.0000 mL | Freq: Once | INTRAMUSCULAR | Status: AC | PRN
  Administered 2019-05-11: 21:00:00 100 mL via INTRAVENOUS

## 2019-05-11 MED ORDER — SODIUM CHLORIDE 0.9 % IV SOLN: INTRAVENOUS | Status: DC

## 2019-05-11 NOTE — ED Provider Notes (Signed)
[facility] DEPT Provider Note   CSN: [id number] Arrival date & time: 05/11/19  2010     History Chief Complaint  Patient presents with  . Abdominal Pain    BARAK FRIAR is a 69 y.o. male.  Patient with a onset of abdominal pain 2 days ago.  Sent in from primary care doctor's office.  Had a fever.  Up to 102.  Temp upon arrival here 100.3 heart rate 115 blood pressure 146/91.  Oxygen saturations 100%.  Patient's had a history of diverticulitis in the past probably about 2 years ago.  Patient says clinically it feels like that.  No nausea or vomiting.  No blood in the bowel movements.  Past medical history is significant for diabetes hypertension history of prostate cancer.        Past Medical History:  Diagnosis Date  . Anemia   . Cancer ([place])    seed implant for prostate cancer  . Diabetes mellitus without complication ([place])   . Hypertension     Patient Active Problem List   Diagnosis Date Noted  . Sepsis ([place])   . Renal insufficiency   . Diverticulitis 09/03/2017  . Deficiency anemia 05/08/2013    Past Surgical History:  Procedure Laterality Date  . RADIOACTIVE SEED IMPLANT  2010       History reviewed. No pertinent family history.  Social History   Tobacco Use  . Smoking status: Never Smoker  . Smokeless tobacco: Never Used  Substance Use Topics  . Alcohol use: Yes    Comment: Ocassionally  . Drug use: Never    Home Medications Prior to Admission medications   Medication Sig Start Date End Date Taking? Authorizing Provider  ANDROGEL PUMP 20.25 MG/ACT (1.62%) GEL Apply 2 application topically daily. 07/01/16  Yes [provider]  aspirin 81 MG chewable tablet Chew 81 mg by mouth daily.   Yes [provider]  Iron-FA-B Cmp-C-Biot-Probiotic (FUSION PLUS) CAPS Take 1 capsule by mouth daily. 01/28/14  Yes [provider]  metFORMIN (GLUCOPHAGE) 1000 MG tablet Take 1,000 mg by mouth 2 (two) times  daily with a meal.    Yes [provider]  montelukast (SINGULAIR) 10 MG tablet Take 10 mg by mouth daily. 08/24/17  Yes [provider]  Multiple Vitamin (MULTIVITAMIN WITH MINERALS) TABS tablet Take 1 tablet by mouth daily.   Yes [provider]  niacin (NIASPAN) 500 MG CR tablet Take 500 mg by mouth at bedtime.   Yes [provider]  tamsulosin (FLOMAX) 0.4 MG CAPS capsule Take 0.4 mg by mouth daily after supper.   Yes [provider]  TRIBENZOR 20-5-12.5 MG TABS Take 1 tablet by mouth daily. 02/13/14  Yes [provider]  VICTOZA 18 MG/3ML SOPN Inject 18 Units into the skin daily.  08/20/14  Yes [provider]    Allergies    Patient has no known allergies.  Review of Systems   Review of Systems  Constitutional: Positive for fever. Negative for chills.  HENT: Negative for congestion, rhinorrhea and sore throat.   Eyes: Negative for visual disturbance.  Respiratory: Negative for cough and shortness of breath.   Cardiovascular: Negative for chest pain and leg swelling.  Gastrointestinal: Positive for abdominal pain. Negative for diarrhea, nausea and vomiting.  Genitourinary: Negative for dysuria.  Musculoskeletal: Negative for back pain and neck pain.  Skin: Negative for rash.  Neurological: Negative for dizziness, light-headedness and headaches.  Hematological: Does not bruise/bleed easily.  Psychiatric/Behavioral:  Negative for confusion.    Physical Exam Updated Vital Signs BP (!) 146/91   Pulse (!) 115   Temp 100.3 F (37.9 C) (Oral)   Resp 19   Ht 1.803 m (5\' 11" )   Wt 99.8 kg   SpO2 100%   BMI 30.68 kg/m   Physical Exam Vitals and nursing note reviewed.  Constitutional:      Appearance: Normal appearance. He is well-developed.  HENT:     Head: Normocephalic and atraumatic.  Eyes:     Extraocular Movements: Extraocular movements intact.     Conjunctiva/sclera: Conjunctivae normal.     Pupils: Pupils  are equal, round, and reactive to light.  Cardiovascular:     Rate and Rhythm: Normal rate and regular rhythm.     Heart sounds: No murmur.  Pulmonary:     Effort: Pulmonary effort is normal. No respiratory distress.     Breath sounds: Normal breath sounds.  Abdominal:     Palpations: Abdomen is soft.     Tenderness: There is no abdominal tenderness.     Comments: Abdomen nontender.  Musculoskeletal:        General: Normal range of motion.     Cervical back: Normal range of motion and neck supple.  Skin:    General: Skin is warm and dry.     Capillary Refill: Capillary refill takes less than 2 seconds.  Neurological:     General: No focal deficit present.     Mental Status: He is alert and oriented to person, place, and time.     Cranial Nerves: No cranial nerve deficit.     Sensory: No sensory deficit.     Motor: No weakness.     ED Results / Procedures / Treatments   Labs (all labs ordered are listed, but only abnormal results are displayed) Labs Reviewed  COMPREHENSIVE METABOLIC PANEL - Abnormal; Notable for the following components:      Result Value   Glucose, Bld 158 (*)    Creatinine, Ser 1.60 (*)    Calcium 8.8 (*)    Albumin 3.3 (*)    ALT 52 (*)    GFR calc non Af Amer 43 (*)    GFR calc Af Amer 50 (*)    All other components within normal limits  CBC WITH DIFFERENTIAL/PLATELET - Abnormal; Notable for the following components:   WBC 14.7 (*)    RBC 4.13 (*)    Hemoglobin 11.2 (*)    HCT 33.5 (*)    Neutro Abs 12.1 (*)    Monocytes Absolute 1.1 (*)    Abs Immature Granulocytes 0.08 (*)    All other components within normal limits  LIPASE, BLOOD  URINALYSIS, ROUTINE W REFLEX MICROSCOPIC    EKG None  Radiology CT Abdomen Pelvis W Contrast  Result Date: 05/11/2019 CLINICAL DATA:  Diverticulitis suspected, abdominal pain in the left lower quadrant for 1 day EXAM: CT ABDOMEN AND PELVIS WITH CONTRAST TECHNIQUE: Multidetector CT imaging of the abdomen and  pelvis was performed using the standard protocol following bolus administration of intravenous contrast. CONTRAST:  143mL OMNIPAQUE IOHEXOL 300 MG/ML  SOLN COMPARISON:  CT September 03, 2017 FINDINGS: Lower chest: Lung bases are clear. Normal heart size. No pericardial effusion. Hepatobiliary: No focal liver abnormality is seen. No gallstones, gallbladder wall thickening, or biliary dilatation. Pancreas: Partial fatty replacement of the pancreas. No pancreatic ductal dilatation or surrounding inflammatory changes. Spleen: Normal in size without focal abnormality. Adrenals/Urinary Tract: Circumscribed 1.3 cm fat containing  nodule in the right adrenal gland compatible with myelolipoma. No other focal concerning adrenal lesion. Slight thickening and periadrenal fat stranding is similar to comparison exams, a non specific finding though unchanged since 2019. Could feasibly reflect sequela of prior infection or inflammation. Mild bilateral symmetric perinephric stranding, a nonspecific finding though may correlate with either age or decreased renal function. Kidneys are otherwise unremarkable, without renal calculi, suspicious lesion, or hydronephrosis. Focal thickening along the bladder dome with fat stranding extending from the inflamed sigmoid colon, favored to be reactive. A discernible fat plane is maintained between the bladder in the adjacent inflammation. Stomach/Bowel: Distal esophagus, stomach and duodenal sweep are unremarkable. No small bowel wall thickening or dilatation. No evidence of obstruction. A normal appendix is visualized. The colon is largely stool-filled to the level of some marked focal thickening the proximal to mid sigmoid colon. There is extensive phlegmonous changes seen upon a inflamed outpouching, portion of which appears localized within the adjacent fat and is suggestive of a contained diverticular perforation (coronal 5/66). Additional intramural rim enhancing component may reflect a small  intramural abscess measuring up to 2.5 cm in size (coronal 6/136). The more distal sigmoid and rectum is unremarkable. Vascular/Lymphatic: Atherosclerotic plaque within the normal caliber aorta. Likely reactive adenopathy noted in the low abdomen and pelvis. No pathologically enlarged abdominopelvic nodes. Reproductive: Multiple prostate brachytherapy implants are noted. No concerning prostate abnormality. Other: Extensive phlegmonous changes and trace free fluid in the left pericolic gutter. No extraluminal gas. Inflammation extends to the bladder dome with likely reactive thickening, as detailed above. Musculoskeletal: Multilevel degenerative changes are present in the imaged portions of the spine. No acute osseous abnormality or suspicious osseous lesion. IMPRESSION: 1. Marked focal thickening the proximal to mid sigmoid colon with extensive phlegmonous changes seen upon a inflamed outpouching, portion of which appears localized within the adjacent fat and is suggestive of a contained diverticular perforation. Additional intramural rim enhancing component may reflect a small intramural abscess measuring up to 2.5 cm in size. Of note, this is in a region of prior diverticular disease seen on comparison CT. Furthermore, the marked thickening seen circumferentially should warrant colonoscopy to exclude underlying malignancy if not performed previously. 2. Inflammation extends to the bladder dome with likely reactive thickening. A discernible fat plane is maintained between the bladder and the adjacent inflammation which argues against potential fistulization. Recommend correlation with urinalysis to exclude cystitis. 3. 1.3 cm fat containing nodule in the right adrenal gland compatible with myelolipoma. Mild faint adrenal fat stranding is nonspecific and similar to comparison. 4. Aortic Atherosclerosis (ICD10-I70.0). Electronically Signed   By: Lovena Le M.D.   On: 05/11/2019 21:45    Procedures Procedures  (including critical care time)  Medications Ordered in ED Medications  0.9 %  sodium chloride infusion ( Intravenous New Bag/Given 05/11/19 2037)  ciprofloxacin (CIPRO) IVPB 400 mg (400 mg Intravenous New Bag/Given 05/11/19 2209)  metroNIDAZOLE (FLAGYL) IVPB 500 mg (500 mg Intravenous New Bag/Given 05/11/19 2209)  iohexol (OMNIPAQUE) 300 MG/ML solution 100 mL (100 mLs Intravenous Contrast Given 05/11/19 2121)    ED Course  I have reviewed the triage vital signs and the nursing notes.  Pertinent labs & imaging results that were available during my care of the patient were reviewed by me and considered in my medical decision making (see chart for details).    MDM Rules/Calculators/A&P                      Labs  significant for leukocytosis.  Some renal insufficiency.  Patient CT scan of the abdomen and shows evidence of diverticulitis distal: Area.  There is probably area of perforation without abscess measuring 2.5 x 2.5 cm.  Based on this patient will require admission.  Patient clinically stable.  Discussed with on-call general surgery Dr. Marcello Moores.  They will consult.  Patient started on IV Cipro and Flagyl.  Covid testing ordered.  We will discussed with the hospitalist for admission.   Final Clinical Impression(s) / ED Diagnoses Final diagnoses:  Diverticulitis of large intestine with perforation and abscess without bleeding    Rx / DC Orders ED Discharge Orders    None       Fredia Sorrow, MD 05/11/19 2224

## 2019-05-11 NOTE — H&P (Signed)
History and Physical    Alexander Foster [id number] DOB: December 01, 1950 DOA: 05/11/2019  PCP: Lucianne Lei, MD  Patient coming from: Home  Chief Complaint: Abdominal pain  HPI: Alexander Foster is a 69 y.o. male with medical history significant of HTN, DM2, h/o prostate cancer, anemia, allergies who presents for abdominal pain.  He reports that the pain is in the LLQ and started yesterday.  He was working and was on a [employer] when it started.  It progressed to an 8/10 and stayed there until getting fluids and medication in the ED.  He notes that he think it may have been triggered by eating some bad ham a few days ago.  Since that time he has been constipated and he feels this may have been related.  He has had no chills, no vomiting, no nausea.  He has had a fever up to 102F and was mildly tachycardic as well.  He has no chest pain, SOB, swelling, dizziness, lightheadedness, dysuria, or changes to his urinary habits.  He has had an episode of diverticulitis about 2 years ago.  He reports no history of colon cancer in him or family. He has had regular colonoscopies with Dr. Collene Mares, last about 3 years ago which he reports as normal.   ED Course: In the ED, he was found to have Glucose of 158, Cr of 1.6 (baseline 1.4 - 1.6), ALT of 52, lipase 22, WBC of 14 with a neutrophil predominance.  CT scan showed a mid sigmoid swelling and contained pouch with likely abscess of about 2.5cm in size.    Review of Systems: As per HPI otherwise all other systems reviewed and are negative.  Past Medical History:  Diagnosis Date  . Anemia   . Cancer ([place])    seed implant for prostate cancer  . Diabetes mellitus without complication ([place])   . Hypertension     Past Surgical History:  Procedure Laterality Date  . RADIOACTIVE SEED IMPLANT  2010    Social History  reports that he has never smoked. He has never used smokeless tobacco. He reports current alcohol use. He reports that he does not use  drugs.  No Known Allergies  Family History  Problem Relation Age of Onset  . Heart attack Mother   . Healthy Father        reports living to 83, without any major health issues     Prior to Admission medications   Medication Sig Start Date End Date Taking? Authorizing Provider  ANDROGEL PUMP 20.25 MG/ACT (1.62%) GEL Apply 2 application topically daily. 07/01/16  Yes [provider]  aspirin 81 MG chewable tablet Chew 81 mg by mouth daily.   Yes [provider]  Iron-FA-B Cmp-C-Biot-Probiotic (FUSION PLUS) CAPS Take 1 capsule by mouth daily. 01/28/14  Yes [provider]  metFORMIN (GLUCOPHAGE) 1000 MG tablet Take 1,000 mg by mouth 2 (two) times daily with a meal.    Yes [provider]  montelukast (SINGULAIR) 10 MG tablet Take 10 mg by mouth daily. 08/24/17  Yes [provider]  Multiple Vitamin (MULTIVITAMIN WITH MINERALS) TABS tablet Take 1 tablet by mouth daily.   Yes [provider]  niacin (NIASPAN) 500 MG CR tablet Take 500 mg by mouth at bedtime.   Yes [provider]  tamsulosin (FLOMAX) 0.4 MG CAPS capsule Take 0.4 mg by mouth daily after supper.   Yes [provider]  TRIBENZOR 20-5-12.5 MG TABS Take 1 tablet by mouth daily.  02/13/14  Yes [provider]  VICTOZA 18 MG/3ML SOPN Inject 18 Units into the skin daily.  08/20/14  Yes [provider]    Physical Exam: Vitals:   05/11/19 2016 05/11/19 2017 05/11/19 2022 05/11/19 2215  BP:  (!) 146/91  120/83  Pulse:  (!) 115  (!) 112  Resp:  19  20  Temp:  100.3 F (37.9 C)    TempSrc:  Oral    SpO2: 98% 100%  100%  Weight:   99.8 kg   Height:   5\' 11"  (1.803 m)     Constitutional: NAD, calm, comfortable, lying in bed Eyes:  lids and conjunctivae normal, no scleral icterus ENMT: NCAT, neck is supple Respiratory: CTAB, no wheezing, rales, or rhonchi Cardiovascular: Tachycardic, regular, no murmur Abdomen: + TTP in the LLQ, some mild  localized swelling and distention there and voluntary guarding, +BS.  Otherwise soft and non tender Musculoskeletal: No contractures, normal muscle tone for age Skin: Multiple SK's or skin tags on back, otherwise no lesions on exposed skin Neurologic: Grossly intact, moving easily in bed Psychiatric: Normal judgment and insight. Alert and oriented x 3. Normal mood.    Labs on Admission: I have personally reviewed following labs and imaging studies  CBC: Recent Labs  Lab 05/11/19 2036  WBC 14.7*  NEUTROABS 12.1*  HGB 11.2*  HCT 33.5*  MCV 81.1  PLT [id number]    Basic Metabolic Panel: Recent Labs  Lab 05/11/19 2036  NA 139  K 3.5  CL 105  CO2 24  GLUCOSE 158*  BUN 16  CREATININE 1.60*  CALCIUM 8.8*    GFR: Estimated Creatinine Clearance: 52.4 mL/min (A) (by C-G formula based on SCr of 1.6 mg/dL (H)).  Liver Function Tests: Recent Labs  Lab 05/11/19 2036  AST 37  ALT 52*  ALKPHOS 51  BILITOT 1.2  PROT 7.9  ALBUMIN 3.3*    Urine analysis:    Component Value Date/Time   COLORURINE YELLOW 05/11/2019 2152   APPEARANCEUR HAZY (A) 05/11/2019 2152   LABSPEC 1.026 05/11/2019 2152   [place] 5.0 05/11/2019 2152   GLUCOSEU NEGATIVE 05/11/2019 2152   HGBUR NEGATIVE 05/11/2019 2152   [place] NEGATIVE 05/11/2019 2152   KETONESUR 20 (A) 05/11/2019 2152   PROTEINUR 30 (A) 05/11/2019 2152   UROBILINOGEN 0.2 01/13/2009 1959   NITRITE NEGATIVE 05/11/2019 2152   LEUKOCYTESUR NEGATIVE 05/11/2019 2152    Radiological Exams on Admission: CT Abdomen Pelvis W Contrast  Result Date: 05/11/2019 CLINICAL DATA:  Diverticulitis suspected, abdominal pain in the left lower quadrant for 1 day EXAM: CT ABDOMEN AND PELVIS WITH CONTRAST TECHNIQUE: Multidetector CT imaging of the abdomen and pelvis was performed using the standard protocol following bolus administration of intravenous contrast. CONTRAST:  137mL OMNIPAQUE IOHEXOL 300 MG/ML  SOLN COMPARISON:  CT September 03, 2017 FINDINGS:  Lower chest: Lung bases are clear. Normal heart size. No pericardial effusion. Hepatobiliary: No focal liver abnormality is seen. No gallstones, gallbladder wall thickening, or biliary dilatation. Pancreas: Partial fatty replacement of the pancreas. No pancreatic ductal dilatation or surrounding inflammatory changes. Spleen: Normal in size without focal abnormality. Adrenals/Urinary Tract: Circumscribed 1.3 cm fat containing nodule in the right adrenal gland compatible with myelolipoma. No other focal concerning adrenal lesion. Slight thickening and periadrenal fat stranding is similar to comparison exams, a non specific finding though unchanged since 2019. Could feasibly reflect sequela of prior infection or inflammation. Mild bilateral symmetric perinephric stranding, a nonspecific finding though may correlate with either age  or decreased renal function. Kidneys are otherwise unremarkable, without renal calculi, suspicious lesion, or hydronephrosis. Focal thickening along the bladder dome with fat stranding extending from the inflamed sigmoid colon, favored to be reactive. A discernible fat plane is maintained between the bladder in the adjacent inflammation. Stomach/Bowel: Distal esophagus, stomach and duodenal sweep are unremarkable. No small bowel wall thickening or dilatation. No evidence of obstruction. A normal appendix is visualized. The colon is largely stool-filled to the level of some marked focal thickening the proximal to mid sigmoid colon. There is extensive phlegmonous changes seen upon a inflamed outpouching, portion of which appears localized within the adjacent fat and is suggestive of a contained diverticular perforation (coronal 5/66). Additional intramural rim enhancing component may reflect a small intramural abscess measuring up to 2.5 cm in size (coronal 6/136). The more distal sigmoid and rectum is unremarkable. Vascular/Lymphatic: Atherosclerotic plaque within the normal caliber aorta.  Likely reactive adenopathy noted in the low abdomen and pelvis. No pathologically enlarged abdominopelvic nodes. Reproductive: Multiple prostate brachytherapy implants are noted. No concerning prostate abnormality. Other: Extensive phlegmonous changes and trace free fluid in the left pericolic gutter. No extraluminal gas. Inflammation extends to the bladder dome with likely reactive thickening, as detailed above. Musculoskeletal: Multilevel degenerative changes are present in the imaged portions of the spine. No acute osseous abnormality or suspicious osseous lesion. IMPRESSION: 1. Marked focal thickening the proximal to mid sigmoid colon with extensive phlegmonous changes seen upon a inflamed outpouching, portion of which appears localized within the adjacent fat and is suggestive of a contained diverticular perforation. Additional intramural rim enhancing component may reflect a small intramural abscess measuring up to 2.5 cm in size. Of note, this is in a region of prior diverticular disease seen on comparison CT. Furthermore, the marked thickening seen circumferentially should warrant colonoscopy to exclude underlying malignancy if not performed previously. 2. Inflammation extends to the bladder dome with likely reactive thickening. A discernible fat plane is maintained between the bladder and the adjacent inflammation which argues against potential fistulization. Recommend correlation with urinalysis to exclude cystitis. 3. 1.3 cm fat containing nodule in the right adrenal gland compatible with myelolipoma. Mild faint adrenal fat stranding is nonspecific and similar to comparison. 4. Aortic Atherosclerosis (ICD10-I70.0). Electronically Signed   By: Lovena Le M.D.   On: 05/11/2019 21:45     Assessment/Plan Diverticulitis of intestine with abscess - CT scan revealed marked thickening of the sigmoid with a contained abscess of 2.5cm - WBC elevated, febrile.  Does not meet criteria for sepsis, however -  He is requesting to eat, no nausea and vomiting --> soft diet - Gen Surgery consulted - Started on IV cipro and flagyl for intra-abdominal disease - Has been screened regularly for colon cancer in the past - Advised if he has nausea or worsening of pain with eating, to revert to NPO - Follow Surgery recommendations - Pain control with Morphine and hydrocodone - Nausea control with Zofran  HTN - Continue home tribenzor, monitor for elevated BPs  DM2, well controlled (Last A1C 6.4 in our system) - Check A1C - Hold metformin and victoza - SSI with CBGs  BPH  - Continue home tamsulosin - Ins and outs  CKD stage 2 - Stable, chronic - Monitor    H/O Iron Deficiency anemia - Continue iron - He will need a good bowel regimen going forward - Hgb 11.2, MCV 81     DVT prophylaxis: Lovenox  Code Status:   Full  Family Communication:  Wife Mildred at bedside  Disposition Plan:   Patient is from:  Home  Anticipated DC to:  Home  Anticipated DC date:  05/14/19  Anticipated DC barriers: None  Consults called:  Gen Surg, Dr. Marcello Moores Admission status:  Inp, Med Surg   Severity of Illness: The appropriate patient status for this patient is INPATIENT. Inpatient status is judged to be reasonable and necessary in order to provide the required intensity of service to ensure the patient's safety. The patient's presenting symptoms, physical exam findings, and initial radiographic and laboratory data in the context of their chronic comorbidities is felt to place them at high risk for further clinical deterioration. Furthermore, it is not anticipated that the patient will be medically stable for discharge from the hospital within 2 midnights of admission. The following factors support the patient status of inpatient.   " The patient's presenting symptoms include Abdominal pain, acute infection. " The worrisome physical exam findings include mod-severe abdominal pain. " The initial radiographic and  laboratory data are worrisome because of diverticulitis with abscess. " The chronic co-morbidities include diabetes, hypertension.   * I certify that at the point of admission it is my clinical judgment that the patient will require inpatient hospital care spanning beyond 2 midnights from the point of admission due to high intensity of service, high risk for further deterioration and high frequency of surveillance required.Gilles Chiquito MD [facility]  How to contact the [facility] Attending or Consulting provider [place] or covering provider during after hours [place], for this patient?   1. Check the care team in [facility] and look for a) attending/consulting [facility] provider listed and b) the [facility] team listed 2. Log into [email or web address] and use [place]'s universal password to access. If you do not have the password, please contact the hospital operator. 3. Locate the [facility] provider you are looking for under [facility] and page to a number that you can be directly reached. 4. If you still have difficulty reaching the provider, please page the [facility] (Director on Call) for the Hospitalists listed on amion for assistance.  05/11/2019, 11:08 PM

## 2019-05-11 NOTE — ED Triage Notes (Signed)
Pt arrived via EMS from Dr's office. Pt has been experiencing abdominal pain in the left lower quadrant for a day. Pt has also noticed some distension in his abdomen for about a day. Pt has had difficulty having bowel movements for the past day. Pt reports a 102.1 F temperature. Pt has hx of type 2 diabetes and hypertension

## 2019-05-11 NOTE — Progress Notes (Signed)
Pharmacy Antibiotic Note  Alexander Foster is a 69 y.o. male admitted on 05/11/2019 with intra-abdominal infection.  Pharmacy has been consulted for cipro dosing.  Plan: Cipro 400 mg IV q12h Flagyl 500 mg IV q8h F/u scr/cultures  Height: 5\' 11"  (180.3 cm) Weight: 99.8 kg (220 lb) IBW/kg (Calculated) : 75.3  Temp (24hrs), Avg:100.3 F (37.9 C), Min:100.3 F (37.9 C), Max:100.3 F (37.9 C)  Recent Labs  Lab 05/11/19 2036  WBC 14.7*  CREATININE 1.60*    Estimated Creatinine Clearance: 52.4 mL/min (A) (by C-G formula based on SCr of 1.6 mg/dL (H)).    No Known Allergies  Antimicrobials this admission: 4/16 cipro >>  4/16 flagyl >>   Dose adjustments this admission:   Microbiology results:  BCx:   UCx:    Sputum:    MRSA PCR:   Thank you for allowing pharmacy to be a part of this patient's care.  Dorrene German 05/11/2019 11:41 PM

## 2019-05-12 DIAGNOSIS — D539 Nutritional anemia, unspecified: Secondary | ICD-10-CM

## 2019-05-12 DIAGNOSIS — K5780 Diverticulitis of intestine, part unspecified, with perforation and abscess without bleeding: Secondary | ICD-10-CM

## 2019-05-12 LAB — GLUCOSE, CAPILLARY
Glucose-Capillary: 125 mg/dL — ABNORMAL HIGH (ref 70–99)
Glucose-Capillary: 167 mg/dL — ABNORMAL HIGH (ref 70–99)
Glucose-Capillary: 169 mg/dL — ABNORMAL HIGH (ref 70–99)
Glucose-Capillary: 181 mg/dL — ABNORMAL HIGH (ref 70–99)
Glucose-Capillary: 209 mg/dL — ABNORMAL HIGH (ref 70–99)

## 2019-05-12 LAB — CBC
HCT: 31.9 % — ABNORMAL LOW (ref 39.0–52.0)
Hemoglobin: 10.6 g/dL — ABNORMAL LOW (ref 13.0–17.0)
MCH: 27 pg (ref 26.0–34.0)
MCHC: 33.2 g/dL (ref 30.0–36.0)
MCV: 81.2 fL (ref 80.0–100.0)
Platelets: 263 10*3/uL (ref 150–400)
RBC: 3.93 MIL/uL — ABNORMAL LOW (ref 4.22–5.81)
RDW: 13.2 % (ref 11.5–15.5)
WBC: 13.8 10*3/uL — ABNORMAL HIGH (ref 4.0–10.5)
nRBC: 0 % (ref 0.0–0.2)

## 2019-05-12 LAB — SARS CORONAVIRUS 2 (TAT 6-24 HRS): SARS Coronavirus 2: NEGATIVE

## 2019-05-12 LAB — HEMOGLOBIN A1C
Hgb A1c MFr Bld: 7 % — ABNORMAL HIGH (ref 4.8–5.6)
Mean Plasma Glucose: 154.2 mg/dL

## 2019-05-12 LAB — BASIC METABOLIC PANEL
Anion gap: 6 (ref 5–15)
BUN: 16 mg/dL (ref 8–23)
CO2: 26 mmol/L (ref 22–32)
Calcium: 8.5 mg/dL — ABNORMAL LOW (ref 8.9–10.3)
Chloride: 106 mmol/L (ref 98–111)
Creatinine, Ser: 1.63 mg/dL — ABNORMAL HIGH (ref 0.61–1.24)
GFR calc Af Amer: 49 mL/min — ABNORMAL LOW (ref >60)
GFR calc non Af Amer: 42 mL/min — ABNORMAL LOW (ref >60)
Glucose, Bld: 201 mg/dL — ABNORMAL HIGH (ref 70–99)
Potassium: 3.4 mmol/L — ABNORMAL LOW (ref 3.5–5.1)
Sodium: 138 mmol/L (ref 135–145)

## 2019-05-12 LAB — HIV ANTIBODY (ROUTINE TESTING W REFLEX): HIV Screen 4th Generation wRfx: NONREACTIVE

## 2019-05-12 MED ORDER — INSULIN ASPART 100 UNIT/ML ~~LOC~~ SOLN: 0.0000 [IU] | Freq: Every day | SUBCUTANEOUS | Status: DC

## 2019-05-12 MED ORDER — INSULIN ASPART 100 UNIT/ML ~~LOC~~ SOLN
0.0000 [IU] | Freq: Three times a day (TID) | SUBCUTANEOUS | Status: DC
  Administered 2019-05-12 (×2): 2 [IU] via SUBCUTANEOUS
  Administered 2019-05-12: 3 [IU] via SUBCUTANEOUS
  Administered 2019-05-13: 1 [IU] via SUBCUTANEOUS
  Administered 2019-05-13: 2 [IU] via SUBCUTANEOUS
  Administered 2019-05-13 – 2019-05-16 (×5): 1 [IU] via SUBCUTANEOUS

## 2019-05-12 MED ORDER — METRONIDAZOLE IN NACL 5-0.79 MG/ML-% IV SOLN
500.0000 mg | Freq: Three times a day (TID) | INTRAVENOUS | Status: DC
  Administered 2019-05-12 – 2019-05-16 (×13): 500 mg via INTRAVENOUS
  Filled 2019-05-12 (×13): qty 100

## 2019-05-12 NOTE — Consult Note (Signed)
CC: LLQ pain, fevers  Requesting provider: Dr Wyonia Hough  HPI: Alexander Foster is an 69 y.o. male who is here for acute diverticulitis.  This is his first documented episode.  He began to have LLQ pain, constipation and fevers yesterday, which prompted ED evaluation.  CT show diverticulitis with possible small intramural abscess.  Pt having BM's overnight.  Pain about the same this AM.  Last colonoscopy was 3 years ago.  Past Medical History:  Diagnosis Date  . Anemia   . Cancer ([place])    seed implant for prostate cancer  . Diabetes mellitus without complication ([place])   . Hypertension     Past Surgical History:  Procedure Laterality Date  . RADIOACTIVE SEED IMPLANT  2010    Family History  Problem Relation Age of Onset  . Heart attack Mother   . Healthy Father        reports living to 38, without any major health issues    Social:  reports that he has never smoked. He has never used smokeless tobacco. He reports current alcohol use. He reports that he does not use drugs.  Allergies: No Known Allergies  Medications: I have reviewed the patient's current medications.  Results for orders placed or performed during the hospital encounter of 05/11/19 (from the past 48 hour(s))  Comprehensive metabolic panel     Status: Abnormal   Collection Time: 05/11/19  8:36 PM  Result Value Ref Range   Sodium 139 135 - 145 mmol/L   Potassium 3.5 3.5 - 5.1 mmol/L   Chloride 105 98 - 111 mmol/L   CO2 24 22 - 32 mmol/L   Glucose, Bld 158 (H) 70 - 99 mg/dL    Comment: Glucose reference range applies only to samples taken after fasting for at least 8 hours.   BUN 16 8 - 23 mg/dL   Creatinine, Ser 1.60 (H) 0.61 - 1.24 mg/dL   Calcium 8.8 (L) 8.9 - 10.3 mg/dL   Total Protein 7.9 6.5 - 8.1 g/dL   Albumin 3.3 (L) 3.5 - 5.0 g/dL   AST 37 15 - 41 U/L   ALT 52 (H) 0 - 44 U/L   Alkaline Phosphatase 51 38 - 126 U/L   Total Bilirubin 1.2 0.3 - 1.2 mg/dL   GFR calc non Af Amer 43 (L) >60 mL/min   GFR calc Af Amer 50 (L) >60 mL/min   Anion gap 10 5 - 15    Comment: Performed at [facility], [place] [address] [place], [place] [address]  Lipase, blood     Status: None   Collection Time: 05/11/19  8:36 PM  Result Value Ref Range   Lipase 22 11 - 51 U/L    Comment: Performed at [facility], [place] [address], [place], [place] [address]  CBC with Differential/Platelet     Status: Abnormal   Collection Time: 05/11/19  8:36 PM  Result Value Ref Range   WBC 14.7 (H) 4.0 - 10.5 K/uL   RBC 4.13 (L) 4.22 - 5.81 MIL/uL   Hemoglobin 11.2 (L) 13.0 - 17.0 g/dL   HCT 33.5 (L) 39.0 - 52.0 %   MCV 81.1 80.0 - 100.0 fL   MCH 27.1 26.0 - 34.0 pg   MCHC 33.4 30.0 - 36.0 g/dL   RDW 13.3 11.5 - 15.5 %   Platelets 282 150 - 400 K/uL   nRBC 0.0 0.0 - 0.2 %   Neutrophils Relative % 81 %   Neutro  Abs 12.1 (H) 1.7 - 7.7 K/uL   Lymphocytes Relative 9 %   Lymphs Abs 1.3 0.7 - 4.0 K/uL   Monocytes Relative 8 %   Monocytes Absolute 1.1 (H) 0.1 - 1.0 K/uL   Eosinophils Relative 1 %   Eosinophils Absolute 0.1 0.0 - 0.5 K/uL   Basophils Relative 0 %   Basophils Absolute 0.0 0.0 - 0.1 K/uL   Immature Granulocytes 1 %   Abs Immature Granulocytes 0.08 (H) 0.00 - 0.07 K/uL    Comment: Performed at [facility], [place] [address], [place], [place] [address]  Urinalysis, Routine w reflex microscopic     Status: Abnormal   Collection Time: 05/11/19  9:52 PM  Result Value Ref Range   Color, Urine YELLOW YELLOW   APPearance HAZY (A) CLEAR   Specific Gravity, Urine 1.026 1.005 - 1.030   pH 5.0 5.0 - 8.0   Glucose, UA NEGATIVE NEGATIVE mg/dL   Hgb urine dipstick NEGATIVE NEGATIVE   Bilirubin Urine NEGATIVE NEGATIVE   Ketones, ur 20 (A) NEGATIVE mg/dL   Protein, ur 30 (A) NEGATIVE mg/dL   Nitrite NEGATIVE NEGATIVE   Leukocytes,Ua NEGATIVE NEGATIVE   RBC / HPF 0-5 0 - 5 RBC/hpf   WBC, UA 0-5 0 - 5 WBC/hpf   Bacteria, UA NONE SEEN NONE SEEN   Squamous  Epithelial / LPF 0-5 0 - 5    Comment: Performed at [facility], [place] [address], [place], [place] [address]  SARS CORONAVIRUS 2 (TAT 6-24 HRS) Nasopharyngeal Nasopharyngeal Swab     Status: None   Collection Time: 05/11/19 10:20 PM   Specimen: Nasopharyngeal Swab  Result Value Ref Range   SARS Coronavirus 2 NEGATIVE NEGATIVE    Comment: (NOTE) SARS-CoV-2 target nucleic acids are NOT DETECTED. The SARS-CoV-2 RNA is generally detectable in upper and lower respiratory specimens during the acute phase of infection. Negative results do not preclude SARS-CoV-2 infection, do not rule out co-infections with other pathogens, and should not be used as the sole basis for treatment or other patient management decisions. Negative results must be combined with clinical observations, patient history, and epidemiological information. The expected result is Negative. Fact Sheet for Patients: SugarRoll.be Fact Sheet for Healthcare Providers: https://www.woods-mathews.com/ This test is not yet approved or cleared by the [place] FDA and  has been authorized for detection and/or diagnosis of SARS-CoV-2 by FDA under an Emergency Use Authorization (EUA). This EUA will remain  in effect (meaning this test can be used) for the duration of the COVID-19 declaration under Section 56 4(b)(1) of the Act, 21 U.S.C. section 360bbb-3(b)(1), unless the authorization is terminated or revoked sooner. Performed at [facility], [place] [address], [place],  [address]   Glucose, capillary     Status: Abnormal   Collection Time: 05/12/19 12:13 AM  Result Value Ref Range   Glucose-Capillary 169 (H) 70 - 99 mg/dL    Comment: Glucose reference range applies only to samples taken after fasting for at least 8 hours.  Basic metabolic panel     Status: Abnormal   Collection Time: 05/12/19  4:57 AM  Result Value Ref Range   Sodium 138 135 - 145  mmol/L   Potassium 3.4 (L) 3.5 - 5.1 mmol/L   Chloride 106 98 - 111 mmol/L   CO2 26 22 - 32 mmol/L   Glucose, Bld 201 (H) 70 - 99 mg/dL    Comment: Glucose reference range applies only to samples taken after  fasting for at least 8 hours.   BUN 16 8 - 23 mg/dL   Creatinine, Ser 1.63 (H) 0.61 - 1.24 mg/dL   Calcium 8.5 (L) 8.9 - 10.3 mg/dL   GFR calc non Af Amer 42 (L) >60 mL/min   GFR calc Af Amer 49 (L) >60 mL/min   Anion gap 6 5 - 15    Comment: Performed at [facility], [place] [address], [place], [place] [address]  CBC     Status: Abnormal   Collection Time: 05/12/19  4:57 AM  Result Value Ref Range   WBC 13.8 (H) 4.0 - 10.5 K/uL   RBC 3.93 (L) 4.22 - 5.81 MIL/uL   Hemoglobin 10.6 (L) 13.0 - 17.0 g/dL   HCT 31.9 (L) 39.0 - 52.0 %   MCV 81.2 80.0 - 100.0 fL   MCH 27.0 26.0 - 34.0 pg   MCHC 33.2 30.0 - 36.0 g/dL   RDW 13.2 11.5 - 15.5 %   Platelets 263 150 - 400 K/uL   nRBC 0.0 0.0 - 0.2 %    Comment: Performed at [facility], [place] [address], [place], [place] [address]  Hemoglobin A1c     Status: Abnormal   Collection Time: 05/12/19  4:57 AM  Result Value Ref Range   Hgb A1c MFr Bld 7.0 (H) 4.8 - 5.6 %    Comment: (NOTE) Pre diabetes:          5.7%-6.4% Diabetes:              >6.4% Glycemic control for   <7.0% adults with diabetes    Mean Plasma Glucose 154.2 mg/dL    Comment: Performed at [place] [address], [place],  [address]  Glucose, capillary     Status: Abnormal   Collection Time: 05/12/19  7:21 AM  Result Value Ref Range   Glucose-Capillary 167 (H) 70 - 99 mg/dL    Comment: Glucose reference range applies only to samples taken after fasting for at least 8 hours.    CT Abdomen Pelvis W Contrast  Result Date: 05/11/2019 CLINICAL DATA:  Diverticulitis suspected, abdominal pain in the left lower quadrant for 1 day EXAM: CT ABDOMEN AND PELVIS WITH CONTRAST TECHNIQUE: Multidetector CT imaging of the abdomen  and pelvis was performed using the standard protocol following bolus administration of intravenous contrast. CONTRAST:  159mL OMNIPAQUE IOHEXOL 300 MG/ML  SOLN COMPARISON:  CT September 03, 2017 FINDINGS: Lower chest: Lung bases are clear. Normal heart size. No pericardial effusion. Hepatobiliary: No focal liver abnormality is seen. No gallstones, gallbladder wall thickening, or biliary dilatation. Pancreas: Partial fatty replacement of the pancreas. No pancreatic ductal dilatation or surrounding inflammatory changes. Spleen: Normal in size without focal abnormality. Adrenals/Urinary Tract: Circumscribed 1.3 cm fat containing nodule in the right adrenal gland compatible with myelolipoma. No other focal concerning adrenal lesion. Slight thickening and periadrenal fat stranding is similar to comparison exams, a non specific finding though unchanged since 2019. Could feasibly reflect sequela of prior infection or inflammation. Mild bilateral symmetric perinephric stranding, a nonspecific finding though may correlate with either age or decreased renal function. Kidneys are otherwise unremarkable, without renal calculi, suspicious lesion, or hydronephrosis. Focal thickening along the bladder dome with fat stranding extending from the inflamed sigmoid colon, favored to be reactive. A discernible fat plane is maintained between the bladder in the adjacent inflammation. Stomach/Bowel: Distal esophagus, stomach and duodenal sweep are unremarkable. No small bowel wall thickening or dilatation. No  evidence of obstruction. A normal appendix is visualized. The colon is largely stool-filled to the level of some marked focal thickening the proximal to mid sigmoid colon. There is extensive phlegmonous changes seen upon a inflamed outpouching, portion of which appears localized within the adjacent fat and is suggestive of a contained diverticular perforation (coronal 5/66). Additional intramural rim enhancing component may reflect a  small intramural abscess measuring up to 2.5 cm in size (coronal 6/136). The more distal sigmoid and rectum is unremarkable. Vascular/Lymphatic: Atherosclerotic plaque within the normal caliber aorta. Likely reactive adenopathy noted in the low abdomen and pelvis. No pathologically enlarged abdominopelvic nodes. Reproductive: Multiple prostate brachytherapy implants are noted. No concerning prostate abnormality. Other: Extensive phlegmonous changes and trace free fluid in the left pericolic gutter. No extraluminal gas. Inflammation extends to the bladder dome with likely reactive thickening, as detailed above. Musculoskeletal: Multilevel degenerative changes are present in the imaged portions of the spine. No acute osseous abnormality or suspicious osseous lesion. IMPRESSION: 1. Marked focal thickening the proximal to mid sigmoid colon with extensive phlegmonous changes seen upon a inflamed outpouching, portion of which appears localized within the adjacent fat and is suggestive of a contained diverticular perforation. Additional intramural rim enhancing component may reflect a small intramural abscess measuring up to 2.5 cm in size. Of note, this is in a region of prior diverticular disease seen on comparison CT. Furthermore, the marked thickening seen circumferentially should warrant colonoscopy to exclude underlying malignancy if not performed previously. 2. Inflammation extends to the bladder dome with likely reactive thickening. A discernible fat plane is maintained between the bladder and the adjacent inflammation which argues against potential fistulization. Recommend correlation with urinalysis to exclude cystitis. 3. 1.3 cm fat containing nodule in the right adrenal gland compatible with myelolipoma. Mild faint adrenal fat stranding is nonspecific and similar to comparison. 4. Aortic Atherosclerosis (ICD10-I70.0). Electronically Signed   By: Lovena Le M.D.   On: 05/11/2019 21:45    ROS - all of the  below systems have been reviewed with the patient and positives are indicated with bold text General: chills, fever or night sweats Eyes: blurry vision or double vision ENT: epistaxis or sore throat Allergy/Immunology: itchy/watery eyes or nasal congestion Hematologic/Lymphatic: bleeding problems, blood clots or swollen lymph nodes Endocrine: temperature intolerance or unexpected weight changes Breast: new or changing breast lumps or nipple discharge Resp: cough, shortness of breath, or wheezing CV: chest pain or dyspnea on exertion GI: as per HPI GU: dysuria, trouble voiding, or hematuria MSK: joint pain or joint stiffness Neuro: TIA or stroke symptoms Derm: pruritus and skin lesion changes Psych: anxiety and depression  PE Blood pressure 134/77, pulse (!) 101, temperature 99.2 F (37.3 C), temperature source Oral, resp. rate 18, height 5\' 11"  (1.803 m), weight 99.4 kg, SpO2 100 %. Constitutional: NAD; conversant; no deformities Eyes: Moist conjunctiva; no lid lag; anicteric; PERRL Neck: Trachea midline; no thyromegaly Lungs: Normal respiratory effort; no tactile fremitus CV: RRR; no palpable thrills; no pitting edema GI: Abd soft, TTP LLQ; no palpable hepatosplenomegaly MSK: Normal range of motion of extremities; no clubbing/cyanosis Psychiatric: Appropriate affect; alert and oriented x3 Lymphatic: No palpable cervical or axillary lymphadenopathy  Results for orders placed or performed during the hospital encounter of 05/11/19 (from the past 48 hour(s))  Comprehensive metabolic panel     Status: Abnormal   Collection Time: 05/11/19  8:36 PM  Result Value Ref Range   Sodium 139 135 - 145 mmol/L   Potassium 3.5 3.5 -  5.1 mmol/L   Chloride 105 98 - 111 mmol/L   CO2 24 22 - 32 mmol/L   Glucose, Bld 158 (H) 70 - 99 mg/dL    Comment: Glucose reference range applies only to samples taken after fasting for at least 8 hours.   BUN 16 8 - 23 mg/dL   Creatinine, Ser 1.60 (H) 0.61 -  1.24 mg/dL   Calcium 8.8 (L) 8.9 - 10.3 mg/dL   Total Protein 7.9 6.5 - 8.1 g/dL   Albumin 3.3 (L) 3.5 - 5.0 g/dL   AST 37 15 - 41 U/L   ALT 52 (H) 0 - 44 U/L   Alkaline Phosphatase 51 38 - 126 U/L   Total Bilirubin 1.2 0.3 - 1.2 mg/dL   GFR calc non Af Amer 43 (L) >60 mL/min   GFR calc Af Amer 50 (L) >60 mL/min   Anion gap 10 5 - 15    Comment: Performed at [facility], [place] [address] [place], [place] [address]  Lipase, blood     Status: None   Collection Time: 05/11/19  8:36 PM  Result Value Ref Range   Lipase 22 11 - 51 U/L    Comment: Performed at [facility], [place] [address] [place], [place] [address]  CBC with Differential/Platelet     Status: Abnormal   Collection Time: 05/11/19  8:36 PM  Result Value Ref Range   WBC 14.7 (H) 4.0 - 10.5 K/uL   RBC 4.13 (L) 4.22 - 5.81 MIL/uL   Hemoglobin 11.2 (L) 13.0 - 17.0 g/dL   HCT 33.5 (L) 39.0 - 52.0 %   MCV 81.1 80.0 - 100.0 fL   MCH 27.1 26.0 - 34.0 pg   MCHC 33.4 30.0 - 36.0 g/dL   RDW 13.3 11.5 - 15.5 %   Platelets 282 150 - 400 K/uL   nRBC 0.0 0.0 - 0.2 %   Neutrophils Relative % 81 %   Neutro Abs 12.1 (H) 1.7 - 7.7 K/uL   Lymphocytes Relative 9 %   Lymphs Abs 1.3 0.7 - 4.0 K/uL   Monocytes Relative 8 %   Monocytes Absolute 1.1 (H) 0.1 - 1.0 K/uL   Eosinophils Relative 1 %   Eosinophils Absolute 0.1 0.0 - 0.5 K/uL   Basophils Relative 0 %   Basophils Absolute 0.0 0.0 - 0.1 K/uL   Immature Granulocytes 1 %   Abs Immature Granulocytes 0.08 (H) 0.00 - 0.07 K/uL    Comment: Performed at [facility], [place] [address], [place], [place] [address]  Urinalysis, Routine w reflex microscopic     Status: Abnormal   Collection Time: 05/11/19  9:52 PM  Result Value Ref Range   Color, Urine YELLOW YELLOW   APPearance HAZY (A) CLEAR   Specific Gravity, Urine 1.026 1.005 - 1.030   pH 5.0 5.0 - 8.0   Glucose, UA NEGATIVE NEGATIVE mg/dL   Hgb urine dipstick NEGATIVE NEGATIVE     Bilirubin Urine NEGATIVE NEGATIVE   Ketones, ur 20 (A) NEGATIVE mg/dL   Protein, ur 30 (A) NEGATIVE mg/dL   Nitrite NEGATIVE NEGATIVE   Leukocytes,Ua NEGATIVE NEGATIVE   RBC / HPF 0-5 0 - 5 RBC/hpf   WBC, UA 0-5 0 - 5 WBC/hpf   Bacteria, UA NONE SEEN NONE SEEN   Squamous Epithelial / LPF 0-5 0 - 5    Comment: Performed at [facility], [place] [address], [place], [place] [address]  SARS CORONAVIRUS 2 (TAT  6-24 HRS) Nasopharyngeal Nasopharyngeal Swab     Status: None   Collection Time: 05/11/19 10:20 PM   Specimen: Nasopharyngeal Swab  Result Value Ref Range   SARS Coronavirus 2 NEGATIVE NEGATIVE    Comment: (NOTE) SARS-CoV-2 target nucleic acids are NOT DETECTED. The SARS-CoV-2 RNA is generally detectable in upper and lower respiratory specimens during the acute phase of infection. Negative results do not preclude SARS-CoV-2 infection, do not rule out co-infections with other pathogens, and should not be used as the sole basis for treatment or other patient management decisions. Negative results must be combined with clinical observations, patient history, and epidemiological information. The expected result is Negative. Fact Sheet for Patients: SugarRoll.be Fact Sheet for Healthcare Providers: https://www.woods-mathews.com/ This test is not yet approved or cleared by the [place] FDA and  has been authorized for detection and/or diagnosis of SARS-CoV-2 by FDA under an Emergency Use Authorization (EUA). This EUA will remain  in effect (meaning this test can be used) for the duration of the COVID-19 declaration under Section 56 4(b)(1) of the Act, 21 U.S.C. section 360bbb-3(b)(1), unless the authorization is terminated or revoked sooner. Performed at [facility], [place] [address] [place], [place] [address]   Glucose, capillary     Status: Abnormal   Collection Time: 05/12/19 12:13 AM  Result Value Ref Range    Glucose-Capillary 169 (H) 70 - 99 mg/dL    Comment: Glucose reference range applies only to samples taken after fasting for at least 8 hours.  Basic metabolic panel     Status: Abnormal   Collection Time: 05/12/19  4:57 AM  Result Value Ref Range   Sodium 138 135 - 145 mmol/L   Potassium 3.4 (L) 3.5 - 5.1 mmol/L   Chloride 106 98 - 111 mmol/L   CO2 26 22 - 32 mmol/L   Glucose, Bld 201 (H) 70 - 99 mg/dL    Comment: Glucose reference range applies only to samples taken after fasting for at least 8 hours.   BUN 16 8 - 23 mg/dL   Creatinine, Ser 1.63 (H) 0.61 - 1.24 mg/dL   Calcium 8.5 (L) 8.9 - 10.3 mg/dL   GFR calc non Af Amer 42 (L) >60 mL/min   GFR calc Af Amer 49 (L) >60 mL/min   Anion gap 6 5 - 15    Comment: Performed at [facility], [place] [address] [place], [place] [address]  CBC     Status: Abnormal   Collection Time: 05/12/19  4:57 AM  Result Value Ref Range   WBC 13.8 (H) 4.0 - 10.5 K/uL   RBC 3.93 (L) 4.22 - 5.81 MIL/uL   Hemoglobin 10.6 (L) 13.0 - 17.0 g/dL   HCT 31.9 (L) 39.0 - 52.0 %   MCV 81.2 80.0 - 100.0 fL   MCH 27.0 26.0 - 34.0 pg   MCHC 33.2 30.0 - 36.0 g/dL   RDW 13.2 11.5 - 15.5 %   Platelets 263 150 - 400 K/uL   nRBC 0.0 0.0 - 0.2 %    Comment: Performed at [facility], [place] [address] [place], [place] [address]  Hemoglobin A1c     Status: Abnormal   Collection Time: 05/12/19  4:57 AM  Result Value Ref Range   Hgb A1c MFr Bld 7.0 (H) 4.8 - 5.6 %    Comment: (NOTE) Pre diabetes:          5.7%-6.4% Diabetes:              >  6.4% Glycemic control for   <7.0% adults with diabetes    Mean Plasma Glucose 154.2 mg/dL    Comment: Performed at [place] [address] [place], [place] [address]  Glucose, capillary     Status: Abnormal   Collection Time: 05/12/19  7:21 AM  Result Value Ref Range   Glucose-Capillary 167 (H) 70 - 99 mg/dL    Comment: Glucose reference range applies only to samples taken after  fasting for at least 8 hours.    CT Abdomen Pelvis W Contrast  Result Date: 05/11/2019 CLINICAL DATA:  Diverticulitis suspected, abdominal pain in the left lower quadrant for 1 day EXAM: CT ABDOMEN AND PELVIS WITH CONTRAST TECHNIQUE: Multidetector CT imaging of the abdomen and pelvis was performed using the standard protocol following bolus administration of intravenous contrast. CONTRAST:  145mL OMNIPAQUE IOHEXOL 300 MG/ML  SOLN COMPARISON:  CT September 03, 2017 FINDINGS: Lower chest: Lung bases are clear. Normal heart size. No pericardial effusion. Hepatobiliary: No focal liver abnormality is seen. No gallstones, gallbladder wall thickening, or biliary dilatation. Pancreas: Partial fatty replacement of the pancreas. No pancreatic ductal dilatation or surrounding inflammatory changes. Spleen: Normal in size without focal abnormality. Adrenals/Urinary Tract: Circumscribed 1.3 cm fat containing nodule in the right adrenal gland compatible with myelolipoma. No other focal concerning adrenal lesion. Slight thickening and periadrenal fat stranding is similar to comparison exams, a non specific finding though unchanged since 2019. Could feasibly reflect sequela of prior infection or inflammation. Mild bilateral symmetric perinephric stranding, a nonspecific finding though may correlate with either age or decreased renal function. Kidneys are otherwise unremarkable, without renal calculi, suspicious lesion, or hydronephrosis. Focal thickening along the bladder dome with fat stranding extending from the inflamed sigmoid colon, favored to be reactive. A discernible fat plane is maintained between the bladder in the adjacent inflammation. Stomach/Bowel: Distal esophagus, stomach and duodenal sweep are unremarkable. No small bowel wall thickening or dilatation. No evidence of obstruction. A normal appendix is visualized. The colon is largely stool-filled to the level of some marked focal thickening the proximal to mid  sigmoid colon. There is extensive phlegmonous changes seen upon a inflamed outpouching, portion of which appears localized within the adjacent fat and is suggestive of a contained diverticular perforation (coronal 5/66). Additional intramural rim enhancing component may reflect a small intramural abscess measuring up to 2.5 cm in size (coronal 6/136). The more distal sigmoid and rectum is unremarkable. Vascular/Lymphatic: Atherosclerotic plaque within the normal caliber aorta. Likely reactive adenopathy noted in the low abdomen and pelvis. No pathologically enlarged abdominopelvic nodes. Reproductive: Multiple prostate brachytherapy implants are noted. No concerning prostate abnormality. Other: Extensive phlegmonous changes and trace free fluid in the left pericolic gutter. No extraluminal gas. Inflammation extends to the bladder dome with likely reactive thickening, as detailed above. Musculoskeletal: Multilevel degenerative changes are present in the imaged portions of the spine. No acute osseous abnormality or suspicious osseous lesion. IMPRESSION: 1. Marked focal thickening the proximal to mid sigmoid colon with extensive phlegmonous changes seen upon a inflamed outpouching, portion of which appears localized within the adjacent fat and is suggestive of a contained diverticular perforation. Additional intramural rim enhancing component may reflect a small intramural abscess measuring up to 2.5 cm in size. Of note, this is in a region of prior diverticular disease seen on comparison CT. Furthermore, the marked thickening seen circumferentially should warrant colonoscopy to exclude underlying malignancy if not performed previously. 2. Inflammation extends to the bladder dome with likely reactive  thickening. A discernible fat plane is maintained between the bladder and the adjacent inflammation which argues against potential fistulization. Recommend correlation with urinalysis to exclude cystitis. 3. 1.3 cm fat  containing nodule in the right adrenal gland compatible with myelolipoma. Mild faint adrenal fat stranding is nonspecific and similar to comparison. 4. Aortic Atherosclerosis (ICD10-I70.0). Electronically Signed   By: Lovena Le M.D.   On: 05/11/2019 21:45     A/P: JIARUI SEAT is an 69 y.o. male with acute diverticulitis.  Cont IV abx.  Will order full liquids until pain improves.   No need for IR or surgical intervention at this time.  Will follow  Rosario Adie, MD  Colorectal and [place] [facility]

## 2019-05-12 NOTE — Progress Notes (Signed)
PROGRESS NOTE    KAZI Foster  [id number] DOB: 1950-04-23 DOA: 05/11/2019 PCP: Lucianne Lei, MD   Brief Narrative:  Per admitting attending: [name] is a 69 y.o. male with medical history significant of HTN, DM2, h/o prostate cancer, anemia, allergies who presents for abdominal pain.  He reports that the pain is in the LLQ and started yesterday.  He was working and was on a [employer] when it started.  It progressed to an 8/10 and stayed there until getting fluids and medication in the ED.  He notes that he think it may have been triggered by eating some bad ham a few days ago.  Since that time he has been constipated and he feels this may have been related.  He has had no chills, no vomiting, no nausea.  He has had a fever up to 102F and was mildly tachycardic as well.  He has no chest pain, SOB, swelling, dizziness, lightheadedness, dysuria, or changes to his urinary habits.  He has had an episode of diverticulitis about 2 years ago.  He reports no history of colon cancer in him or family. He has had regular colonoscopies with Dr. Collene Mares, last about 3 years ago which he reports as normal.   ED Course: In the ED, he was found to have Glucose of 158, Cr of 1.6 (baseline 1.4 - 1.6), ALT of 52, lipase 22, WBC of 14 with a neutrophil predominance.  CT scan showed a mid sigmoid swelling and contained pouch with likely abscess of about 2.5cm in size.     Assessment & Plan:   Active Problems:   Deficiency anemia   Diverticulitis of intestine with abscess   Diverticulitis with abscess Seen by surgery, continue treatment with antibiotics no surgical intervention currently Cipro Flagyl started on admission Monitor white blood cells As needed analgesics and antiemetics  Hypertension  Home medications  Diabetes still well controlled Hold Metformin and Victoza Continue sliding scale insulin CBGs  BPH Continue Flomax Monitor I/O's  CKD stage II Monitor renal function  Iron  deficiency anemia Monitor hemoglobin   DVT prophylaxis: Lovenox SQ  Code Status: Full code    Code Status Orders  (From admission, onward)         Start     Ordered   05/11/19 2307  Full code  Continuous     05/11/19 2307        Code Status History    Date Active Date Inactive Code Status Order ID Comments User Context   09/03/2017 2033 09/05/2017 2153 Full Code [id number]  Bonnita Hollow, MD ED   07/13/2013 1000 07/14/2013 0334 Full Code [id number]  Arne Cleveland, MD [facility]   Advance Care Planning Activity     Family Communication: called wife, total questions Disposition Plan:   Barrier to discharge patient continues IV antibiotics for continued treatment of abscess. Consults called: None Admission status: Inpatient   Consultants:   General surgery  Procedures:  CT Abdomen Pelvis W Contrast  Result Date: 05/11/2019 CLINICAL DATA:  Diverticulitis suspected, abdominal pain in the left lower quadrant for 1 day EXAM: CT ABDOMEN AND PELVIS WITH CONTRAST TECHNIQUE: Multidetector CT imaging of the abdomen and pelvis was performed using the standard protocol following bolus administration of intravenous contrast. CONTRAST:  125mL OMNIPAQUE IOHEXOL 300 MG/ML  SOLN COMPARISON:  CT September 03, 2017 FINDINGS: Lower chest: Lung bases are clear. Normal heart size. No pericardial effusion. Hepatobiliary: No focal liver abnormality is seen. No gallstones, gallbladder wall  thickening, or biliary dilatation. Pancreas: Partial fatty replacement of the pancreas. No pancreatic ductal dilatation or surrounding inflammatory changes. Spleen: Normal in size without focal abnormality. Adrenals/Urinary Tract: Circumscribed 1.3 cm fat containing nodule in the right adrenal gland compatible with myelolipoma. No other focal concerning adrenal lesion. Slight thickening and periadrenal fat stranding is similar to comparison exams, a non specific finding though unchanged since 2019. Could feasibly reflect sequela  of prior infection or inflammation. Mild bilateral symmetric perinephric stranding, a nonspecific finding though may correlate with either age or decreased renal function. Kidneys are otherwise unremarkable, without renal calculi, suspicious lesion, or hydronephrosis. Focal thickening along the bladder dome with fat stranding extending from the inflamed sigmoid colon, favored to be reactive. A discernible fat plane is maintained between the bladder in the adjacent inflammation. Stomach/Bowel: Distal esophagus, stomach and duodenal sweep are unremarkable. No small bowel wall thickening or dilatation. No evidence of obstruction. A normal appendix is visualized. The colon is largely stool-filled to the level of some marked focal thickening the proximal to mid sigmoid colon. There is extensive phlegmonous changes seen upon a inflamed outpouching, portion of which appears localized within the adjacent fat and is suggestive of a contained diverticular perforation (coronal 5/66). Additional intramural rim enhancing component may reflect a small intramural abscess measuring up to 2.5 cm in size (coronal 6/136). The more distal sigmoid and rectum is unremarkable. Vascular/Lymphatic: Atherosclerotic plaque within the normal caliber aorta. Likely reactive adenopathy noted in the low abdomen and pelvis. No pathologically enlarged abdominopelvic nodes. Reproductive: Multiple prostate brachytherapy implants are noted. No concerning prostate abnormality. Other: Extensive phlegmonous changes and trace free fluid in the left pericolic gutter. No extraluminal gas. Inflammation extends to the bladder dome with likely reactive thickening, as detailed above. Musculoskeletal: Multilevel degenerative changes are present in the imaged portions of the spine. No acute osseous abnormality or suspicious osseous lesion. IMPRESSION: 1. Marked focal thickening the proximal to mid sigmoid colon with extensive phlegmonous changes seen upon a  inflamed outpouching, portion of which appears localized within the adjacent fat and is suggestive of a contained diverticular perforation. Additional intramural rim enhancing component may reflect a small intramural abscess measuring up to 2.5 cm in size. Of note, this is in a region of prior diverticular disease seen on comparison CT. Furthermore, the marked thickening seen circumferentially should warrant colonoscopy to exclude underlying malignancy if not performed previously. 2. Inflammation extends to the bladder dome with likely reactive thickening. A discernible fat plane is maintained between the bladder and the adjacent inflammation which argues against potential fistulization. Recommend correlation with urinalysis to exclude cystitis. 3. 1.3 cm fat containing nodule in the right adrenal gland compatible with myelolipoma. Mild faint adrenal fat stranding is nonspecific and similar to comparison. 4. Aortic Atherosclerosis (ICD10-I70.0). Electronically Signed   By: Lovena Le M.D.   On: 05/11/2019 21:45     Antimicrobials:   Cipro and Flagyl day #2   Subjective: Patient reports he still has some abdominal pain but no acute worsening  Objective: Vitals:   05/11/19 2351 05/12/19 0100 05/12/19 0632 05/12/19 0935  BP: (!) 147/115 (!) 163/80 134/77 115/69  Pulse: (!) 110 (!) 103 (!) 101 99  Resp: 16 18 18 16   Temp: 99.3 F (37.4 C) 99.5 F (37.5 C) 99.2 F (37.3 C) 98.6 F (37 C)  TempSrc: Oral Oral Oral Oral  SpO2: 100% 99% 100% 97%  Weight: 99.4 kg     Height: 5\' 11"  (1.803 m)  Intake/Output Summary (Last 24 hours) at 05/12/2019 1138 Last data filed at 05/12/2019 0933 Gross per 24 hour  Intake 868.94 ml  Output 350 ml  Net 518.94 ml   Filed Weights   05/11/19 2022 05/11/19 2351  Weight: 99.8 kg 99.4 kg    Examination:  General exam: Appears calm and comfortable  Respiratory system: Clear to auscultation. Respiratory effort normal. Cardiovascular system: S1 & S2  heard, RRR. No JVD, murmurs, rubs, gallops or clicks. No pedal edema. Gastrointestinal system: Mildly tender to palpation diffusely no rebound no guarding, not rigid Central nervous system: Alert and oriented. No focal neurological deficits. Extremities: Warm well perfused no edema. Skin: No rashes, lesions or ulcers Psychiatry: Judgement and insight appear normal. Mood & affect appropriate.     Data Reviewed: I have personally reviewed following labs and imaging studies  CBC: Recent Labs  Lab 05/11/19 2036 05/12/19 0457  WBC 14.7* 13.8*  NEUTROABS 12.1*  --   HGB 11.2* 10.6*  HCT 33.5* 31.9*  MCV 81.1 81.2  PLT 282 [id number]   Basic Metabolic Panel: Recent Labs  Lab 05/11/19 2036 05/12/19 0457  NA 139 138  K 3.5 3.4*  CL 105 106  CO2 24 26  GLUCOSE 158* 201*  BUN 16 16  CREATININE 1.60* 1.63*  CALCIUM 8.8* 8.5*   GFR: Estimated Creatinine Clearance: 51.4 mL/min (A) (by C-G formula based on SCr of 1.63 mg/dL (H)). Liver Function Tests: Recent Labs  Lab 05/11/19 2036  AST 37  ALT 52*  ALKPHOS 51  BILITOT 1.2  PROT 7.9  ALBUMIN 3.3*   Recent Labs  Lab 05/11/19 2036  LIPASE 22   No results for input(s): AMMONIA in the last 168 hours. Coagulation Profile: No results for input(s): INR, PROTIME in the last 168 hours. Cardiac Enzymes: No results for input(s): CKTOTAL, CKMB, CKMBINDEX, TROPONINI in the last 168 hours. BNP (last 3 results) No results for input(s): PROBNP in the last 8760 hours. HbA1C: Recent Labs    05/12/19 0457  HGBA1C 7.0*   CBG: Recent Labs  Lab 05/12/19 0013 05/12/19 0721 05/12/19 1116  GLUCAP 169* 167* 209*   Lipid Profile: No results for input(s): CHOL, HDL, LDLCALC, TRIG, CHOLHDL, LDLDIRECT in the last 72 hours. Thyroid Function Tests: No results for input(s): TSH, T4TOTAL, FREET4, T3FREE, THYROIDAB in the last 72 hours. Anemia Panel: No results for input(s): VITAMINB12, FOLATE, FERRITIN, TIBC, IRON, RETICCTPCT in the last 72  hours. Sepsis Labs: No results for input(s): PROCALCITON, LATICACIDVEN in the last 168 hours.  Recent Results (from the past 240 hour(s))  SARS CORONAVIRUS 2 (TAT 6-24 HRS) Nasopharyngeal Nasopharyngeal Swab     Status: None   Collection Time: 05/11/19 10:20 PM   Specimen: Nasopharyngeal Swab  Result Value Ref Range Status   SARS Coronavirus 2 NEGATIVE NEGATIVE Final    Comment: (NOTE) SARS-CoV-2 target nucleic acids are NOT DETECTED. The SARS-CoV-2 RNA is generally detectable in upper and lower respiratory specimens during the acute phase of infection. Negative results do not preclude SARS-CoV-2 infection, do not rule out co-infections with other pathogens, and should not be used as the sole basis for treatment or other patient management decisions. Negative results must be combined with clinical observations, patient history, and epidemiological information. The expected result is Negative. Fact Sheet for Patients: SugarRoll.be Fact Sheet for Healthcare Providers: https://www.woods-mathews.com/ This test is not yet approved or cleared by the [place] FDA and  has been authorized for detection and/or diagnosis of SARS-CoV-2 by FDA under an  Emergency Use Authorization (EUA). This EUA will remain  in effect (meaning this test can be used) for the duration of the COVID-19 declaration under Section 56 4(b)(1) of the Act, 21 U.S.C. section 360bbb-3(b)(1), unless the authorization is terminated or revoked sooner. Performed at [facility], [place] [address] [place], [place] [address]          Radiology Studies: CT Abdomen Pelvis W Contrast  Result Date: 05/11/2019 CLINICAL DATA:  Diverticulitis suspected, abdominal pain in the left lower quadrant for 1 day EXAM: CT ABDOMEN AND PELVIS WITH CONTRAST TECHNIQUE: Multidetector CT imaging of the abdomen and pelvis was performed using the standard protocol following bolus administration  of intravenous contrast. CONTRAST:  172mL OMNIPAQUE IOHEXOL 300 MG/ML  SOLN COMPARISON:  CT September 03, 2017 FINDINGS: Lower chest: Lung bases are clear. Normal heart size. No pericardial effusion. Hepatobiliary: No focal liver abnormality is seen. No gallstones, gallbladder wall thickening, or biliary dilatation. Pancreas: Partial fatty replacement of the pancreas. No pancreatic ductal dilatation or surrounding inflammatory changes. Spleen: Normal in size without focal abnormality. Adrenals/Urinary Tract: Circumscribed 1.3 cm fat containing nodule in the right adrenal gland compatible with myelolipoma. No other focal concerning adrenal lesion. Slight thickening and periadrenal fat stranding is similar to comparison exams, a non specific finding though unchanged since 2019. Could feasibly reflect sequela of prior infection or inflammation. Mild bilateral symmetric perinephric stranding, a nonspecific finding though may correlate with either age or decreased renal function. Kidneys are otherwise unremarkable, without renal calculi, suspicious lesion, or hydronephrosis. Focal thickening along the bladder dome with fat stranding extending from the inflamed sigmoid colon, favored to be reactive. A discernible fat plane is maintained between the bladder in the adjacent inflammation. Stomach/Bowel: Distal esophagus, stomach and duodenal sweep are unremarkable. No small bowel wall thickening or dilatation. No evidence of obstruction. A normal appendix is visualized. The colon is largely stool-filled to the level of some marked focal thickening the proximal to mid sigmoid colon. There is extensive phlegmonous changes seen upon a inflamed outpouching, portion of which appears localized within the adjacent fat and is suggestive of a contained diverticular perforation (coronal 5/66). Additional intramural rim enhancing component may reflect a small intramural abscess measuring up to 2.5 cm in size (coronal 6/136). The more  distal sigmoid and rectum is unremarkable. Vascular/Lymphatic: Atherosclerotic plaque within the normal caliber aorta. Likely reactive adenopathy noted in the low abdomen and pelvis. No pathologically enlarged abdominopelvic nodes. Reproductive: Multiple prostate brachytherapy implants are noted. No concerning prostate abnormality. Other: Extensive phlegmonous changes and trace free fluid in the left pericolic gutter. No extraluminal gas. Inflammation extends to the bladder dome with likely reactive thickening, as detailed above. Musculoskeletal: Multilevel degenerative changes are present in the imaged portions of the spine. No acute osseous abnormality or suspicious osseous lesion. IMPRESSION: 1. Marked focal thickening the proximal to mid sigmoid colon with extensive phlegmonous changes seen upon a inflamed outpouching, portion of which appears localized within the adjacent fat and is suggestive of a contained diverticular perforation. Additional intramural rim enhancing component may reflect a small intramural abscess measuring up to 2.5 cm in size. Of note, this is in a region of prior diverticular disease seen on comparison CT. Furthermore, the marked thickening seen circumferentially should warrant colonoscopy to exclude underlying malignancy if not performed previously. 2. Inflammation extends to the bladder dome with likely reactive thickening. A discernible fat plane is maintained between the bladder and the adjacent inflammation which argues against potential fistulization. Recommend correlation  with urinalysis to exclude cystitis. 3. 1.3 cm fat containing nodule in the right adrenal gland compatible with myelolipoma. Mild faint adrenal fat stranding is nonspecific and similar to comparison. 4. Aortic Atherosclerosis (ICD10-I70.0). Electronically Signed   By: Lovena Le M.D.   On: 05/11/2019 21:45        Scheduled Meds: . enoxaparin (LOVENOX) injection  40 mg Subcutaneous Daily  . irbesartan   150 mg Oral Daily   And  . hydrochlorothiazide  12.5 mg Oral Daily  . insulin aspart  0-5 Units Subcutaneous QHS  . insulin aspart  0-9 Units Subcutaneous TID WC  . montelukast  10 mg Oral Daily  . sodium chloride flush  3 mL Intravenous Q12H  . tamsulosin  0.4 mg Oral QPC supper   Continuous Infusions: . sodium chloride 75 mL/hr at 05/11/19 2037  . ciprofloxacin 400 mg (05/12/19 0930)  . metronidazole 500 mg (05/12/19 0527)     LOS: 1 day    Time spent: 35 minutes    Nicolette Bang, MD [facility]  If 7PM-7AM, please contact night-coverage  05/12/2019, 11:38 AM

## 2019-05-13 DIAGNOSIS — K5792 Diverticulitis of intestine, part unspecified, without perforation or abscess without bleeding: Secondary | ICD-10-CM

## 2019-05-13 LAB — CBC WITH DIFFERENTIAL/PLATELET
Abs Immature Granulocytes: 0.07 10*3/uL (ref 0.00–0.07)
Basophils Absolute: 0 10*3/uL (ref 0.0–0.1)
Basophils Relative: 0 %
Eosinophils Absolute: 0.2 10*3/uL (ref 0.0–0.5)
Eosinophils Relative: 2 %
HCT: 31.5 % — ABNORMAL LOW (ref 39.0–52.0)
Hemoglobin: 9.9 g/dL — ABNORMAL LOW (ref 13.0–17.0)
Immature Granulocytes: 1 %
Lymphocytes Relative: 9 %
Lymphs Abs: 0.9 10*3/uL (ref 0.7–4.0)
MCH: 26.5 pg (ref 26.0–34.0)
MCHC: 31.4 g/dL (ref 30.0–36.0)
MCV: 84.2 fL (ref 80.0–100.0)
Monocytes Absolute: 0.9 10*3/uL (ref 0.1–1.0)
Monocytes Relative: 9 %
Neutro Abs: 7.6 10*3/uL (ref 1.7–7.7)
Neutrophils Relative %: 79 %
Platelets: 245 10*3/uL (ref 150–400)
RBC: 3.74 MIL/uL — ABNORMAL LOW (ref 4.22–5.81)
RDW: 13.4 % (ref 11.5–15.5)
WBC: 9.7 10*3/uL (ref 4.0–10.5)
nRBC: 0 % (ref 0.0–0.2)

## 2019-05-13 LAB — BASIC METABOLIC PANEL
Anion gap: 11 (ref 5–15)
BUN: 16 mg/dL (ref 8–23)
CO2: 22 mmol/L (ref 22–32)
Calcium: 8.3 mg/dL — ABNORMAL LOW (ref 8.9–10.3)
Chloride: 107 mmol/L (ref 98–111)
Creatinine, Ser: 1.46 mg/dL — ABNORMAL HIGH (ref 0.61–1.24)
GFR calc Af Amer: 56 mL/min — ABNORMAL LOW (ref >60)
GFR calc non Af Amer: 48 mL/min — ABNORMAL LOW (ref >60)
Glucose, Bld: 138 mg/dL — ABNORMAL HIGH (ref 70–99)
Potassium: 3.3 mmol/L — ABNORMAL LOW (ref 3.5–5.1)
Sodium: 140 mmol/L (ref 135–145)

## 2019-05-13 LAB — GLUCOSE, CAPILLARY
Glucose-Capillary: 128 mg/dL — ABNORMAL HIGH (ref 70–99)
Glucose-Capillary: 134 mg/dL — ABNORMAL HIGH (ref 70–99)
Glucose-Capillary: 161 mg/dL — ABNORMAL HIGH (ref 70–99)
Glucose-Capillary: 138 mg/dL — ABNORMAL HIGH (ref 70–99)

## 2019-05-13 NOTE — Progress Notes (Signed)
PROGRESS NOTE    Alexander Foster  [id number] DOB: 01-05-1951 DOA: 05/11/2019 PCP: Lucianne Lei, MD   Brief Narrative:  Per admitting attending: Betha Loa a 69 y.o.malewith medical history significant ofHTN, DM2, h/o prostate cancer, anemia, allergies who presents for abdominal pain. He reports that the pain is in the LLQ and started yesterday. He was working and was on a [employer] when it started. It progressed to an 8/10 and stayed there until getting fluids and medication in the ED. He notes that he think it may have been triggered by eating some bad ham a few days ago. Since that time he has been constipated and he feels this may have been related. He has had no chills, no vomiting, no nausea. He has had a fever up to 102F and was mildly tachycardic as well. He has no chest pain, SOB, swelling, dizziness, lightheadedness, dysuria, or changes to his urinary habits. He has had an episode of diverticulitis about 2 years ago. He reports no history of colon cancer in him or family. He has had regular colonoscopies with Dr. Collene Mares, last about 3 years ago which he reports as normal.   ED Course:In the ED, he was found to have Glucose of 158, Cr of 1.6 (baseline 1.4 - 1.6), ALT of 52, lipase 22, WBC of 14 with a neutrophil predominance. CT scan showed a mid sigmoid swelling and contained pouch with likely abscess of about 2.5cm in size.    Assessment & Plan:   Principal Problem:   Diverticulitis Active Problems:   Deficiency anemia   Diverticulitis of intestine with abscess   Diverticulitis with abscess Seen by surgery, continue treatment with antibiotics no surgical intervention currently Cipro Flagyl started on admission Monitor white blood cells which is declined Repeat KUB in the morning As needed analgesics and antiemetics  Hypertension  Home medications  Diabetes  Hold Metformin and Victoza Continue sliding scale insulin CBGs  BPH Continue  Flomax Monitor I/O's  CKD stage II Monitor renal function BUN and creatinine 16/1.46  Iron deficiency anemia Monitor hemoglobin  DVT prophylaxis: Lovenox SQ  Code Status: Full code    Code Status Orders  (From admission, onward)         Start     Ordered   05/11/19 2307  Full code  Continuous     05/11/19 2307        Code Status History    Date Active Date Inactive Code Status Order ID Comments User Context   09/03/2017 2033 09/05/2017 2153 Full Code [id number]  Bonnita Hollow, MD ED   07/13/2013 1000 07/14/2013 0334 Full Code [id number]  Arne Cleveland, MD [facility]   Advance Care Planning Activity     Family Communication: Discussed on Saturday answered wife's questions. Disposition Plan:   Patient continue to receive IV antibiotics, continued surveillance for resolution of small bowel obstruction and treating abscess.  Not yet ready for discharge Consults called: None Admission status: Inpatient   Consultants:   General surgery  Procedures:  CT Abdomen Pelvis W Contrast  Result Date: 05/11/2019 CLINICAL DATA:  Diverticulitis suspected, abdominal pain in the left lower quadrant for 1 day EXAM: CT ABDOMEN AND PELVIS WITH CONTRAST TECHNIQUE: Multidetector CT imaging of the abdomen and pelvis was performed using the standard protocol following bolus administration of intravenous contrast. CONTRAST:  140mL OMNIPAQUE IOHEXOL 300 MG/ML  SOLN COMPARISON:  CT September 03, 2017 FINDINGS: Lower chest: Lung bases are clear. Normal heart size. No pericardial effusion. Hepatobiliary:  No focal liver abnormality is seen. No gallstones, gallbladder wall thickening, or biliary dilatation. Pancreas: Partial fatty replacement of the pancreas. No pancreatic ductal dilatation or surrounding inflammatory changes. Spleen: Normal in size without focal abnormality. Adrenals/Urinary Tract: Circumscribed 1.3 cm fat containing nodule in the right adrenal gland compatible with myelolipoma. No other focal  concerning adrenal lesion. Slight thickening and periadrenal fat stranding is similar to comparison exams, a non specific finding though unchanged since 2019. Could feasibly reflect sequela of prior infection or inflammation. Mild bilateral symmetric perinephric stranding, a nonspecific finding though may correlate with either age or decreased renal function. Kidneys are otherwise unremarkable, without renal calculi, suspicious lesion, or hydronephrosis. Focal thickening along the bladder dome with fat stranding extending from the inflamed sigmoid colon, favored to be reactive. A discernible fat plane is maintained between the bladder in the adjacent inflammation. Stomach/Bowel: Distal esophagus, stomach and duodenal sweep are unremarkable. No small bowel wall thickening or dilatation. No evidence of obstruction. A normal appendix is visualized. The colon is largely stool-filled to the level of some marked focal thickening the proximal to mid sigmoid colon. There is extensive phlegmonous changes seen upon a inflamed outpouching, portion of which appears localized within the adjacent fat and is suggestive of a contained diverticular perforation (coronal 5/66). Additional intramural rim enhancing component may reflect a small intramural abscess measuring up to 2.5 cm in size (coronal 6/136). The more distal sigmoid and rectum is unremarkable. Vascular/Lymphatic: Atherosclerotic plaque within the normal caliber aorta. Likely reactive adenopathy noted in the low abdomen and pelvis. No pathologically enlarged abdominopelvic nodes. Reproductive: Multiple prostate brachytherapy implants are noted. No concerning prostate abnormality. Other: Extensive phlegmonous changes and trace free fluid in the left pericolic gutter. No extraluminal gas. Inflammation extends to the bladder dome with likely reactive thickening, as detailed above. Musculoskeletal: Multilevel degenerative changes are present in the imaged portions of the  spine. No acute osseous abnormality or suspicious osseous lesion. IMPRESSION: 1. Marked focal thickening the proximal to mid sigmoid colon with extensive phlegmonous changes seen upon a inflamed outpouching, portion of which appears localized within the adjacent fat and is suggestive of a contained diverticular perforation. Additional intramural rim enhancing component may reflect a small intramural abscess measuring up to 2.5 cm in size. Of note, this is in a region of prior diverticular disease seen on comparison CT. Furthermore, the marked thickening seen circumferentially should warrant colonoscopy to exclude underlying malignancy if not performed previously. 2. Inflammation extends to the bladder dome with likely reactive thickening. A discernible fat plane is maintained between the bladder and the adjacent inflammation which argues against potential fistulization. Recommend correlation with urinalysis to exclude cystitis. 3. 1.3 cm fat containing nodule in the right adrenal gland compatible with myelolipoma. Mild faint adrenal fat stranding is nonspecific and similar to comparison. 4. Aortic Atherosclerosis (ICD10-I70.0). Electronically Signed   By: Lovena Le M.D.   On: 05/11/2019 21:45     Antimicrobials:   Cipro Flagyl >4/17   Subjective: Patient reports pain is improved significantly, still present but less intrusive  Objective: Vitals:   05/12/19 1339 05/12/19 1852 05/12/19 2049 05/13/19 0459  BP: (!) 97/57 133/81 119/74 122/74  Pulse: 87 99 81 88  Resp: 18 18 15 20   Temp: 99.4 F (37.4 C) 100.3 F (37.9 C) 98.1 F (36.7 C) 98.6 F (37 C)  TempSrc: Oral Oral  Oral  SpO2: 98% 98% 100% 98%  Weight:      Height:  Intake/Output Summary (Last 24 hours) at 05/13/2019 1253 Last data filed at 05/13/2019 1000 Gross per 24 hour  Intake 3388.42 ml  Output 2250 ml  Net 1138.42 ml   Filed Weights   05/11/19 2022 05/11/19 2351  Weight: 99.8 kg 99.4 kg     Examination:  General exam: Appears calm and comfortable  Respiratory system: Clear to auscultation. Respiratory effort normal. Cardiovascular system: S1 & S2 heard, RRR. No JVD, murmurs, rubs, gallops or clicks. No pedal edema. Gastrointestinal system: Mildly tender to palpation diffusely no rebound no guarding, not rigid quiet bowel sounds Central nervous system: Alert and oriented. No focal neurological deficits. Extremities: Warm well perfused no edema. Skin: No rashes, lesions or ulcers Psychiatry: Judgement and insight appear normal. Mood & affect appropriate.    Data Reviewed: I have personally reviewed following labs and imaging studies  CBC: Recent Labs  Lab 05/11/19 2036 05/12/19 0457 05/13/19 0521  WBC 14.7* 13.8* 9.7  NEUTROABS 12.1*  --  7.6  HGB 11.2* 10.6* 9.9*  HCT 33.5* 31.9* 31.5*  MCV 81.1 81.2 84.2  PLT 282 263 [id number]   Basic Metabolic Panel: Recent Labs  Lab 05/11/19 2036 05/12/19 0457 05/13/19 0521  NA 139 138 140  K 3.5 3.4* 3.3*  CL 105 106 107  CO2 24 26 22   GLUCOSE 158* 201* 138*  BUN 16 16 16   CREATININE 1.60* 1.63* 1.46*  CALCIUM 8.8* 8.5* 8.3*   GFR: Estimated Creatinine Clearance: 57.3 mL/min (A) (by C-G formula based on SCr of 1.46 mg/dL (H)). Liver Function Tests: Recent Labs  Lab 05/11/19 2036  AST 37  ALT 52*  ALKPHOS 51  BILITOT 1.2  PROT 7.9  ALBUMIN 3.3*   Recent Labs  Lab 05/11/19 2036  LIPASE 22   No results for input(s): AMMONIA in the last 168 hours. Coagulation Profile: No results for input(s): INR, PROTIME in the last 168 hours. Cardiac Enzymes: No results for input(s): CKTOTAL, CKMB, CKMBINDEX, TROPONINI in the last 168 hours. BNP (last 3 results) No results for input(s): PROBNP in the last 8760 hours. HbA1C: Recent Labs    05/12/19 0457  HGBA1C 7.0*   CBG: Recent Labs  Lab 05/12/19 1116 05/12/19 1702 05/12/19 2050 05/13/19 0742 05/13/19 1153  GLUCAP 209* 181* 125* 128* 134*   Lipid  Profile: No results for input(s): CHOL, HDL, LDLCALC, TRIG, CHOLHDL, LDLDIRECT in the last 72 hours. Thyroid Function Tests: No results for input(s): TSH, T4TOTAL, FREET4, T3FREE, THYROIDAB in the last 72 hours. Anemia Panel: No results for input(s): VITAMINB12, FOLATE, FERRITIN, TIBC, IRON, RETICCTPCT in the last 72 hours. Sepsis Labs: No results for input(s): PROCALCITON, LATICACIDVEN in the last 168 hours.  Recent Results (from the past 240 hour(s))  SARS CORONAVIRUS 2 (TAT 6-24 HRS) Nasopharyngeal Nasopharyngeal Swab     Status: None   Collection Time: 05/11/19 10:20 PM   Specimen: Nasopharyngeal Swab  Result Value Ref Range Status   SARS Coronavirus 2 NEGATIVE NEGATIVE Final    Comment: (NOTE) SARS-CoV-2 target nucleic acids are NOT DETECTED. The SARS-CoV-2 RNA is generally detectable in upper and lower respiratory specimens during the acute phase of infection. Negative results do not preclude SARS-CoV-2 infection, do not rule out co-infections with other pathogens, and should not be used as the sole basis for treatment or other patient management decisions. Negative results must be combined with clinical observations, patient history, and epidemiological information. The expected result is Negative. Fact Sheet for Patients: SugarRoll.be Fact Sheet for Healthcare Providers: https://www.woods-mathews.com/ This test  is not yet approved or cleared by the [place] and  has been authorized for detection and/or diagnosis of SARS-CoV-2 by FDA under an Emergency Use Authorization (EUA). This EUA will remain  in effect (meaning this test can be used) for the duration of the COVID-19 declaration under Section 56 4(b)(1) of the Act, 21 U.S.C. section 360bbb-3(b)(1), unless the authorization is terminated or revoked sooner. Performed at [facility], [place] [address], [place], [place] [address]          Radiology Studies: CT  Abdomen Pelvis W Contrast  Result Date: 05/11/2019 CLINICAL DATA:  Diverticulitis suspected, abdominal pain in the left lower quadrant for 1 day EXAM: CT ABDOMEN AND PELVIS WITH CONTRAST TECHNIQUE: Multidetector CT imaging of the abdomen and pelvis was performed using the standard protocol following bolus administration of intravenous contrast. CONTRAST:  184mL OMNIPAQUE IOHEXOL 300 MG/ML  SOLN COMPARISON:  CT September 03, 2017 FINDINGS: Lower chest: Lung bases are clear. Normal heart size. No pericardial effusion. Hepatobiliary: No focal liver abnormality is seen. No gallstones, gallbladder wall thickening, or biliary dilatation. Pancreas: Partial fatty replacement of the pancreas. No pancreatic ductal dilatation or surrounding inflammatory changes. Spleen: Normal in size without focal abnormality. Adrenals/Urinary Tract: Circumscribed 1.3 cm fat containing nodule in the right adrenal gland compatible with myelolipoma. No other focal concerning adrenal lesion. Slight thickening and periadrenal fat stranding is similar to comparison exams, a non specific finding though unchanged since 2019. Could feasibly reflect sequela of prior infection or inflammation. Mild bilateral symmetric perinephric stranding, a nonspecific finding though may correlate with either age or decreased renal function. Kidneys are otherwise unremarkable, without renal calculi, suspicious lesion, or hydronephrosis. Focal thickening along the bladder dome with fat stranding extending from the inflamed sigmoid colon, favored to be reactive. A discernible fat plane is maintained between the bladder in the adjacent inflammation. Stomach/Bowel: Distal esophagus, stomach and duodenal sweep are unremarkable. No small bowel wall thickening or dilatation. No evidence of obstruction. A normal appendix is visualized. The colon is largely stool-filled to the level of some marked focal thickening the proximal to mid sigmoid colon. There is extensive  phlegmonous changes seen upon a inflamed outpouching, portion of which appears localized within the adjacent fat and is suggestive of a contained diverticular perforation (coronal 5/66). Additional intramural rim enhancing component may reflect a small intramural abscess measuring up to 2.5 cm in size (coronal 6/136). The more distal sigmoid and rectum is unremarkable. Vascular/Lymphatic: Atherosclerotic plaque within the normal caliber aorta. Likely reactive adenopathy noted in the low abdomen and pelvis. No pathologically enlarged abdominopelvic nodes. Reproductive: Multiple prostate brachytherapy implants are noted. No concerning prostate abnormality. Other: Extensive phlegmonous changes and trace free fluid in the left pericolic gutter. No extraluminal gas. Inflammation extends to the bladder dome with likely reactive thickening, as detailed above. Musculoskeletal: Multilevel degenerative changes are present in the imaged portions of the spine. No acute osseous abnormality or suspicious osseous lesion. IMPRESSION: 1. Marked focal thickening the proximal to mid sigmoid colon with extensive phlegmonous changes seen upon a inflamed outpouching, portion of which appears localized within the adjacent fat and is suggestive of a contained diverticular perforation. Additional intramural rim enhancing component may reflect a small intramural abscess measuring up to 2.5 cm in size. Of note, this is in a region of prior diverticular disease seen on comparison CT. Furthermore, the marked thickening seen circumferentially should warrant colonoscopy to exclude underlying malignancy if not performed previously. 2. Inflammation extends to the  bladder dome with likely reactive thickening. A discernible fat plane is maintained between the bladder and the adjacent inflammation which argues against potential fistulization. Recommend correlation with urinalysis to exclude cystitis. 3. 1.3 cm fat containing nodule in the right  adrenal gland compatible with myelolipoma. Mild faint adrenal fat stranding is nonspecific and similar to comparison. 4. Aortic Atherosclerosis (ICD10-I70.0). Electronically Signed   By: Lovena Le M.D.   On: 05/11/2019 21:45        Scheduled Meds: . enoxaparin (LOVENOX) injection  40 mg Subcutaneous Daily  . irbesartan  150 mg Oral Daily   And  . hydrochlorothiazide  12.5 mg Oral Daily  . insulin aspart  0-5 Units Subcutaneous QHS  . insulin aspart  0-9 Units Subcutaneous TID WC  . montelukast  10 mg Oral Daily  . sodium chloride flush  3 mL Intravenous Q12H  . tamsulosin  0.4 mg Oral QPC supper   Continuous Infusions: . sodium chloride 75 mL/hr at 05/13/19 0812  . ciprofloxacin 400 mg (05/13/19 0915)  . metronidazole 500 mg (05/13/19 0522)     LOS: 2 days    Time spent: 35 minutes    Nicolette Bang, MD [facility]  If 7PM-7AM, please contact night-coverage  05/13/2019, 12:53 PM

## 2019-05-13 NOTE — Progress Notes (Signed)
Diverticulitis  Subjective: Feeling better.  Pain improved.  No further BM's or flatus.  Tolerating fulls  Objective: Vital signs in last 24 hours: Temp:  [98.1 F (36.7 C)-100.3 F (37.9 C)] 98.6 F (37 C) (04/18 0459) Pulse Rate:  [81-99] 88 (04/18 0459) Resp:  [15-20] 20 (04/18 0459) BP: (97-133)/(57-81) 122/74 (04/18 0459) SpO2:  [97 %-100 %] 98 % (04/18 0459) Last BM Date: 05/12/19  Intake/Output from previous day: 04/17 0701 - 04/18 0700 In: 3388.4 [P.O.:1560; I.V.:1128.4; IV Piggyback:700] Out: 1900 [Urine:1900] Intake/Output this shift: No intake/output data recorded.  General appearance: alert and cooperative GI: soft, mildly distended, TTP LLQ  Lab Results:  Results for orders placed or performed during the hospital encounter of 05/11/19 (from the past 24 hour(s))  Glucose, capillary     Status: Abnormal   Collection Time: 05/12/19 11:16 AM  Result Value Ref Range   Glucose-Capillary 209 (H) 70 - 99 mg/dL  Glucose, capillary     Status: Abnormal   Collection Time: 05/12/19  5:02 PM  Result Value Ref Range   Glucose-Capillary 181 (H) 70 - 99 mg/dL  Glucose, capillary     Status: Abnormal   Collection Time: 05/12/19  8:50 PM  Result Value Ref Range   Glucose-Capillary 125 (H) 70 - 99 mg/dL   Comment 1 Notify RN   CBC with Differential/Platelet     Status: Abnormal   Collection Time: 05/13/19  5:21 AM  Result Value Ref Range   WBC 9.7 4.0 - 10.5 K/uL   RBC 3.74 (L) 4.22 - 5.81 MIL/uL   Hemoglobin 9.9 (L) 13.0 - 17.0 g/dL   HCT 31.5 (L) 39.0 - 52.0 %   MCV 84.2 80.0 - 100.0 fL   MCH 26.5 26.0 - 34.0 pg   MCHC 31.4 30.0 - 36.0 g/dL   RDW 13.4 11.5 - 15.5 %   Platelets 245 150 - 400 K/uL   nRBC 0.0 0.0 - 0.2 %   Neutrophils Relative % 79 %   Neutro Abs 7.6 1.7 - 7.7 K/uL   Lymphocytes Relative 9 %   Lymphs Abs 0.9 0.7 - 4.0 K/uL   Monocytes Relative 9 %   Monocytes Absolute 0.9 0.1 - 1.0 K/uL   Eosinophils Relative 2 %   Eosinophils Absolute 0.2 0.0 -  0.5 K/uL   Basophils Relative 0 %   Basophils Absolute 0.0 0.0 - 0.1 K/uL   Immature Granulocytes 1 %   Abs Immature Granulocytes 0.07 0.00 - 0.07 K/uL  Basic metabolic panel     Status: Abnormal   Collection Time: 05/13/19  5:21 AM  Result Value Ref Range   Sodium 140 135 - 145 mmol/L   Potassium 3.3 (L) 3.5 - 5.1 mmol/L   Chloride 107 98 - 111 mmol/L   CO2 22 22 - 32 mmol/L   Glucose, Bld 138 (H) 70 - 99 mg/dL   BUN 16 8 - 23 mg/dL   Creatinine, Ser 1.46 (H) 0.61 - 1.24 mg/dL   Calcium 8.3 (L) 8.9 - 10.3 mg/dL   GFR calc non Af Amer 48 (L) >60 mL/min   GFR calc Af Amer 56 (L) >60 mL/min   Anion gap 11 5 - 15  Glucose, capillary     Status: Abnormal   Collection Time: 05/13/19  7:42 AM  Result Value Ref Range   Glucose-Capillary 128 (H) 70 - 99 mg/dL     Studies/Results Radiology     MEDS, Scheduled . enoxaparin (LOVENOX) injection  40 mg  Subcutaneous Daily  . irbesartan  150 mg Oral Daily   And  . hydrochlorothiazide  12.5 mg Oral Daily  . insulin aspart  0-5 Units Subcutaneous QHS  . insulin aspart  0-9 Units Subcutaneous TID WC  . montelukast  10 mg Oral Daily  . sodium chloride flush  3 mL Intravenous Q12H  . tamsulosin  0.4 mg Oral QPC supper     Assessment: Diverticulitis   Plan: Cont IV abx Cont fulls until more bowel function and less pain Follow wbc   LOS: 2 days    Rosario Adie, MD [facility], [place]    05/13/2019 8:06 AM

## 2019-05-14 ENCOUNTER — Inpatient Hospital Stay (HOSPITAL_COMMUNITY): Payer: BC Managed Care – PPO

## 2019-05-14 LAB — BASIC METABOLIC PANEL
Anion gap: 10 (ref 5–15)
BUN: 13 mg/dL (ref 8–23)
CO2: 22 mmol/L (ref 22–32)
Calcium: 8.2 mg/dL — ABNORMAL LOW (ref 8.9–10.3)
Chloride: 105 mmol/L (ref 98–111)
Creatinine, Ser: 1.51 mg/dL — ABNORMAL HIGH (ref 0.61–1.24)
GFR calc Af Amer: 54 mL/min — ABNORMAL LOW (ref >60)
GFR calc non Af Amer: 46 mL/min — ABNORMAL LOW (ref >60)
Glucose, Bld: 135 mg/dL — ABNORMAL HIGH (ref 70–99)
Potassium: 3.3 mmol/L — ABNORMAL LOW (ref 3.5–5.1)
Sodium: 137 mmol/L (ref 135–145)

## 2019-05-14 LAB — CBC WITH DIFFERENTIAL/PLATELET
Abs Immature Granulocytes: 0.07 10*3/uL (ref 0.00–0.07)
Basophils Absolute: 0 10*3/uL (ref 0.0–0.1)
Basophils Relative: 0 %
Eosinophils Absolute: 0.2 10*3/uL (ref 0.0–0.5)
Eosinophils Relative: 2 %
HCT: 29 % — ABNORMAL LOW (ref 39.0–52.0)
Hemoglobin: 9.8 g/dL — ABNORMAL LOW (ref 13.0–17.0)
Immature Granulocytes: 1 %
Lymphocytes Relative: 12 %
Lymphs Abs: 1.1 10*3/uL (ref 0.7–4.0)
MCH: 26.8 pg (ref 26.0–34.0)
MCHC: 33.8 g/dL (ref 30.0–36.0)
MCV: 79.5 fL — ABNORMAL LOW (ref 80.0–100.0)
Monocytes Absolute: 0.8 10*3/uL (ref 0.1–1.0)
Monocytes Relative: 8 %
Neutro Abs: 7 10*3/uL (ref 1.7–7.7)
Neutrophils Relative %: 77 %
Platelets: 264 10*3/uL (ref 150–400)
RBC: 3.65 MIL/uL — ABNORMAL LOW (ref 4.22–5.81)
RDW: 13.1 % (ref 11.5–15.5)
WBC: 9.2 10*3/uL (ref 4.0–10.5)
nRBC: 0 % (ref 0.0–0.2)

## 2019-05-14 LAB — MAGNESIUM: Magnesium: 1.9 mg/dL (ref 1.7–2.4)

## 2019-05-14 LAB — GLUCOSE, CAPILLARY
Glucose-Capillary: 122 mg/dL — ABNORMAL HIGH (ref 70–99)
Glucose-Capillary: 121 mg/dL — ABNORMAL HIGH (ref 70–99)
Glucose-Capillary: 109 mg/dL — ABNORMAL HIGH (ref 70–99)
Glucose-Capillary: 120 mg/dL — ABNORMAL HIGH (ref 70–99)

## 2019-05-14 LAB — T4, FREE: Free T4: 1 ng/dL (ref 0.61–1.12)

## 2019-05-14 LAB — TSH: TSH: 1.239 u[IU]/mL (ref 0.350–4.500)

## 2019-05-14 MED ORDER — POTASSIUM CHLORIDE 10 MEQ/100ML IV SOLN
10.0000 meq | INTRAVENOUS | Status: AC
  Administered 2019-05-14 (×3): 10 meq via INTRAVENOUS
  Filled 2019-05-14: qty 100

## 2019-05-14 NOTE — Progress Notes (Signed)
PROGRESS NOTE    Alexander Foster  [id number] DOB: 1950/09/23 DOA: 05/11/2019 PCP: Lucianne Lei, MD   Brief Narrative: 69 year old male with type 2 diabetes and hypertension prostate cancer admitted with abdominal pain secondary to diverticulitis with abscess and small bowel ileus  Assessment & Plan:   Principal Problem:   Diverticulitis Active Problems:   Deficiency anemia   Diverticulitis of intestine with abscess   #1 diverticulitis with abscess and small bowel ileus-White count normalized.  Continue full liquid diet per general surgery.  Continue Cipro and Flagyl. KUB today shows mild small bowel ileus or partial obstruction. No nausea vomiting.  However he has not had a bowel movement.  He is having flatus. Encourage him to get out of bed ambulate in the hallway.  #2 history of type 2 diabetes on Metformin and Victoza prior to admission on hold continue SSI CBG (last 3)  Recent Labs    05/13/19 1635 05/13/19 2119 05/14/19 0805  GLUCAP 161* 138* 122*    #3 history of essential hypertension blood pressure 131/78 Continue Avapro, DC HCTZ he is on IV fluids  #4 history of BPH/prostate cancer- on Flomax  #5 history of stage II CKD creatinine stable creatinine 1.51  #6 history of iron deficiency anemia hemoglobin stable at 9.8  #7 hypokalemia likely secondary to HCTZ.  Replete check magnesium    Estimated body mass index is 30.56 kg/m as calculated from the following:   Height as of this encounter: 5\' 11"  (1.803 m).   Weight as of this encounter: 99.4 kg.  DVT prophylaxis: Lovenox  code Status: Full code Family Communication: Discussed with patient Disposition Plan:  Patient came from home Plan is to discharge home when he is medically stable Barriers to discharge small bowel ileus with diverticulitis and abscess Patient still n.p.o. on IV fluids followed by general surgery  Consultants:   General surgery  Procedures: None Antimicrobials: Cipro and  Flagyl  Subjective: He is sitting up by the side of the bed has had no bowel movements passing gas encouraged him to walk around ambulate as much as he can tolerate Complains of abdominal distention and abdominal pain  Objective: Vitals:   05/13/19 0459 05/13/19 1342 05/13/19 2104 05/14/19 0538  BP: 122/74 127/79 135/77 131/78  Pulse: 88 90 71 77  Resp: 20 18 18 18   Temp: 98.6 F (37 C) 99.7 F (37.6 C) 99.6 F (37.6 C) 98.4 F (36.9 C)  TempSrc: Oral Oral Oral Oral  SpO2: 98% 100% 98% 98%  Weight:      Height:        Intake/Output Summary (Last 24 hours) at 05/14/2019 1029 Last data filed at 05/14/2019 1000 Gross per 24 hour  Intake 3217.62 ml  Output 2200 ml  Net 1017.62 ml   Filed Weights   05/11/19 2022 05/11/19 2351  Weight: 99.8 kg 99.4 kg    Examination:  General exam: Appears calm and comfortable  Respiratory system: Clear to auscultation. Respiratory effort normal. Cardiovascular system: S1 & S2 heard, RRR. No JVD, murmurs, rubs, gallops or clicks. No pedal edema. Gastrointestinal system: Abdomen is distended soft and nontender. No organomegaly or masses felt.  Diminished bowel sounds heard. Central nervous system: Alert and oriented. No focal neurological deficits. Extremities: Symmetric 5 x 5 power. Skin: No rashes, lesions or ulcers Psychiatry: Judgement and insight appear normal. Mood & affect appropriate.     Data Reviewed: I have personally reviewed following labs and imaging studies  CBC: Recent Labs  Lab 05/11/19  2036 05/12/19 0457 05/13/19 0521 05/14/19 0453  WBC 14.7* 13.8* 9.7 9.2  NEUTROABS 12.1*  --  7.6 7.0  HGB 11.2* 10.6* 9.9* 9.8*  HCT 33.5* 31.9* 31.5* 29.0*  MCV 81.1 81.2 84.2 79.5*  PLT 282 263 245 [id number]   Basic Metabolic Panel: Recent Labs  Lab 05/11/19 2036 05/12/19 0457 05/13/19 0521 05/14/19 0453  NA 139 138 140 137  K 3.5 3.4* 3.3* 3.3*  CL 105 106 107 105  CO2 24 26 22 22   GLUCOSE 158* 201* 138* 135*  BUN 16 16  16 13   CREATININE 1.60* 1.63* 1.46* 1.51*  CALCIUM 8.8* 8.5* 8.3* 8.2*   GFR: Estimated Creatinine Clearance: 55.4 mL/min (A) (by C-G formula based on SCr of 1.51 mg/dL (H)). Liver Function Tests: Recent Labs  Lab 05/11/19 2036  AST 37  ALT 52*  ALKPHOS 51  BILITOT 1.2  PROT 7.9  ALBUMIN 3.3*   Recent Labs  Lab 05/11/19 2036  LIPASE 22   No results for input(s): AMMONIA in the last 168 hours. Coagulation Profile: No results for input(s): INR, PROTIME in the last 168 hours. Cardiac Enzymes: No results for input(s): CKTOTAL, CKMB, CKMBINDEX, TROPONINI in the last 168 hours. BNP (last 3 results) No results for input(s): PROBNP in the last 8760 hours. HbA1C: Recent Labs    05/12/19 0457  HGBA1C 7.0*   CBG: Recent Labs  Lab 05/13/19 0742 05/13/19 1153 05/13/19 1635 05/13/19 2119 05/14/19 0805  GLUCAP 128* 134* 161* 138* 122*   Lipid Profile: No results for input(s): CHOL, HDL, LDLCALC, TRIG, CHOLHDL, LDLDIRECT in the last 72 hours. Thyroid Function Tests: Recent Labs    05/14/19 0453  TSH 1.239  FREET4 1.00   Anemia Panel: No results for input(s): VITAMINB12, FOLATE, FERRITIN, TIBC, IRON, RETICCTPCT in the last 72 hours. Sepsis Labs: No results for input(s): PROCALCITON, LATICACIDVEN in the last 168 hours.  Recent Results (from the past 240 hour(s))  SARS CORONAVIRUS 2 (TAT 6-24 HRS) Nasopharyngeal Nasopharyngeal Swab     Status: None   Collection Time: 05/11/19 10:20 PM   Specimen: Nasopharyngeal Swab  Result Value Ref Range Status   SARS Coronavirus 2 NEGATIVE NEGATIVE Final    Comment: (NOTE) SARS-CoV-2 target nucleic acids are NOT DETECTED. The SARS-CoV-2 RNA is generally detectable in upper and lower respiratory specimens during the acute phase of infection. Negative results do not preclude SARS-CoV-2 infection, do not rule out co-infections with other pathogens, and should not be used as the sole basis for treatment or other patient management  decisions. Negative results must be combined with clinical observations, patient history, and epidemiological information. The expected result is Negative. Fact Sheet for Patients: SugarRoll.be Fact Sheet for Healthcare Providers: https://www.[employer] [employer].com/ This test is not yet approved or cleared by the [place] FDA and  has been authorized for detection and/or diagnosis of SARS-CoV-2 by FDA under an Emergency Use Authorization (EUA). This EUA will remain  in effect (meaning this test can be used) for the duration of the COVID-19 declaration under Section 56 4(b)(1) of the Act, 21 U.S.C. section 360bbb-3(b)(1), unless the authorization is terminated or revoked sooner. Performed at [facility], [place] [address], [place], [place] [address]          Radiology Studies: DG Abd 1 View  Result Date: 05/14/2019 CLINICAL DATA:  Small-bowel obstruction. EXAM: ABDOMEN - 1 VIEW COMPARISON:  Abdomen pelvis CT dated 05/11/2019 FINDINGS: There is a mildly dilated loop of jejunum in the left lower abdomen without  significant change. Previously demonstrated borderline dilatation of loops of jejunum in the left mid abdomen is no longer seen. Gas and stool in normal caliber colon. Lumbar spine degenerative changes. IMPRESSION: Improving mild small bowel ileus or partial obstruction. Electronically Signed   By: Claudie Revering M.D.   On: 05/14/2019 08:00        Scheduled Meds: . enoxaparin (LOVENOX) injection  40 mg Subcutaneous Daily  . irbesartan  150 mg Oral Daily   And  . hydrochlorothiazide  12.5 mg Oral Daily  . insulin aspart  0-5 Units Subcutaneous QHS  . insulin aspart  0-9 Units Subcutaneous TID WC  . montelukast  10 mg Oral Daily  . sodium chloride flush  3 mL Intravenous Q12H  . tamsulosin  0.4 mg Oral QPC supper   Continuous Infusions: . sodium chloride 75 mL/hr at 05/13/19 2149  . ciprofloxacin 400 mg (05/14/19 0955)  .  metronidazole 500 mg (05/14/19 0527)     LOS: 3 days      Georgette Shell, MD  05/14/2019, 10:29 AM

## 2019-05-14 NOTE — Progress Notes (Signed)
Patient ID: Alexander Foster, male   DOB: 1950/05/23, 69 y.o.   MRN: [id number]       Subjective: Feels about the same.  Having to take oral pain meds to control pain which is usually around a 6.  Taking in full liquids with no worsening pain, but pain hasn't improved much since admission unless he takes pain meds.  No BM in 2 days.  ROS: See above, otherwise other systems negative  Objective: Vital signs in last 24 hours: Temp:  [98.4 F (36.9 C)-99.7 F (37.6 C)] 98.4 F (36.9 C) (04/19 0538) Pulse Rate:  [71-90] 77 (04/19 0538) Resp:  [18] 18 (04/19 0538) BP: (127-135)/(77-79) 131/78 (04/19 0538) SpO2:  [98 %-100 %] 98 % (04/19 0538) Last BM Date: 05/12/19  Intake/Output from previous day: 04/18 0701 - 04/19 0700 In: 3681.8 [P.O.:1540; I.V.:1382; IV Piggyback:759.9] Out: 2550 [Urine:2550] Intake/Output this shift: Total I/O In: 240 [P.O.:240] Out: 0   PE: Heart: regular Lungs: CTAB Abd: soft, tender in LLQ and suprapubic region, but no guarding or peritoneal signs, few BS  Lab Results:  Recent Labs    05/13/19 0521 05/14/19 0453  WBC 9.7 9.2  HGB 9.9* 9.8*  HCT 31.5* 29.0*  PLT 245 264   BMET Recent Labs    05/13/19 0521 05/14/19 0453  NA 140 137  K 3.3* 3.3*  CL 107 105  CO2 22 22  GLUCOSE 138* 135*  BUN 16 13  CREATININE 1.46* 1.51*  CALCIUM 8.3* 8.2*   PT/INR No results for input(s): LABPROT, INR in the last 72 hours. CMP     Component Value Date/Time   NA 137 05/14/2019 0453   K 3.3 (L) 05/14/2019 0453   CL 105 05/14/2019 0453   CO2 22 05/14/2019 0453   GLUCOSE 135 (H) 05/14/2019 0453   BUN 13 05/14/2019 0453   CREATININE 1.51 (H) 05/14/2019 0453   CALCIUM 8.2 (L) 05/14/2019 0453   PROT 7.9 05/11/2019 2036   ALBUMIN 3.3 (L) 05/11/2019 2036   AST 37 05/11/2019 2036   ALT 52 (H) 05/11/2019 2036   ALKPHOS 51 05/11/2019 2036   BILITOT 1.2 05/11/2019 2036   GFRNONAA 46 (L) 05/14/2019 0453   GFRAA 54 (L) 05/14/2019 0453   Lipase       Component Value Date/Time   LIPASE 22 05/11/2019 2036       Studies/Results: DG Abd 1 View  Result Date: 05/14/2019 CLINICAL DATA:  Small-bowel obstruction. EXAM: ABDOMEN - 1 VIEW COMPARISON:  Abdomen pelvis CT dated 05/11/2019 FINDINGS: There is a mildly dilated loop of jejunum in the left lower abdomen without significant change. Previously demonstrated borderline dilatation of loops of jejunum in the left mid abdomen is no longer seen. Gas and stool in normal caliber colon. Lumbar spine degenerative changes. IMPRESSION: Improving mild small bowel ileus or partial obstruction. Electronically Signed   By: Claudie Revering M.D.   On: 05/14/2019 08:00    Anti-infectives: Anti-infectives (From admission, onward)   Start     Dose/Rate Route Frequency Ordered Stop   05/12/19 1000  ciprofloxacin (CIPRO) IVPB 400 mg     400 mg 200 mL/hr over 60 Minutes Intravenous Every 12 hours 05/11/19 2339     05/12/19 0600  metroNIDAZOLE (FLAGYL) IVPB 500 mg     500 mg 100 mL/hr over 60 Minutes Intravenous Every 8 hours 05/12/19 0010     05/11/19 2200  ciprofloxacin (CIPRO) IVPB 400 mg     400 mg 200 mL/hr over 60 Minutes  Intravenous  Once 05/11/19 2152 05/11/19 2310   05/11/19 2200  metroNIDAZOLE (FLAGYL) IVPB 500 mg     500 mg 100 mL/hr over 60 Minutes Intravenous  Once 05/11/19 2152 05/11/19 2310       Assessment/Plan HTN DM  Diverticulitis with intramural abscess -patient's pain is about the same and controlled right now with pain medications.  We discussed that ideally we would like for him to improve enough that he doesn't require pain medications at discharge -cont full liquids due to persistent pain -cont IV abx therapy -cont to closely monitor -WBC normal  FEN - FLD VTE - Lovenox ID - C/F   LOS: 3 days    Henreitta Cea , [facility] Surgery 05/14/2019, 10:51 AM Please see Amion for pager number during day hours 7:00am-4:30pm or 7:00am -11:30am on weekends

## 2019-05-15 LAB — CBC WITH DIFFERENTIAL/PLATELET
Abs Immature Granulocytes: 0.11 10*3/uL — ABNORMAL HIGH (ref 0.00–0.07)
Basophils Absolute: 0 10*3/uL (ref 0.0–0.1)
Basophils Relative: 0 %
Eosinophils Absolute: 0.2 10*3/uL (ref 0.0–0.5)
Eosinophils Relative: 2 %
HCT: 29.4 % — ABNORMAL LOW (ref 39.0–52.0)
Hemoglobin: 9.8 g/dL — ABNORMAL LOW (ref 13.0–17.0)
Immature Granulocytes: 1 %
Lymphocytes Relative: 14 %
Lymphs Abs: 1.1 10*3/uL (ref 0.7–4.0)
MCH: 26.5 pg (ref 26.0–34.0)
MCHC: 33.3 g/dL (ref 30.0–36.0)
MCV: 79.5 fL — ABNORMAL LOW (ref 80.0–100.0)
Monocytes Absolute: 0.7 10*3/uL (ref 0.1–1.0)
Monocytes Relative: 9 %
Neutro Abs: 5.6 10*3/uL (ref 1.7–7.7)
Neutrophils Relative %: 74 %
Platelets: 305 10*3/uL (ref 150–400)
RBC: 3.7 MIL/uL — ABNORMAL LOW (ref 4.22–5.81)
RDW: 13.3 % (ref 11.5–15.5)
WBC: 7.7 10*3/uL (ref 4.0–10.5)
nRBC: 0 % (ref 0.0–0.2)

## 2019-05-15 LAB — GLUCOSE, CAPILLARY
Glucose-Capillary: 117 mg/dL — ABNORMAL HIGH (ref 70–99)
Glucose-Capillary: 131 mg/dL — ABNORMAL HIGH (ref 70–99)
Glucose-Capillary: 115 mg/dL — ABNORMAL HIGH (ref 70–99)
Glucose-Capillary: 156 mg/dL — ABNORMAL HIGH (ref 70–99)

## 2019-05-15 LAB — BASIC METABOLIC PANEL
Anion gap: 10 (ref 5–15)
BUN: 13 mg/dL (ref 8–23)
CO2: 21 mmol/L — ABNORMAL LOW (ref 22–32)
Calcium: 8.4 mg/dL — ABNORMAL LOW (ref 8.9–10.3)
Chloride: 107 mmol/L (ref 98–111)
Creatinine, Ser: 1.42 mg/dL — ABNORMAL HIGH (ref 0.61–1.24)
GFR calc Af Amer: 58 mL/min — ABNORMAL LOW (ref >60)
GFR calc non Af Amer: 50 mL/min — ABNORMAL LOW (ref >60)
Glucose, Bld: 119 mg/dL — ABNORMAL HIGH (ref 70–99)
Potassium: 3.6 mmol/L (ref 3.5–5.1)
Sodium: 138 mmol/L (ref 135–145)

## 2019-05-15 NOTE — Progress Notes (Signed)
Patient ID: Alexander Foster, male   DOB: [birth year], 69 y.o.   MRN: [id number]       Subjective: Feeling better.  Not taking any more po pain meds.  Tolerating full liquids still.  Most pain he has is when sitting up in a "crunch" type fashion.  ROS: See above, otherwise other systems negative  Objective: Vital signs in last 24 hours: Temp:  [98.3 F (36.8 C)-99.1 F (37.3 C)] 98.3 F (36.8 C) (04/20 0519) Pulse Rate:  [76-85] 76 (04/20 0519) Resp:  [18] 18 (04/20 0519) BP: (105-163)/(66-90) 105/66 (04/20 0519) SpO2:  [97 %-100 %] 97 % (04/20 0519) Last BM Date: 05/14/19  Intake/Output from previous day: 04/19 0701 - 04/20 0700 In: 2787.7 [P.O.:1370; I.V.:711.6; IV [id number] Out: 2651 [Urine:2650; Stool:1] Intake/Output this shift: No intake/output data recorded.  PE: Heart: regular Lungs: CTAB Abd: soft, mildly tender in LLQ, but improved, +BS, ND  Lab Results:  Recent Labs    05/14/19 0453 05/15/19 0417  WBC 9.2 7.7  HGB 9.8* 9.8*  HCT 29.0* 29.4*  PLT 264 305   BMET Recent Labs    05/14/19 0453 05/15/19 0417  NA 137 138  K 3.3* 3.6  CL 105 107  CO2 22 21*  GLUCOSE 135* 119*  BUN 13 13  CREATININE 1.51* 1.42*  CALCIUM 8.2* 8.4*   PT/INR No results for input(s): LABPROT, INR in the last 72 hours. CMP     Component Value Date/Time   NA 138 05/15/2019 0417   K 3.6 05/15/2019 0417   CL 107 05/15/2019 0417   CO2 21 (L) 05/15/2019 0417   GLUCOSE 119 (H) 05/15/2019 0417   BUN 13 05/15/2019 0417   CREATININE 1.42 (H) 05/15/2019 0417   CALCIUM 8.4 (L) 05/15/2019 0417   PROT 7.9 05/11/2019 2036   ALBUMIN 3.3 (L) 05/11/2019 2036   AST 37 05/11/2019 2036   ALT 52 (H) 05/11/2019 2036   ALKPHOS 51 05/11/2019 2036   BILITOT 1.2 05/11/2019 2036   GFRNONAA 50 (L) 05/15/2019 0417   GFRAA 58 (L) 05/15/2019 0417   Lipase     Component Value Date/Time   LIPASE 22 05/11/2019 2036       Studies/Results: DG Abd 1 View  Result Date:  05/14/2019 CLINICAL DATA:  Small-bowel obstruction. EXAM: ABDOMEN - 1 VIEW COMPARISON:  Abdomen pelvis CT dated 05/11/2019 FINDINGS: There is a mildly dilated loop of jejunum in the left lower abdomen without significant change. Previously demonstrated borderline dilatation of loops of jejunum in the left mid abdomen is no longer seen. Gas and stool in normal caliber colon. Lumbar spine degenerative changes. IMPRESSION: Improving mild small bowel ileus or partial obstruction. Electronically Signed   By: Claudie Revering M.D.   On: 05/14/2019 08:00    Anti-infectives: Anti-infectives (From admission, onward)   Start     Dose/Rate Route Frequency Ordered Stop   05/12/19 1000  ciprofloxacin (CIPRO) IVPB 400 mg     400 mg 200 mL/hr over 60 Minutes Intravenous Every 12 hours 05/11/19 2339     05/12/19 0600  metroNIDAZOLE (FLAGYL) IVPB 500 mg     500 mg 100 mL/hr over 60 Minutes Intravenous Every 8 hours 05/12/19 0010     05/11/19 2200  ciprofloxacin (CIPRO) IVPB 400 mg     400 mg 200 mL/hr over 60 Minutes Intravenous  Once 05/11/19 2152 05/11/19 2310   05/11/19 2200  metroNIDAZOLE (FLAGYL) IVPB 500 mg     500 mg 100 mL/hr over 60 Minutes  Intravenous  Once 05/11/19 2152 05/11/19 2310       Assessment/Plan HTN DM  Diverticulitis with intramural abscess -pain improving -adv to a soft diet -dietitian for diet teaching -if tolerates well today, can likely go home tomorrow and follow up with [place] as an outpatient in about a month.  He had a cscope he believe about 2-3 yrs ago already. -will follow  FEN - soft Diet VTE - Lovenox ID - C/F   LOS: 4 days    Henreitta Cea , [facility] Surgery 05/15/2019, 9:25 AM Please see Amion for pager number during day hours 7:00am-4:30pm or 7:00am -11:30am on weekends

## 2019-05-15 NOTE — Progress Notes (Signed)
Pharmacy Antibiotic Note  Alexander Foster is a 69 y.o. male admitted on 05/11/2019 with intra-abdominal infection.  Pharmacy has been consulted for cipro dosing. 05/15/2019  D#4 full abx.   WBC 7.7, Afebrile, SCr down to 1.42, tolerating FLD, to advance to soft diet  Plan: Cipro 400 mg IV q12h Flagyl 500 mg IV q8h Anticipate change to oral abx soon   Height: 5\' 11"  (180.3 cm) Weight: 99.4 kg (219 lb 2.2 oz) IBW/kg (Calculated) : 75.3  Temp (24hrs), Avg:98.6 F (37 C), Min:98.3 F (36.8 C), Max:99.1 F (37.3 C)  Recent Labs  Lab 05/11/19 2036 05/12/19 0457 05/13/19 0521 05/14/19 0453 05/15/19 0417  WBC 14.7* 13.8* 9.7 9.2 7.7  CREATININE 1.60* 1.63* 1.46* 1.51* 1.42*    Estimated Creatinine Clearance: 59 mL/min (A) (by C-G formula based on SCr of 1.42 mg/dL (H)).    No Known Allergies  Antimicrobials this admission: 4/16 cipro >>  4/16 flagyl >>  Dose adjustments this admission:  Microbiology results:  Thank you for allowing pharmacy to be a part of this patient's care.  Eudelia Bunch, Pharm.D [phone] 05/15/2019 10:34 AM

## 2019-05-15 NOTE — Progress Notes (Signed)
PROGRESS NOTE    Alexander Foster  [name] DOB: [birth year] DOA: 05/11/2019 PCP: Lucianne Lei, MD   Brief Narrative:69 year old male with type 2 diabetes and hypertension prostate cancer admitted with abdominal pain secondary to diverticulitis with abscess and small bowel ileus  Assessment & Plan:   Principal Problem:   Diverticulitis Active Problems:   Deficiency anemia   Diverticulitis of intestine with abscess   #1 diverticulitis with abscess and small bowel ileus-White count normalized. Patient had few small bowel movements with no nausea vomiting.  Pain is better today.  Surgery advancing diet to soft diet.   Continue Cipro and Flagyl. KUB today shows mild small bowel ileus or partial obstruction. No nausea vomiting.  However he has not had a bowel movement.  He is having flatus. Encourage him to get out of bed ambulate in the hallway. Hopefully he can be discharged in a.m. if he tolerates diet.  #2 history of type 2 diabetes on Metformin and Victoza prior to admission on hold continue SSI CBG (last 3)  Recent Labs    05/14/19 2151 05/15/19 0730 05/15/19 1142  GLUCAP 120* 117* 131*      #3 history of essential hypertension blood pressure 105/66.  Hold Avapro.  #4 history of BPH/prostate cancer- on Flomax  #5 history of stage II CKD creatinine stable creatinine 1.51  #6 history of iron deficiency anemia hemoglobin stable at 9.8  #7 hypokalemia likely secondary to HCTZ.  Resolved. Replete check magnesium     Estimated body mass index is 30.56 kg/m as calculated from the following:   Height as of this encounter: 5\' 11"  (1.803 m).   Weight as of this encounter: 99.4 kg.  DVT prophylaxis: Lovenox Code Status: Full code  family Communication: Discussed with patient disposition Plan:  Status is: Inpatient  Dispo: The patient is from: Home              Anticipated d/c is to: Home              Anticipated d/c date is: 05/16/2019  Patient currently is not medically stable to d/c.  Patient admitted with small bowel obstruction and diverticulitis just starting soft diet today if he tolerates he will be discharged home in a.m.    Consultants:   General surgery  Procedures: None Antimicrobials: None  Subjective: Patient resting in bed reports his abdominal pain is better no nausea vomiting passing gas had few small bowel movements  Objective: Vitals:   05/14/19 0538 05/14/19 1355 05/14/19 2041 05/15/19 0519  BP: 131/78 133/85 (!) 163/90 105/66  Pulse: 77 85 82 76  Resp: 18 18 18 18   Temp: 98.4 F (36.9 C) 99.1 F (37.3 C) 98.4 F (36.9 C) 98.3 F (36.8 C)  TempSrc: Oral Oral Oral Oral  SpO2: 98% 100% 100% 97%  Weight:      Height:        Intake/Output Summary (Last 24 hours) at 05/15/2019 1321 Last data filed at 05/15/2019 1019 Gross per 24 hour  Intake 2787.65 ml  Output 3051 ml  Net -263.35 ml   Filed Weights   05/11/19 2022 05/11/19 2351  Weight: 99.8 kg 99.4 kg    Examination:  General exam: Appears calm and comfortable  Respiratory system: Clear to auscultation. Respiratory effort normal. Cardiovascular system: S1 & S2 heard, RRR. No JVD, murmurs, rubs, gallops or clicks. No pedal edema. Gastrointestinal system: Abdomen is nondistended, soft and nontender. No organomegaly or masses felt. Normal bowel sounds heard. Central nervous system:  Alert and oriented. No focal neurological deficits. Extremities: Symmetric 5 x 5 power. Skin: No rashes, lesions or ulcers Psychiatry: Judgement and insight appear normal. Mood & affect appropriate.     Data Reviewed: I have personally reviewed following labs and imaging studies  CBC: Recent Labs  Lab 05/11/19 2036 05/12/19 0457 05/13/19 0521 05/14/19 0453 05/15/19 0417  WBC 14.7* 13.8* 9.7 9.2 7.7  NEUTROABS 12.1*  --  7.6 7.0 5.6  HGB 11.2* 10.6* 9.9* 9.8* 9.8*  HCT 33.5* 31.9* 31.5* 29.0* 29.4*  MCV 81.1 81.2 84.2 79.5* 79.5*  PLT 282  263 245 264 [id number]   Basic Metabolic Panel: Recent Labs  Lab 05/11/19 2036 05/12/19 0457 05/13/19 0521 05/14/19 0453 05/14/19 0509 05/15/19 0417  NA 139 138 140 137  --  138  K 3.5 3.4* 3.3* 3.3*  --  3.6  CL 105 106 107 105  --  107  CO2 24 26 22 22   --  21*  GLUCOSE 158* 201* 138* 135*  --  119*  BUN 16 16 16 13   --  13  CREATININE 1.60* 1.63* 1.46* 1.51*  --  1.42*  CALCIUM 8.8* 8.5* 8.3* 8.2*  --  8.4*  MG  --   --   --   --  1.9  --    GFR: Estimated Creatinine Clearance: 59 mL/min (A) (by C-G formula based on SCr of 1.42 mg/dL (H)). Liver Function Tests: Recent Labs  Lab 05/11/19 2036  AST 37  ALT 52*  ALKPHOS 51  BILITOT 1.2  PROT 7.9  ALBUMIN 3.3*   Recent Labs  Lab 05/11/19 2036  LIPASE 22   No results for input(s): AMMONIA in the last 168 hours. Coagulation Profile: No results for input(s): INR, PROTIME in the last 168 hours. Cardiac Enzymes: No results for input(s): CKTOTAL, CKMB, CKMBINDEX, TROPONINI in the last 168 hours. BNP (last 3 results) No results for input(s): PROBNP in the last 8760 hours. HbA1C: No results for input(s): HGBA1C in the last 72 hours. CBG: Recent Labs  Lab 05/14/19 1153 05/14/19 1627 05/14/19 2151 05/15/19 0730 05/15/19 1142  GLUCAP 121* 109* 120* 117* 131*   Lipid Profile: No results for input(s): CHOL, HDL, LDLCALC, TRIG, CHOLHDL, LDLDIRECT in the last 72 hours. Thyroid Function Tests: Recent Labs    05/14/19 0453  TSH 1.239  FREET4 1.00   Anemia Panel: No results for input(s): VITAMINB12, FOLATE, FERRITIN, TIBC, IRON, RETICCTPCT in the last 72 hours. Sepsis Labs: No results for input(s): PROCALCITON, LATICACIDVEN in the last 168 hours.  Recent Results (from the past 240 hour(s))  SARS CORONAVIRUS 2 (TAT 6-24 HRS) Nasopharyngeal Nasopharyngeal Swab     Status: None   Collection Time: 05/11/19 10:20 PM   Specimen: Nasopharyngeal Swab  Result Value Ref Range Status   SARS Coronavirus 2 NEGATIVE NEGATIVE  Final    Comment: (NOTE) SARS-CoV-2 target nucleic acids are NOT DETECTED. The SARS-CoV-2 RNA is generally detectable in upper and lower respiratory specimens during the acute phase of infection. Negative results do not preclude SARS-CoV-2 infection, do not rule out co-infections with other pathogens, and should not be used as the sole basis for treatment or other patient management decisions. Negative results must be combined with clinical observations, patient history, and epidemiological information. The expected result is Negative. Fact Sheet for Patients: SugarRoll.be Fact Sheet for Healthcare Providers: https://www.woods-[employer]inn [employer]ling.com/ This test is not yet approved or cleared by the [place] FDA and  has been authorized for detection and/or diagnosis of SARS-CoV-2  by FDA under an Emergency Use Authorization (EUA). This EUA will remain  in effect (meaning this test can be used) for the duration of the COVID-19 declaration under Section 56 4(b)(1) of the Act, 21 U.S.C. section 360bbb-3(b)(1), unless the authorization is terminated or revoked sooner. Performed at [facility], [place] [address], [place], [place] [address]          Radiology Studies: DG Abd 1 View  Result Date: 05/14/2019 CLINICAL DATA:  Small-bowel obstruction. EXAM: ABDOMEN - 1 VIEW COMPARISON:  Abdomen pelvis CT dated 05/11/2019 FINDINGS: There is a mildly dilated loop of jejunum in the left lower abdomen without significant change. Previously demonstrated borderline dilatation of loops of jejunum in the left mid abdomen is no longer seen. Gas and stool in normal caliber colon. Lumbar spine degenerative changes. IMPRESSION: Improving mild small bowel ileus or partial obstruction. Electronically Signed   By: Claudie Revering M.D.   On: 05/14/2019 08:00        Scheduled Meds: . enoxaparin (LOVENOX) injection  40 mg Subcutaneous Daily  . insulin aspart  0-5 Units  Subcutaneous QHS  . insulin aspart  0-9 Units Subcutaneous TID WC  . irbesartan  150 mg Oral Daily  . montelukast  10 mg Oral Daily  . sodium chloride flush  3 mL Intravenous Q12H  . tamsulosin  0.4 mg Oral QPC supper   Continuous Infusions: . sodium chloride 75 mL/hr at 05/14/19 1948  . ciprofloxacin 400 mg (05/15/19 1003)  . metronidazole 500 mg (05/15/19 0517)     LOS: 4 days      Georgette Shell, MD  05/15/2019, 1:21 PM

## 2019-05-16 LAB — BASIC METABOLIC PANEL
Anion gap: 9 (ref 5–15)
BUN: 18 mg/dL (ref 8–23)
CO2: 22 mmol/L (ref 22–32)
Calcium: 8.2 mg/dL — ABNORMAL LOW (ref 8.9–10.3)
Chloride: 109 mmol/L (ref 98–111)
Creatinine, Ser: 1.43 mg/dL — ABNORMAL HIGH (ref 0.61–1.24)
GFR calc Af Amer: 58 mL/min — ABNORMAL LOW (ref >60)
GFR calc non Af Amer: 50 mL/min — ABNORMAL LOW (ref >60)
Glucose, Bld: 140 mg/dL — ABNORMAL HIGH (ref 70–99)
Potassium: 3.6 mmol/L (ref 3.5–5.1)
Sodium: 140 mmol/L (ref 135–145)

## 2019-05-16 LAB — GLUCOSE, CAPILLARY
Glucose-Capillary: 125 mg/dL — ABNORMAL HIGH (ref 70–99)
Glucose-Capillary: 139 mg/dL — ABNORMAL HIGH (ref 70–99)

## 2019-05-16 MED ORDER — CIPROFLOXACIN HCL 500 MG PO TABS: 500.0000 mg | ORAL_TABLET | Freq: Two times a day (BID) | ORAL | 0 refills | Status: AC

## 2019-05-16 MED ORDER — METRONIDAZOLE 500 MG PO TABS: 500.0000 mg | ORAL_TABLET | Freq: Three times a day (TID) | ORAL | 0 refills | Status: AC

## 2019-05-16 NOTE — Discharge Summary (Addendum)
Physician Discharge Summary  Alexander Foster [id number] DOB: 1950-05-11 DOA: 05/11/2019  PCP: Lucianne Lei, MD  Admit date: 05/11/2019 Discharge date: 05/16/2019  Admitted From home Disposition: Home Recommendations for Outpatient Follow-up:  1. Follow up with PCP in 1-2 weeks 2. Please obtain BMP/CBC in one week 3. Please follow up with [place] surgery  Home Health none  equipment/Devices: None Discharge Condition: Stable and improved CODE STATUS: Full code Diet recommendation: Cardiac diet Brief/Interim Summary:69 year old male with type 2 diabetes and hypertension prostate cancer admitted with abdominal pain secondary to diverticulitis with abscess and small bowel ileus   Discharge Diagnoses:  Principal Problem:   Diverticulitis Active Problems:   Deficiency anemia   Diverticulitis of intestine with abscess   #1 diverticulitis with abscess and small bowel ileus-patient was treated with n.p.o., IV fluids, IV Cipro and Flagyl.  He tolerated a diet prior to discharge.  He will follow-up with [facility].  Discharge him on Cipro and Flagyl to continue as outpatient to finish the course of 14 days.   CT of the abdomen this admission Marked focal thickening the proximal to mid sigmoid colon with extensive phlegmonous changes seen upon a inflamed outpouching, portion of which appears localized within the adjacent fat and is suggestive of a contained diverticular perforation. Additional intramural rim enhancing component may reflect a small intramural abscess measuring up to 2.5 cm in size. Of note, this is in a region of prior diverticular disease seen on comparison CT. Furthermore, the marked thickening seen circumferentially should warrant colonoscopy to exclude underlying malignancy if not performed..  #2 history of type 2 diabetes on Metformin and Victoza   #3 history of essential hypertension-continue home med  #4 history of BPH/prostate  cancer-on Flomax  #5 history of stage II CKD creatinine stable creatinine 1.51  #6 history of iron deficiency anemia hemoglobin stable at 9.8  #7 hypokalemia likely secondary to HCTZ.  Resolved.   Estimated body mass index is 30.56 kg/m as calculated from the following:   Height as of this encounter: 5\' 11"  (1.803 m).   Weight as of this encounter: 99.4 kg.  Discharge Instructions  Discharge Instructions    Diet - low sodium heart healthy   Complete by: As directed    Increase activity slowly   Complete by: As directed      Allergies as of 05/16/2019   No Known Allergies     Medication List    TAKE these medications   AndroGel Pump 20.25 MG/ACT (1.62%) Gel Generic drug: Testosterone Apply 2 application topically daily.   aspirin 81 MG chewable tablet Chew 81 mg by mouth daily.   ciprofloxacin 500 MG tablet Commonly known as: Cipro Take 1 tablet (500 mg total) by mouth 2 (two) times daily for 9 days.   Fusion Plus Caps Take 1 capsule by mouth daily.   metFORMIN 1000 MG tablet Commonly known as: GLUCOPHAGE Take 1,000 mg by mouth 2 (two) times daily with a meal.   metroNIDAZOLE 500 MG tablet Commonly known as: Flagyl Take 1 tablet (500 mg total) by mouth 3 (three) times daily for 9 days.   montelukast 10 MG tablet Commonly known as: SINGULAIR Take 10 mg by mouth daily.   multivitamin with minerals Tabs tablet Take 1 tablet by mouth daily.   niacin 500 MG CR tablet Commonly known as: NIASPAN Take 500 mg by mouth at bedtime.   tamsulosin 0.4 MG Caps capsule Commonly known as: FLOMAX Take 0.4 mg by mouth daily after  supper.   Tribenzor 20-5-12.5 MG Tabs Generic drug: Olmesartan-amLODIPine-HCTZ Take 1 tablet by mouth daily.   Victoza 18 MG/3ML Sopn Generic drug: liraglutide Inject 18 Units into the skin daily.      Follow-up Information    Leighton Ruff, MD Follow up in 1 month(s).   Specialty: General Surgery Why: our office will call you  with appointment date and time Contact information: [place] [address] [id number]        Lucianne Lei, MD Follow up.   Specialty: Family Medicine Contact information: [address] [place] [address] [phone]          No Known Allergies  Consultations: surgery  Procedures/Studies: DG Abd 1 View  Result Date: 05/14/2019 CLINICAL DATA:  Small-bowel obstruction. EXAM: ABDOMEN - 1 VIEW COMPARISON:  Abdomen pelvis CT dated 05/11/2019 FINDINGS: There is a mildly dilated loop of jejunum in the left lower abdomen without significant change. Previously demonstrated borderline dilatation of loops of jejunum in the left mid abdomen is no longer seen. Gas and stool in normal caliber colon. Lumbar spine degenerative changes. IMPRESSION: Improving mild small bowel ileus or partial obstruction. Electronically Signed   By: Claudie Revering M.D.   On: 05/14/2019 08:00   CT Abdomen Pelvis W Contrast  Result Date: 05/11/2019 CLINICAL DATA:  Diverticulitis suspected, abdominal pain in the left lower quadrant for 1 day EXAM: CT ABDOMEN AND PELVIS WITH CONTRAST TECHNIQUE: Multidetector CT imaging of the abdomen and pelvis was performed using the standard protocol following bolus administration of intravenous contrast. CONTRAST:  158mL OMNIPAQUE IOHEXOL 300 MG/ML  SOLN COMPARISON:  CT September 03, 2017 FINDINGS: Lower chest: Lung bases are clear. Normal heart size. No pericardial effusion. Hepatobiliary: No focal liver abnormality is seen. No gallstones, gallbladder wall thickening, or biliary dilatation. Pancreas: Partial fatty replacement of the pancreas. No pancreatic ductal dilatation or surrounding inflammatory changes. Spleen: Normal in size without focal abnormality. Adrenals/Urinary Tract: Circumscribed 1.3 cm fat containing nodule in the right adrenal gland compatible with myelolipoma. No other focal concerning adrenal lesion. Slight thickening and periadrenal  fat stranding is similar to comparison exams, a non specific finding though unchanged since 2019. Could feasibly reflect sequela of prior infection or inflammation. Mild bilateral symmetric perinephric stranding, a nonspecific finding though may correlate with either age or decreased renal function. Kidneys are otherwise unremarkable, without renal calculi, suspicious lesion, or hydronephrosis. Focal thickening along the bladder dome with fat stranding extending from the inflamed sigmoid colon, favored to be reactive. A discernible fat plane is maintained between the bladder in the adjacent inflammation. Stomach/Bowel: Distal esophagus, stomach and duodenal sweep are unremarkable. No small bowel wall thickening or dilatation. No evidence of obstruction. A normal appendix is visualized. The colon is largely stool-filled to the level of some marked focal thickening the proximal to mid sigmoid colon. There is extensive phlegmonous changes seen upon a inflamed outpouching, portion of which appears localized within the adjacent fat and is suggestive of a contained diverticular perforation (coronal 5/66). Additional intramural rim enhancing component may reflect a small intramural abscess measuring up to 2.5 cm in size (coronal 6/136). The more distal sigmoid and rectum is unremarkable. Vascular/Lymphatic: Atherosclerotic plaque within the normal caliber aorta. Likely reactive adenopathy noted in the low abdomen and pelvis. No pathologically enlarged abdominopelvic nodes. Reproductive: Multiple prostate brachytherapy implants are noted. No concerning prostate abnormality. Other: Extensive phlegmonous changes and trace free fluid in the left pericolic gutter. No extraluminal gas.  Inflammation extends to the bladder dome with likely reactive thickening, as detailed above. Musculoskeletal: Multilevel degenerative changes are present in the imaged portions of the spine. No acute osseous abnormality or suspicious osseous  lesion. IMPRESSION: 1. Marked focal thickening the proximal to mid sigmoid colon with extensive phlegmonous changes seen upon a inflamed outpouching, portion of which appears localized within the adjacent fat and is suggestive of a contained diverticular perforation. Additional intramural rim enhancing component may reflect a small intramural abscess measuring up to 2.5 cm in size. Of note, this is in a region of prior diverticular disease seen on comparison CT. Furthermore, the marked thickening seen circumferentially should warrant colonoscopy to exclude underlying malignancy if not performed previously. 2. Inflammation extends to the bladder dome with likely reactive thickening. A discernible fat plane is maintained between the bladder and the adjacent inflammation which argues against potential fistulization. Recommend correlation with urinalysis to exclude cystitis. 3. 1.3 cm fat containing nodule in the right adrenal gland compatible with myelolipoma. Mild faint adrenal fat stranding is nonspecific and similar to comparison. 4. Aortic Atherosclerosis (ICD10-I70.0). Electronically Signed   By: Lovena Le M.D.   On: 05/11/2019 21:45   (Echo, Carotid, EGD, Colonoscopy, ERCP)    Subjective: Anxious to go home denies any nausea vomiting abdominal pain has been having bowel movements  Discharge Exam: Vitals:   05/15/19 2132 05/16/19 0533  BP: (!) 153/86 129/67  Pulse: 85 80  Resp: 18 18  Temp: 98.9 F (37.2 C) 98.7 F (37.1 C)  SpO2: 100% 96%   Vitals:   05/15/19 0519 05/15/19 1343 05/15/19 2132 05/16/19 0533  BP: 105/66 (!) 151/91 (!) 153/86 129/67  Pulse: 76 78 85 80  Resp: 18 18 18 18   Temp: 98.3 F (36.8 C) 98.9 F (37.2 C) 98.9 F (37.2 C) 98.7 F (37.1 C)  TempSrc: Oral Oral Oral Oral  SpO2: 97% 100% 100% 96%  Weight:      Height:        General: Pt is alert, awake, not in acute distress Cardiovascular: RRR, S1/S2 +, no rubs, no gallops Respiratory: CTA bilaterally, no  wheezing, no rhonchi Abdominal: Soft, NT, ND, bowel sounds + Extremities: no edema, no cyanosis    The results of significant diagnostics from this hospitalization (including imaging, microbiology, ancillary and laboratory) are listed below for reference.     Microbiology: Recent Results (from the past 240 hour(s))  SARS CORONAVIRUS 2 (TAT 6-24 HRS) Nasopharyngeal Nasopharyngeal Swab     Status: None   Collection Time: 05/11/19 10:20 PM   Specimen: Nasopharyngeal Swab  Result Value Ref Range Status   SARS Coronavirus 2 NEGATIVE NEGATIVE Final    Comment: (NOTE) SARS-CoV-2 target nucleic acids are NOT DETECTED. The SARS-CoV-2 RNA is generally detectable in upper and lower respiratory specimens during the acute phase of infection. Negative results do not preclude SARS-CoV-2 infection, do not rule out co-infections with other pathogens, and should not be used as the sole basis for treatment or other patient management decisions. Negative results must be combined with clinical observations, patient history, and epidemiological information. The expected result is Negative. Fact Sheet for Patients: SugarRoll.be Fact Sheet for Healthcare Providers: https://www.woods-[employer]ela [employer].com/ This test is not yet approved or cleared by the [place] FDA and  has been authorized for detection and/or diagnosis of SARS-CoV-2 by FDA under an Emergency Use Authorization (EUA). This EUA will remain  in effect (meaning this test can be used) for the duration of the COVID-19 declaration under Section 56  4(b)(1) of the Act, 21 U.S.C. section 360bbb-3(b)(1), unless the authorization is terminated or revoked sooner. Performed at [facility], [place] [address] [place], [place] [address]      Labs: BNP (last 3 results) No results for input(s): BNP in the last 8760 hours. Basic Metabolic Panel: Recent Labs  Lab 05/12/19 0457 05/13/19 0521 05/14/19 0453  05/14/19 0509 05/15/19 0417 05/16/19 0502  NA 138 140 137  --  138 140  K 3.4* 3.3* 3.3*  --  3.6 3.6  CL 106 107 105  --  107 109  CO2 26 22 22   --  21* 22  GLUCOSE 201* 138* 135*  --  119* 140*  BUN 16 16 13   --  13 18  CREATININE 1.63* 1.46* 1.51*  --  1.42* 1.43*  CALCIUM 8.5* 8.3* 8.2*  --  8.4* 8.2*  MG  --   --   --  1.9  --   --    Liver Function Tests: Recent Labs  Lab 05/11/19 2036  AST 37  ALT 52*  ALKPHOS 51  BILITOT 1.2  PROT 7.9  ALBUMIN 3.3*   Recent Labs  Lab 05/11/19 2036  LIPASE 22   No results for input(s): AMMONIA in the last 168 hours. CBC: Recent Labs  Lab 05/11/19 2036 05/12/19 0457 05/13/19 0521 05/14/19 0453 05/15/19 0417  WBC 14.7* 13.8* 9.7 9.2 7.7  NEUTROABS 12.1*  --  7.6 7.0 5.6  HGB 11.2* 10.6* 9.9* 9.8* 9.8*  HCT 33.5* 31.9* 31.5* 29.0* 29.4*  MCV 81.1 81.2 84.2 79.5* 79.5*  PLT 282 263 245 264 305   Cardiac Enzymes: No results for input(s): CKTOTAL, CKMB, CKMBINDEX, TROPONINI in the last 168 hours. BNP: Invalid input(s): POCBNP CBG: Recent Labs  Lab 05/15/19 0730 05/15/19 1142 05/15/19 1647 05/15/19 2129 05/16/19 0730  GLUCAP 117* 131* 115* 156* 125*   D-Dimer No results for input(s): DDIMER in the last 72 hours. Hgb A1c No results for input(s): HGBA1C in the last 72 hours. Lipid Profile No results for input(s): CHOL, HDL, LDLCALC, TRIG, CHOLHDL, LDLDIRECT in the last 72 hours. Thyroid function studies Recent Labs    05/14/19 0453  TSH 1.239   Anemia work up No results for input(s): VITAMINB12, FOLATE, FERRITIN, TIBC, IRON, RETICCTPCT in the last 72 hours. Urinalysis    Component Value Date/Time   COLORURINE YELLOW 05/11/2019 2152   APPEARANCEUR HAZY (A) 05/11/2019 2152   LABSPEC 1.026 05/11/2019 2152   PHURINE 5.0 05/11/2019 2152   GLUCOSEU NEGATIVE 05/11/2019 2152   HGBUR NEGATIVE 05/11/2019 2152   BILIRUBINUR NEGATIVE 05/11/2019 2152   KETONESUR 20 (A) 05/11/2019 2152   PROTEINUR 30 (A) 05/11/2019  2152   UROBILINOGEN 0.2 01/13/2009 1959   NITRITE NEGATIVE 05/11/2019 2152   LEUKOCYTESUR NEGATIVE 05/11/2019 2152   Sepsis Labs Invalid input(s): PROCALCITONIN,  WBC,  LACTICIDVEN Microbiology Recent Results (from the past 240 hour(s))  SARS CORONAVIRUS 2 (TAT 6-24 HRS) Nasopharyngeal Nasopharyngeal Swab     Status: None   Collection Time: 05/11/19 10:20 PM   Specimen: Nasopharyngeal Swab  Result Value Ref Range Status   SARS Coronavirus 2 NEGATIVE NEGATIVE Final    Comment: (NOTE) SARS-CoV-2 target nucleic acids are NOT DETECTED. The SARS-CoV-2 RNA is generally detectable in upper and lower respiratory specimens during the acute phase of infection. Negative results do not preclude SARS-CoV-2 infection, do not rule out co-infections with other pathogens, and should not be used as the sole basis for treatment or other patient management  decisions. Negative results must be combined with clinical observations, patient history, and epidemiological information. The expected result is Negative. Fact Sheet for Patients: SugarRoll.be Fact Sheet for Healthcare Providers: https://www.woods-[employer]annie [employer].com/ This test is not yet approved or cleared by the [place] FDA and  has been authorized for detection and/or diagnosis of SARS-CoV-2 by FDA under an Emergency Use Authorization (EUA). This EUA will remain  in effect (meaning this test can be used) for the duration of the COVID-19 declaration under Section 56 4(b)(1) of the Act, 21 U.S.C. section 360bbb-3(b)(1), unless the authorization is terminated or revoked sooner. Performed at [facility], [place] [address], [place], [place] [address]      Time coordinating discharge:  39 minutes  SIGNED:   Georgette Shell, MD  [facility] 05/16/2019, 10:53 AM  If 7PM-7AM, please contact night-coverage [email or web address] Password [email or web address]

## 2019-05-16 NOTE — Progress Notes (Signed)
Patient ID: Alexander Foster, male   DOB: [birth year], 69 y.o.   MRN: [id number]       Subjective: Patient feeling much better.  Pain has resolved.  Tolerating soft diet.  Dietitian hasn't seen him yet  ROS: See above, otherwise other systems negative  Objective: Vital signs in last 24 hours: Temp:  [98.7 F (37.1 C)-98.9 F (37.2 C)] 98.7 F (37.1 C) (04/21 0533) Pulse Rate:  [78-85] 80 (04/21 0533) Resp:  [18] 18 (04/21 0533) BP: (129-153)/(67-91) 129/67 (04/21 0533) SpO2:  [96 %-100 %] 96 % (04/21 0533) Last BM Date: 05/16/19  Intake/Output from previous day: 04/20 0701 - 04/21 0700 In: 3552.7 [P.O.:1680; I.V.:1240.3; IV Piggyback:632.3] Out: 3200 [Urine:3200] Intake/Output this shift: Total I/O In: 240 [P.O.:240] Out: 350 [Urine:350]  PE: Abd: soft, NT, Nd, +BS  Lab Results:  Recent Labs    05/14/19 0453 05/15/19 0417  WBC 9.2 7.7  HGB 9.8* 9.8*  HCT 29.0* 29.4*  PLT 264 305   BMET Recent Labs    05/15/19 0417 05/16/19 0502  NA 138 140  K 3.6 3.6  CL 107 109  CO2 21* 22  GLUCOSE 119* 140*  BUN 13 18  CREATININE 1.42* 1.43*  CALCIUM 8.4* 8.2*   PT/INR No results for input(s): LABPROT, INR in the last 72 hours. CMP     Component Value Date/Time   NA 140 05/16/2019 0502   K 3.6 05/16/2019 0502   CL 109 05/16/2019 0502   CO2 22 05/16/2019 0502   GLUCOSE 140 (H) 05/16/2019 0502   BUN 18 05/16/2019 0502   CREATININE 1.43 (H) 05/16/2019 0502   CALCIUM 8.2 (L) 05/16/2019 0502   PROT 7.9 05/11/2019 2036   ALBUMIN 3.3 (L) 05/11/2019 2036   AST 37 05/11/2019 2036   ALT 52 (H) 05/11/2019 2036   ALKPHOS 51 05/11/2019 2036   BILITOT 1.2 05/11/2019 2036   GFRNONAA 50 (L) 05/16/2019 0502   GFRAA 58 (L) 05/16/2019 0502   Lipase     Component Value Date/Time   LIPASE 22 05/11/2019 2036       Studies/Results: No results found.  Anti-infectives: Anti-infectives (From admission, onward)   Start     Dose/Rate Route Frequency Ordered Stop   05/16/19 0000  ciprofloxacin (CIPRO) 500 MG tablet     500 mg Oral 2 times daily 05/16/19 1025 05/25/19 2359   05/16/19 0000  metroNIDAZOLE (FLAGYL) 500 MG tablet     500 mg Oral 3 times daily 05/16/19 1025 05/25/19 2359   05/12/19 1000  ciprofloxacin (CIPRO) IVPB 400 mg     400 mg 200 mL/hr over 60 Minutes Intravenous Every 12 hours 05/11/19 2339     05/12/19 0600  metroNIDAZOLE (FLAGYL) IVPB 500 mg     500 mg 100 mL/hr over 60 Minutes Intravenous Every 8 hours 05/12/19 0010     05/11/19 2200  ciprofloxacin (CIPRO) IVPB 400 mg     400 mg 200 mL/hr over 60 Minutes Intravenous  Once 05/11/19 2152 05/11/19 2310   05/11/19 2200  metroNIDAZOLE (FLAGYL) IVPB 500 mg     500 mg 100 mL/hr over 60 Minutes Intravenous  Once 05/11/19 2152 05/11/19 2310       Assessment/Plan HTN DM  Diverticulitis with intramural abscess -pain resolved -tolerating a soft diet -dietitian for diet teaching -stable for DC home today.  Will have him follow up in our office in about 1 month  -total of 14 days of oral abx at home. -discussed with Dr. Rodena Piety  FEN -soft Diet VTE -Lovenox ID -C/F   LOS: 5 days    Henreitta Cea , [facility] 05/16/2019, 10:26 AM Please see Amion for pager number during day hours 7:00am-4:30pm or 7:00am -11:30am on weekends

## 2019-05-16 NOTE — Discharge Instructions (Signed)
Diverticulitis  Diverticulitis is when small pockets in your large intestine (colon) get infected or swollen. This causes stomach pain and watery poop (diarrhea). These pouches are called diverticula. They form in people who have a condition called diverticulosis. Follow these instructions at home: Medicines  Take over-the-counter and prescription medicines only as told by your doctor. These include: ? Antibiotics. ? Pain medicines. ? Fiber pills. ? Probiotics. ? Stool softeners.  Do not drive or use heavy machinery while taking prescription pain medicine.  If you were prescribed an antibiotic, take it as told. Do not stop taking it even if you feel better. General instructions   Follow a diet as told by your doctor.  When you feel better, your doctor may tell you to change your diet. You may need to eat a lot of fiber. Fiber makes it easier to poop (have bowel movements). Healthy foods with fiber include: ? Berries. ? Beans. ? Lentils. ? Green vegetables.  Exercise 3 or more times a week. Aim for 30 minutes each time. Exercise enough to sweat and make your heart beat faster.  Keep all follow-up visits as told. This is important. You may need to have an exam of the large intestine. This is called a colonoscopy. Contact a doctor if:  Your pain does not get better.  You have a hard time eating or drinking.  You are not pooping like normal. Get help right away if:  Your pain gets worse.  Your problems do not get better.  Your problems get worse very fast.  You have a fever.  You throw up (vomit) more than one time.  You have poop that is: ? Bloody. ? Black. ? Tarry. Summary  Diverticulitis is when small pockets in your large intestine (colon) get infected or swollen.  Take medicines only as told by your doctor.  Follow a diet as told by your doctor. This information is not intended to replace advice given to you by your health care provider. Make sure you  discuss any questions you have with your health care provider. Document Revised: 12/24/2016 Document Reviewed: 01/29/2016 Elsevier Patient Education  [place], 4-6 weeks Fiber is found in fruits, vegetables, whole grains, and beans. Eating a diet low in fiber helps to reduce how often you have bowel movements and how much you produce during a bowel movement. A low-fiber eating plan may help your digestive system heal if:  You have certain conditions, such as Crohn's disease or diverticulitis.  You recently had radiation therapy on your pelvis or bowel.  You recently had intestinal surgery.  You have a new surgical opening in your abdomen (colostomy or ileostomy).  Your intestine is narrowed (stricture). Your health care provider will determine how long you need to stay on this diet. Your health care provider may recommend that you work with a diet and nutrition specialist (dietitian). What are tips for following this plan? General guidelines  Follow recommendations from your dietitian about how much fiber you should have each day.  Most people on this eating plan should try to eat less than 10 grams (g) of fiber each day. Your daily fiber goal is _________________ g.  Take vitamin and mineral supplements as told by your health care provider or dietitian. Chewable or liquid forms are best when on this eating plan. Reading food labels  Check food labels for the amount of dietary fiber.  Choose foods that have less than 2 grams of fiber in  one serving. Cooking  Use white flour and other allowed grains for baking and cooking.  Cook meat using methods that keep it tender, such as braising or poaching.  Cook eggs until the yolk is completely solid.  Cook with healthy oils, such as olive oil or canola oil. Meal planning   Eat 5-6 small meals throughout the day instead of 3 large meals.  If you are lactose intolerant: ? Choose low-lactose dairy  foods. ? Do not eat dairy foods, if told by your dietitian.  Limit fat and oils to less than 8 teaspoons a day.  Eat small portions of desserts. What foods are allowed? The items listed below may not be a complete list. Talk with your dietitian about what dietary choices are best for you. Grains All bread and crackers made with white flour. Waffles, pancakes, and [place] toast. Bagels. Pretzels. Melba toast, zwieback, and matzoh. Cooked and dried cereals that do not contain whole grains, added fiber, seeds, or dried fruit. CornmealDomenick Gong. Hot and cold cereals made with refined corn, wheat, rice, or oats. Plain pasta and noodles. White rice. Vegetables Well-cooked or canned vegetables without skin, seeds, or stems. Cooked potatoes without skins. Vegetable juice. Fruits Soft-cooked or canned fruits without skin and seeds. Peeled ripe banana. Applesauce. Fruit juice without pulp. Meats and other protein foods Ground meat. Tender cuts of meat or poultry. Eggs. Fish, seafood, and shellfish. Smooth nut butters. Tofu. Dairy All milk products and drinks. Lactose-free milks, including rice, soy, and almond milks. Yogurt without fruit, nuts, chocolate, or granola mix-ins. Sour cream. Cottage cheese. Cheese. Beverages Decaf coffee. Fruit and vegetable juices or smoothies (in small amounts, with no pulp or skins, and with fruits from allowed list). Sports drinks. Herbal tea. Fats and oils Olive oil, canola oil, sunflower oil, flaxseed oil, and grapeseed oil. Mayonnaise. Cream cheese. Margarine. Butter. Sweets and desserts Plain cakes and cookies. Cream pies and pies made with allowed fruits. Pudding. Custard. Fruit gelatin. Sherbet. Popsicles. Ice cream without nuts. Plain hard candy. Honey. Jelly. Molasses. Syrups, including chocolate syrup. Chocolate. Marshmallows. Gumdrops. Seasoning and other foods Bouillon. Broth. Cream soups made from allowed foods. Strained soup. Casseroles made with allowed  foods. Ketchup. Mild mustard. Mild salad dressings. Plain gravies. Vinegar. Spices in moderation. Salt. Sugar. What foods are not allowed? The items listed below may not be a complete list. Talk with your dietitian about what dietary choices are best for you. Grains Whole wheat and whole grain breads and crackers. Multigrain breads and crackers. Rye bread. Whole grain or multigrain cereals. Cereals with nuts, raisins, or coconut. Bran. Coarse wheat cereals. Granola. High-fiber cereals. Cornmeal or corn bread. Whole grain pasta. Wild or brown rice. Quinoa. Popcorn. Buckwheat. Wheat germ. Vegetables Potato skins. Raw or undercooked vegetables. All beans and bean sprouts. Cooked greens. Corn. Peas. Cabbage. Beets. Broccoli. Brussels sprouts. Cauliflower. Mushrooms. Onions. Peppers. Parsnips. Okra. Sauerkraut. Fruit Raw or dried fruit. Berries. Fruit juice with pulp. Prune juice. Meats and other protein foods Tough, fibrous meats with gristle. Fatty meat. Poultry with skin. Fried meat, Sales executive, or fish. Deli or lunch meats. Sausage, bacon, and hot dogs. Nuts and chunky nut butter. Dried peas, beans, and lentils. Dairy Yogurt with fruit, nuts, chocolate, or granola mix-ins. Beverages Caffeinated coffee and teas. Fats and oils Avocado. Coconut. Sweets and desserts Desserts, cookies, or candies that contain nuts or coconut. Dried fruit. Jams and preserves with seeds. Marmalade. Any dessert made with fruits or grains that are not allowed. Seasoning and other foods Corn  tortilla chips. Soups made with vegetables or grains that are not allowed. Relish. Horseradish. Alexander Foster. Olives. Summary  Most people on a low-fiber eating plan should eat less than 10 grams of fiber a day. Follow recommendations from your dietitian about how much fiber you should have each day.  Always check food labels to see the dietary fiber content of packaged foods. In general, a low-fiber food will have fewer than 2 grams of  fiber per serving.  In general, try to avoid whole grains, raw fruits and vegetables, dried fruit, tough cuts of meat, nuts, and seeds.  Take a vitamin and mineral supplement as told by your health care provider or dietitian. This information is not intended to replace advice given to you by your health care provider. Make sure you discuss any questions you have with your health care provider. Document Revised: 05/05/2018 Document Reviewed: 03/16/2016 Elsevier Patient Education  [place].   High-Fiber Diet Fiber, also called dietary fiber, is a type of carbohydrate that is found in fruits, vegetables, whole grains, and beans. A high-fiber diet can have many health benefits. Your health care provider may recommend a high-fiber diet to help:  Prevent constipation. Fiber can make your bowel movements more regular.  Lower your cholesterol.  Relieve the following conditions: ? Swelling of veins in the anus (hemorrhoids). ? Swelling and irritation (inflammation) of specific areas of the digestive tract (uncomplicated diverticulosis). ? A problem of the large intestine (colon) that sometimes causes pain and diarrhea (irritable bowel syndrome, IBS).  Prevent overeating as part of a weight-loss plan.  Prevent heart disease, type 2 diabetes, and certain cancers. What is my plan? The recommended daily fiber intake in grams (g) includes:  38 g for men age 55 or younger.  30 g for men over age 88.  81 g for women age 45 or younger.  21 g for women over age 59. You can get the recommended daily intake of dietary fiber by:  Eating a variety of fruits, vegetables, grains, and beans.  Taking a fiber supplement, if it is not possible to get enough fiber through your diet. What do I need to know about a high-fiber diet?  It is better to get fiber through food sources rather than from fiber supplements. There is not a lot of research about how effective supplements are.  Always  check the fiber content on the nutrition facts label of any prepackaged food. Look for foods that contain 5 g of fiber or more per serving.  Talk with a diet and nutrition specialist (dietitian) if you have questions about specific foods that are recommended or not recommended for your medical condition, especially if those foods are not listed below.  Gradually increase how much fiber you consume. If you increase your intake of dietary fiber too quickly, you may have bloating, cramping, or gas.  Drink plenty of water. Water helps you to digest fiber. What are tips for following this plan?  Eat a wide variety of high-fiber foods.  Make sure that half of the grains that you eat each day are whole grains.  Eat breads and cereals that are made with whole-grain flour instead of refined flour or white flour.  Eat brown rice, bulgur wheat, or millet instead of white rice.  Start the day with a breakfast that is high in fiber, such as a cereal that contains 5 g of fiber or more per serving.  Use beans in place of meat in soups, salads, and pasta  dishes.  Eat high-fiber snacks, such as berries, raw vegetables, nuts, and popcorn.  Choose whole fruits and vegetables instead of processed forms like juice or sauce. What foods can I eat?  Fruits Berries. Pears. Apples. Oranges. Avocado. Prunes and raisins. Dried figs. Vegetables Sweet potatoes. Spinach. Kale. Artichokes. Cabbage. Broccoli. Cauliflower. Green peas. Carrots. Squash. Grains Whole-grain breads. Multigrain cereal. Oats and oatmeal. Brown rice. Barley. Bulgur wheat. [place]. Quinoa. Bran muffins. Popcorn. Rye wafer crackers. Meats and other proteins Navy, kidney, and pinto beans. Soybeans. Split peas. Lentils. Nuts and seeds. Dairy Fiber-fortified yogurt. Beverages Fiber-fortified soy milk. Fiber-fortified orange juice. Other foods Fiber bars. The items listed above may not be a complete list of recommended foods and beverages.  Contact a dietitian for more options. What foods are not recommended? Fruits Fruit juice. Cooked, strained fruit. Vegetables Fried potatoes. Canned vegetables. Well-cooked vegetables. Grains White bread. Pasta made with refined flour. White rice. Meats and other proteins Fatty cuts of meat. Fried chicken or fried fish. Dairy Milk. Yogurt. Cream cheese. Sour cream. Fats and oils Butters. Beverages Soft drinks. Other foods Cakes and pastries. The items listed above may not be a complete list of foods and beverages to avoid. Contact a dietitian for more information. Summary  Fiber is a type of carbohydrate. It is found in fruits, vegetables, whole grains, and beans.  There are many health benefits of eating a high-fiber diet, such as preventing constipation, lowering blood cholesterol, helping with weight loss, and reducing your risk of heart disease, diabetes, and certain cancers.  Gradually increase your intake of fiber. Increasing too fast can result in cramping, bloating, and gas. Drink plenty of water while you increase your fiber.  The best sources of fiber include whole fruits and vegetables, whole grains, nuts, seeds, and beans. This information is not intended to replace advice given to you by your health care provider. Make sure you discuss any questions you have with your health care provider. Document Revised: 11/15/2016 Document Reviewed: 11/15/2016 Elsevier Patient Education  [place].    Low-Fiber (8 grams) Nutrition Therapy You may need a low-fiber diet if you have Crohn's disease, diverticulitis, gastroparesis, ulcerative colitis, a new colostomy, or new ileostomy.  A low-fiber diet may also be needed following radiation therapy to the pelvis and lower bowel or recent intestinal surgery. A low-fiber diet reduces the frequency and volume of your stools.  This lessens irritation to the gastrointestinal (GI) tract and can help you heal.  Use this diet if  you have a stricture so your intestine doesn't get blocked.  The goal of this diet is to get less than 8 grams of fiber daily.  It's also important to eat enough protein foods while you are on a low-fiber diet. Drink nutrition supplements that have 1 gram of fiber or less in each serving.  If your stricture is severe or if your inflammation is severe, drink more liquids to reduce symptoms and to get enough calories and protein.  Tips Eat about 5 to 6 small meals daily or about every 3 to 4 hours.  Do not skip meals. Every time you eat, include a small amount of protein (1 to 2 ounces) plus an additional food.  Low fiber starch foods are the best choice to eat with protein. Limit acidic, spicy and high-fat or fried and greasy foods to reduce GI symptoms. Do not eat raw fruits and vegetables while on this diet.  All fruits and vegetables need to be cooked and without peels  or skins. Drink a lot of fluids, at least 8 cups of fluid each day.  Limit drinks with caffeine, sugar, and sugar substitutes. Plain water is the best choice.  Avoid mixing drink packets or flavor drops into water. Take a chewable multivitamin with minerals.  Gummy vitamins do not have enough minerals and can block an ostomy and non-chewable supplements are not easily digested.  Chewable supplements must be used if you have a stricture or ostomy. If you are lactose intolerant, you may need to eat low-lactose dairy products.  If you can't tolerate dairy, ask your RDN about how you can get enough calcium from other foods. Do not take a calcium supplement. They can cause a blockage. It is important to add high-calcium foods gradually to your diet and monitor for symptoms to avoid a blockage. Fiber is part of whole grains, fruits and vegetables (foods from plants) and needs to be slowly added back into your diet when your body is healed. Choose foods that have been safely handled and prepared to lower your risk of foodborne  illness.   Foods Recommended These foods are low in fat and fiber and will help with your GI symptoms. Grains  Choose grain foods with less than 2 grams of fiber per serving. Refined white flour products--for example, enriched white bread without seeds, crackers or pasta Cream of wheat or rice Grits (fine ground) Tortillas: white flour or corn White rice, well-cooked (do not rinse, or soak before cooking) Cold and hot cereals made from white or refined flour such as crispy rice or corn flakes Protein Lean, very tender, well-cooked poultry or fish; red meats: beef, pork or lamb (slow cook until soft; chop meats if you have stricture or ostomy) Eggs, well-cooked Smooth nut butters such as almond, peanut, or sunflower Tofu Dairy  If you have lactose intolerance, drinking milk products from cows or goats may make diarrhea worse.  Foods marked with an asterisk (*) have lactose. Milk: fat-free, 1% or 2% * (choose best tolerated) Lactose-free milk Buttermilk* Fortified non-dairy milks: almond, cashew, coconut, or rice (be aware that these options are not good sources of protein so you will need to eat an additional protein food) Kefir* (Don't include kefir in the diet until approved by your health care provider) Yogurt*/lactose-free yogurt (without nuts, fruit, granola or chocolate) Mild cheese* (hard and aged cheeses tend to be lower in lactose such as cheddar, swiss or parmesan) Cottage cheese* or lactose-free cottage cheese Low-fat ice cream* or lactose-free ice cream Sherbet* (usually lower lactose) Vegetables  Canned and well-cooked vegetables without seeds, skins, or hulls Carrots or green beans, cooked White, red or yellow potatoes without skins Strained vegetable juice Fruit  Soft, and well-cooked fruits without skins, seeds, or membranes Canned fruit in juice: peaches, pears, or applesauce Fruit juice without pulp diluted by half with water may be tolerated better Fruit  drinks fortified with vitamin C may be tolerated better than 100% fruit juice Oils  When possible, choose healthy oils and fats, such as olive and canola oils, plant oils rather than solid fats. Other  Broth and strained soups made from allowed foods Desserts (small portions) without whole grains, seeds, nuts, raisins, or coconut Jelly (clear)  Foods Not Recommended These foods are higher in fat and fiber and may make your GI symptoms worse. Grains  Bread, whole wheat or with whole grain flour or seeds or nuts Brown rice, quinoa, kasha, barley Tortillas: whole grain Whole wheat pasta Whole grain and high-fiber cereals,  including oatmeal, bran flakes or shredded wheat Popcorn Protein Steak, pork chops, or other meats that are fatty or have gristle Fried meat, poultry, or fish Seafood with a tough or rubbery texture, such as shrimp Luncheon meats such as bologna and salami Sausage, bacon, or hot dogs Dried beans, peas, or lentils Hummus Sushi Nuts and chunky nut butters Dairy  Whole milk Pea milk and soymilk (may cause diarrhea, gas, bloating, and abdominal pain) Cream Half-and-half Sour cream Yogurt with added fruit, nuts, or granola or chocolate Vegetables  Alfalfa or bean sprouts (high fiber and risk for bacteria) Raw or undercooked vegetables: beets; broccoli; brussels sprouts; cabbage; cauliflower; collard, mustard, or turnip greens; corn; cucumber; green peas or any kind of peas; kale; lima beans; mushrooms; okra; olives; pickles and relish; onions; parsnips; peppers; potato skins; sauerkraut; spinach; tomatoes Fruit  Raw fruit Dried fruit Avocado, berries, coconut Canned fruit in syrup Canned fruit with mandarin oranges, papaya or pineapple Fruit juice with pulp Prune juice Fruit skin Oils  Pork rinds

## 2019-05-16 NOTE — Progress Notes (Signed)
Dietician came up to speak with patient before he left.  He was tolerating his diet, no complaints. D/C instructions given by Marney Doctor, LPN (student).

## 2019-05-16 NOTE — Progress Notes (Addendum)
NUTRITION NOTE  Consult received for diet education for 4-6 weeks of a low fiber diet for patient with dx of diverticulitis with abscess and small bowel ileus.   He was admitted on 4/17 and discharge order and discharge summary for discharge home was entered late morning.   Able to talk with patient at bedside. He reports having an episode similar to this in the past and recalls that at that time he was on a low fiber diet and then transitioned to a higher fiber diet a few weeks later.  Provided patient with Low Fiber Diet Nutrition Therapy handout from the [employer] and reviewed this and rationale for it with patient. Also let him know that handout would be entered in Discharge Instructions in case he lost hard copy prior to arriving home.   All questions answered.      Jarome Matin, MS, RD, LDN, CNSC Inpatient Clinical Dietitian RD pager # available in [place]  After hours/weekend pager # available in [facility]

## 2019-09-06 ENCOUNTER — Other Ambulatory Visit: Payer: Self-pay | Admitting: Family Medicine

## 2019-09-06 ENCOUNTER — Ambulatory Visit
Admission: RE | Admit: 2019-09-06 | Discharge: 2019-09-06 | Disposition: A | Discharge status: Home / Self Care | Payer: BC Managed Care – PPO | Source: Ambulatory Visit | Attending: Family Medicine | Admitting: Family Medicine

## 2019-09-06 DIAGNOSIS — M62838 Other muscle spasm: Secondary | ICD-10-CM

## 2019-09-06 DIAGNOSIS — M130 Polyarthritis, unspecified: Secondary | ICD-10-CM

## 2019-11-05 ENCOUNTER — Ambulatory Visit: Payer: Self-pay | Admitting: General Surgery

## 2019-11-05 NOTE — H&P (Signed)
The patient is a 69 year old male who presents with diverticulitis. 69 year old male who presented to the office for evaluation of diverticulitis following recent hospitalization in April 2021. He was noted to have severe inflammation of his mid sigmoid colon. There was concern for intramural abscess, but no extramural findings. Patient's symptoms have much resolved. He has occasional left lower quadrant twinges but no pain and he is having bowel function without difficulty. He is on a low fiber diet. He recently underwent a colonoscopy which was normal except for diverticular stricture. He is also being evaluated by urology for frequent UTIs. There is concern on CT scan for possible colovesicular fistula. He is scheduled to undergo CT cystography later this month. He reports occasional bloody urine. He has not had air in his urine for quite some time. He denies any pelvic pain.   Problem List/Past Medical Leighton Ruff, MD; 18/84/1660 11:51 AM) DIVERTICULITIS [phone])  Past Surgical History Leighton Ruff, MD; 02/02/3233 11:51 AM) Colon Polyp Removal - Colonoscopy Tonsillectomy  Diagnostic Studies History Leighton Ruff, MD; 57/32/2025 11:51 AM) Colonoscopy 1-5 years ago  Allergies (Tanisha A. Owens Shark, [place]; 11/05/2019 11:27 AM) No Known Drug Allergies [06/12/2019]: Allergies Reconciled  Medication History Leighton Ruff, MD; 42/70/6237 11:51 AM) Aspirin (81MG  Tablet, Oral) Active. metFORMIN HCl (1000MG  Tablet, Oral) Active. Niacin (500MG  Tablet, Oral) Active. Flomax (0.4MG  Capsule, Oral) Active. Olmesartan Medoxomil-HCTZ (20-12.5MG  Tablet, Oral) Active. Olmesartan-amLODIPine-HCTZ (20-5-12.5MG  Tablet, Oral) Active. Singulair (10MG  Tablet, Oral) Active. Medications Reconciled Flagyl (500MG  Tablet, 2 (two) Oral SEE NOTE, Taken starting 11/05/2019) Active. (Take at 2pm, 3pm, and 10pm the day prior to your colon operation) Neomycin Sulfate (500MG  Tablet, 2 (two)  Oral SEE NOTE, Taken starting 11/05/2019) Active. (TAKE TWO TABLETS AT 2 PM, 3 PM, AND 10 PM THE DAY PRIOR TO SURGERY)  Social History Leighton Ruff, MD; 62/83/1517 11:51 AM) Alcohol use Occasional alcohol use. Caffeine use Carbonated beverages, Coffee. Illicit drug use Remotely quit drug use. Tobacco use Never smoker.  Family History Leighton Ruff, MD; 61/60/7371 11:51 AM) Heart Disease Brother, Mother. Heart disease in male family member before age 53 Heart disease in male family member before age 17  Other Problems Leighton Ruff, MD; 07/21/9483 11:51 AM) Diabetes Mellitus Diverticulosis     Review of Systems Leighton Ruff MD; 46/27/0350 11:51 AM) General Not Present- Appetite Loss, Chills, Fatigue, Fever, Night Sweats, Weight Gain and Weight Loss. Skin Not Present- Change in Wart/Mole, Dryness, Hives, Jaundice, New Lesions, Non-Healing Wounds, Rash and Ulcer. HEENT Present- Wears glasses/contact lenses. Not Present- Earache, Hearing Loss, Hoarseness, Nose Bleed, Oral Ulcers, Ringing in the Ears, Seasonal Allergies, Sinus Pain, Sore Throat, Visual Disturbances and Yellow Eyes. Respiratory Not Present- Bloody sputum, Chronic Cough, Difficulty Breathing, Snoring and Wheezing. Breast Not Present- Breast Mass, Breast Pain, Nipple Discharge and Skin Changes. Cardiovascular Not Present- Chest Pain, Difficulty Breathing Lying Down, Leg Cramps, Palpitations, Rapid Heart Rate, Shortness of Breath and Swelling of Extremities. Gastrointestinal Not Present- Abdominal Pain, Bloating, Bloody Stool, Change in Bowel Habits, Chronic diarrhea, Constipation, Difficulty Swallowing, Excessive gas, Gets full quickly at meals, Hemorrhoids, Indigestion, Nausea, Rectal Pain and Vomiting. Male Genitourinary Not Present- Blood in Urine, Change in Urinary Stream, Frequency, Impotence, Nocturia, Painful Urination, Urgency and Urine Leakage. Musculoskeletal Not Present- Back Pain, Joint Pain, Joint  Stiffness, Muscle Pain, Muscle Weakness and Swelling of Extremities. Neurological Not Present- Decreased Memory, Fainting, Headaches, Numbness, Seizures, Tingling, Tremor, Trouble walking and Weakness. Psychiatric Not Present- Anxiety, Bipolar, Change in Sleep Pattern, Depression, Fearful and Frequent crying. Endocrine  Not Present- Cold Intolerance, Excessive Hunger, Hair Changes, Heat Intolerance and New Diabetes. Hematology Not Present- Blood Thinners, Easy Bruising, Excessive bleeding, Gland problems, HIV and Persistent Infections.  Vitals (Tanisha A. Brown RMA; 11/05/2019 11:27 AM) 11/05/2019 11:27 AM Weight: 202.2 lb Height: 71in Body Surface Area: 2.12 m Body Mass Index: 28.2 kg/m  Temp.: 53F  Pulse: 114 (Regular)  BP: 118/76(Sitting, Left Arm, Standard)   Physical Exam Leighton Ruff MD; 11/18/8525 11:51 AM)  General Mental Status-Alert. General Appearance-Cooperative.  Abdomen Palpation/Percussion Palpation and Percussion of the abdomen reveal - Soft and Non Tender.    Assessment & Plan Leighton Ruff MD; 78/24/2353 11:48 AM)  DIVERTICULITIS (I14.43) Impression: 69 year old male who presents to the office for evaluation of diverticular disease with stricture and possible colovesicular fistula. I have recommended robotic-assisted surgical resection of the sigmoid colon and possible repair of bladder fistula. He is scheduled for a CT cystogram later this month.  ssed this in detail. I would like to have urology inject firefly prior to the surgery.  We will obtain the records for this and plan her surgery accordingly. We have discussed his surgery in detail, including risks, benefits and alternatives.   All questions were answered. The surgery and anatomy were described to the patient as well as the risks of surgery and the possible complications. These include: Bleeding, deep abdominal infections and possible wound complications such as hernia and infection,  damage to adjacent structures, leak of surgical connections, which can lead to other surgeries and possibly an ostomy, possible need for other procedures, such as abscess drains in radiology, possible prolonged hospital stay, possible diarrhea from removal of part of the colon, possible constipation from narcotics, possible bowel, bladder or sexual dysfunction if having rectal surgery, prolonged fatigue/weakness or appetite loss, possible early recurrence of of disease, possible complications of their medical problems such as heart disease or arrhythmias or lung problems, death (less than 1%). I believe the patient understands and wishes to proceed with the surgery.  Current Plans

## 2019-11-13 ENCOUNTER — Other Ambulatory Visit: Payer: Self-pay | Admitting: Urology

## 2019-12-26 NOTE — Patient Instructions (Addendum)
DUE TO COVID-19 ONLY ONE VISITOR IS ALLOWED TO COME WITH YOU AND STAY IN THE WAITING ROOM ONLY DURING PRE OP AND PROCEDURE.   IF YOU WILL BE ADMITTED INTO THE HOSPITAL YOU ARE ALLOWED ONE SUPPORT PERSON DURING VISITATION HOURS ONLY (10AM -8PM)   . The support person may change daily. . The support person must pass our screening, gel in and out, and wear a mask at all times, including in the patient's room. . Patients must also wear a mask when staff or their support person are in the room.   COVID SWAB TESTING MUST BE COMPLETED ON:  Saturday, 12-29-19 @ 10:30 AM   [address] [place], [place] [address]  (Must self quarantine after testing. Follow instructions on handout.)        Your procedure is scheduled on:  Wednesday, 01-02-20   Report to [facility] Main  Entrance   Report to admitting at 6:30 AM   Call this number if you have problems the morning of surgery [phone]    Follow a clear liquid diet day of prep to prevent dehydration.  Follow prep instructions per surgeon's office   Do not eat food :After Midnight.   May have liquids until 5:30 AM day of surgery  CLEAR LIQUID DIET  Foods Allowed                                                                     Foods Excluded  Water, Black Coffee and tea, regular and decaf              liquids that you cannot  Plain Jell-O in any flavor  (No red)                                     see through such as: Fruit ices (not with fruit pulp)                                      milk, soups, orange juice              Iced Popsicles (No red)                                      All solid food                                   Apple juices Sports drinks like Gatorade (No red) Lightly seasoned clear broth or consume(fat free) Sugar, honey syrup  Sample Menu Breakfast                                Lunch                                     Supper Cranberry juice  Beef broth                             Chicken broth Jell-O                                     Grape juice                           Apple juice Coffee or tea                        Jell-O                                      Popsicle                                                  Coffee or tea                        Coffee or tea      Drink 2 G2 drinks the night before surgery y 10 PM.    Complete one G2 drink the morning of surgery 3 hours prior to scheduled surgery at 5:30 AM.   Oral Hygiene is also important to reduce your risk of infection.                                    Remember - BRUSH YOUR TEETH THE MORNING OF SURGERY WITH YOUR REGULAR TOOTHPASTE   Do NOT smoke after Midnight   Take these medicines the morning of surgery with A SIP OF WATER:  None   How to Manage Your Diabetes Before and After Surgery  Why is it important to control my blood sugar before and after surgery? . Improving blood sugar levels before and after surgery helps healing and can limit problems. . A way of improving blood sugar control is eating a healthy diet by: o  Eating less sugar and carbohydrates o  Increasing activity/exercise o  Talking with your doctor about reaching your blood sugar goals . High blood sugars (greater than 180 mg/dL) can raise your risk of infections and slow your recovery, so you will need to focus on controlling your diabetes during the weeks before surgery. . Make sure that the doctor who takes care of your diabetes knows about your planned surgery including the date and location.  How do I manage my blood sugar before surgery? . Check your blood sugar at least 4 times a day, starting 2 days before surgery, to make sure that the level is not too high or low. o Check your blood sugar the morning of your surgery when you wake up and every 2 hours until you get to the Short Stay unit. . If your blood sugar is less than 70 mg/dL, you will need to treat for low blood sugar: o Do not take insulin. o Treat a low  blood sugar (less than 70 mg/dL) with  cup of clear juice (cranberry  or apple), 4 glucose tablets, OR glucose gel. o Recheck blood sugar in 15 minutes after treatment (to make sure it is greater than 70 mg/dL). If your blood sugar is not greater than 70 mg/dL on recheck, call [phone] for further instructions. . Report your blood sugar to the short stay nurse when you get to Short Stay.  . If you are admitted to the hospital after surgery: o Your blood sugar will be checked by the staff and you will probably be given insulin after surgery (instead of oral diabetes medicines) to make sure you have good blood sugar levels. o The goal for blood sugar control after surgery is 80-180 mg/dL.   WHAT DO I DO ABOUT MY DIABETES MEDICATION?  Marland Kitchen Do not take oral diabetes medicines (pills) the morning of surgery.  . THE DAY BEFORE SURGERY:   Take Metformin as prescribed.                     Take Victoza as prescribed.                    Take 40 units of Tresiba       . THE MORNING OF SURGERY:  Take 20 units of Tresiba       Do not take Metformin or Victoza  . The day of surgery, do not take other diabetes injectables, including Byetta (exenatide), Bydureon (exenatide ER), Victoza (liraglutide), or Trulicity (dulaglutide).     Reviewed and Endorsed by [facility] Patient Education Committee, August 2015                               You may not have any metal on your body including jewelry, and body piercings             Do not wear  lotions, powders, perfumes/cologne, or deodorant             Men may shave face and neck.   Do not bring valuables to the hospital. [place].   Contacts, dentures or bridgework may not be worn into surgery.   Bring small overnight bag day of surgery.      Special Instructions: Bring a copy of your healthcare power of attorney and living will documents         the day of surgery if you haven't scanned them in before.               Please read over the following fact sheets you were given: IF YOU HAVE QUESTIONS ABOUT YOUR PRE OP INSTRUCTIONS PLEASE CALL [phone]   [place] - Preparing for Surgery Before surgery, you can play an important role.  Because skin is not sterile, your skin needs to be as free of germs as possible.  You can reduce the number of germs on your skin by washing with CHG (chlorahexidine gluconate) soap before surgery.  CHG is an antiseptic cleaner which kills germs and bonds with the skin to continue killing germs even after washing. Please DO NOT use if you have an allergy to CHG or antibacterial soaps.  If your skin becomes reddened/irritated stop using the CHG and inform your nurse when you arrive at Short Stay. Do not shave (including legs and underarms) for at least 48 hours prior to the first CHG shower.  You may shave your face/neck.  Please follow these instructions carefully:  1.  Shower with CHG Soap the night before surgery and the  morning of surgery.  2.  If you choose to wash your hair, wash your hair first as usual with your normal  shampoo.  3.  After you shampoo, rinse your hair and body thoroughly to remove the shampoo.                             4.  Use CHG as you would any other liquid soap.  You can apply chg directly to the skin and wash.  Gently with a scrungie or clean washcloth.  5.  Apply the CHG Soap to your body ONLY FROM THE NECK DOWN.   Do   not use on face/ open                           Wound or open sores. Avoid contact with eyes, ears mouth and   genitals (private parts).                       Wash face,  Genitals (private parts) with your normal soap.             6.  Wash thoroughly, paying special attention to the area where your    surgery  will be performed.  7.  Thoroughly rinse your body with warm water from the neck down.  8.  DO NOT shower/wash with your normal soap after using and rinsing off the CHG Soap.                9.  Pat yourself dry with  a clean towel.            10.  Wear clean pajamas.            11.  Place clean sheets on your bed the night of your first shower and do not  sleep with pets. Day of Surgery : Do not apply any lotions/deodorants the morning of surgery.  Please wear clean clothes to the hospital/surgery center.  FAILURE TO FOLLOW THESE INSTRUCTIONS MAY RESULT IN THE CANCELLATION OF YOUR SURGERY  PATIENT SIGNATURE_________________________________  NURSE SIGNATURE__________________________________  ________________________________________________________________________   Adam Phenix  An incentive spirometer is a tool that can help keep your lungs clear and active. This tool measures how well you are filling your lungs with each breath. Taking long deep breaths may help reverse or decrease the chance of developing breathing (pulmonary) problems (especially infection) following:  A long period of time when you are unable to move or be active. BEFORE THE PROCEDURE   If the spirometer includes an indicator to show your best effort, your nurse or respiratory therapist will set it to a desired goal.  If possible, sit up straight or lean slightly forward. Try not to slouch.  Hold the incentive spirometer in an upright position. INSTRUCTIONS FOR USE  1. Sit on the edge of your bed if possible, or sit up as far as you can in bed or on a chair. 2. Hold the incentive spirometer in an upright position. 3. Breathe out normally. 4. Place the mouthpiece in your mouth and seal your lips tightly around it. 5. Breathe in slowly and as deeply as possible, raising the piston or the ball toward the top of the column. 6. Hold your breath for 3-5 seconds or for as long as possible. Allow the  piston or ball to fall to the bottom of the column. 7. Remove the mouthpiece from your mouth and breathe out normally. 8. Rest for a few seconds and repeat Steps 1 through 7 at least 10 times every 1-2 hours when you are awake.  Take your time and take a few normal breaths between deep breaths. 9. The spirometer may include an indicator to show your best effort. Use the indicator as a goal to work toward during each repetition. 10. After each set of 10 deep breaths, practice coughing to be sure your lungs are clear. If you have an incision (the cut made at the time of surgery), support your incision when coughing by placing a pillow or rolled up towels firmly against it. Once you are able to get out of bed, walk around indoors and cough well. You may stop using the incentive spirometer when instructed by your caregiver.  RISKS AND COMPLICATIONS  Take your time so you do not get dizzy or light-headed.  If you are in pain, you may need to take or ask for pain medication before doing incentive spirometry. It is harder to take a deep breath if you are having pain. AFTER USE  Rest and breathe slowly and easily.  It can be helpful to keep track of a log of your progress. Your caregiver can provide you with a simple table to help with this. If you are using the spirometer at home, follow these instructions: [place] IF:   You are having difficultly using the spirometer.  You have trouble using the spirometer as often as instructed.  Your pain medication is not giving enough relief while using the spirometer.  You develop fever of 100.5 F (38.1 C) or higher. SEEK IMMEDIATE MEDICAL CARE IF:   You cough up bloody sputum that had not been present before.  You develop fever of 102 F (38.9 C) or greater.  You develop worsening pain at or near the incision site. MAKE SURE YOU:   Understand these instructions.  Will watch your condition.  Will get help right away if you are not doing well or get worse. Document Released: 05/24/2006 Document Revised: 04/05/2011 Document Reviewed: 07/25/2006 Exit[employer] Patient Information 2014 ExitCare,  [place].   ________________________________________________________________________  WHAT IS A BLOOD TRANSFUSION? Blood Transfusion Information  A transfusion is the replacement of blood or some of its parts. Blood is made up of multiple cells which provide different functions.  Red blood cells carry oxygen and are used for blood loss replacement.  White blood cells fight against infection.  Platelets control bleeding.  Plasma helps clot blood.  Other blood products are available for specialized needs, such as hemophilia or other clotting disorders. BEFORE THE TRANSFUSION  Who gives blood for transfusions?   Healthy volunteers who are fully evaluated to make sure their blood is safe. This is blood bank blood. Transfusion therapy is the safest it has ever been in the practice of medicine. Before blood is taken from a donor, a complete history is taken to make sure that person has no history of diseases nor engages in risky social behavior (examples are intravenous drug use or sexual activity with multiple partners). The donor's travel history is screened to minimize risk of transmitting infections, such as malaria. The donated blood is tested for signs of infectious diseases, such as HIV and hepatitis. The blood is then tested to be sure it is compatible with you in order to minimize the chance of a transfusion reaction. If  you or a relative donates blood, this is often done in anticipation of surgery and is not appropriate for emergency situations. It takes many days to process the donated blood. RISKS AND COMPLICATIONS Although transfusion therapy is very safe and saves many lives, the main dangers of transfusion include:   Getting an infectious disease.  Developing a transfusion reaction. This is an allergic reaction to something in the blood you were given. Every precaution is taken to prevent this. The decision to have a blood transfusion has been considered carefully by your caregiver  before blood is given. Blood is not given unless the benefits outweigh the risks. AFTER THE TRANSFUSION  Right after receiving a blood transfusion, you will usually feel much better and more energetic. This is especially true if your red blood cells have gotten low (anemic). The transfusion raises the level of the red blood cells which carry oxygen, and this usually causes an energy increase.  The nurse administering the transfusion will monitor you carefully for complications. HOME CARE INSTRUCTIONS  No special instructions are needed after a transfusion. You may find your energy is better. Speak with your caregiver about any limitations on activity for underlying diseases you may have. SEEK MEDICAL CARE IF:   Your condition is not improving after your transfusion.  You develop redness or irritation at the intravenous (IV) site. SEEK IMMEDIATE MEDICAL CARE IF:  Any of the following symptoms occur over the next 12 hours:  Shaking chills.  You have a temperature by mouth above 102 F (38.9 C), not controlled by medicine.  Chest, back, or muscle pain.  People around you feel you are not acting correctly or are confused.  Shortness of breath or difficulty breathing.  Dizziness and fainting.  You get a rash or develop hives.  You have a decrease in urine output.  Your urine turns a dark color or changes to pink, red, or brown. Any of the following symptoms occur over the next 10 days:  You have a temperature by mouth above 102 F (38.9 C), not controlled by medicine.  Shortness of breath.  Weakness after normal activity.  The white part of the eye turns yellow (jaundice).  You have a decrease in the amount of urine or are urinating less often.  Your urine turns a dark color or changes to pink, red, or brown. Document Released: 01/09/2000 Document Revised: 04/05/2011 Document Reviewed: 08/28/2007 [facility] Patient Information 2014 [place],  [place].  _______________________________________________________________________

## 2019-12-26 NOTE — Progress Notes (Addendum)
COVID Vaccine Completed: x2 Date COVID Vaccine completed:  02-18-19 & 03-18-19 COVID vaccine manufacturer: Pfizer    Moderna   [employer]   PCP - Lucianne Lei, MD Cardiologist - Pt saw cardiology 5+ years ago due to syncopal episode, workup negative per patient.  No further follow up  Chest x-ray -  EKG - 12-27-19 in Epic Stress Test -  ECHO -  Cardiac Cath -  Pacemaker/ICD device last checked:  Sleep Study - 15 years ago, neg sleep apnea CPAP - No  Fasting Blood Sugar - 110 to 160 Checks Blood Sugar -  1 time a day  Blood Thinner Instructions: Aspirin Instructions:  ASA 81 mg  Last Dose:  Anesthesia review:   Patient denies shortness of breath, fever, cough and chest pain at PAT appointment.  Pt able to climb a flight of stairs and perform ADLs without assistance.   Patient verbalized understanding of instructions that were given to them at the PAT appointment. Patient was also instructed that they will need to review over the PAT instructions again at home before surgery.

## 2019-12-27 ENCOUNTER — Encounter (HOSPITAL_COMMUNITY): Payer: Self-pay

## 2019-12-27 ENCOUNTER — Encounter (HOSPITAL_COMMUNITY)
Admission: RE | Admit: 2019-12-27 | Discharge: 2019-12-27 | Disposition: A | Discharge status: Home / Self Care | Payer: Medicare Other | Source: Ambulatory Visit | Attending: General Surgery | Admitting: General Surgery

## 2019-12-27 ENCOUNTER — Other Ambulatory Visit: Payer: Self-pay

## 2019-12-27 DIAGNOSIS — I10 Essential (primary) hypertension: Secondary | ICD-10-CM | POA: Diagnosis not present

## 2019-12-27 DIAGNOSIS — Z01818 Encounter for other preprocedural examination: Secondary | ICD-10-CM | POA: Diagnosis not present

## 2019-12-27 HISTORY — DX: Unspecified asthma, uncomplicated: J45.909

## 2019-12-27 LAB — BASIC METABOLIC PANEL
Anion gap: 12 (ref 5–15)
BUN: 20 mg/dL (ref 8–23)
CO2: 23 mmol/L (ref 22–32)
Calcium: 9 mg/dL (ref 8.9–10.3)
Chloride: 108 mmol/L (ref 98–111)
Creatinine, Ser: 1.31 mg/dL — ABNORMAL HIGH (ref 0.61–1.24)
GFR, Estimated: 59 mL/min — ABNORMAL LOW (ref >60)
Glucose, Bld: 155 mg/dL — ABNORMAL HIGH (ref 70–99)
Potassium: 3.7 mmol/L (ref 3.5–5.1)
Sodium: 143 mmol/L (ref 135–145)

## 2019-12-27 LAB — CBC
HCT: 33.3 % — ABNORMAL LOW (ref 39.0–52.0)
Hemoglobin: 10.6 g/dL — ABNORMAL LOW (ref 13.0–17.0)
MCH: 26.5 pg (ref 26.0–34.0)
MCHC: 31.8 g/dL (ref 30.0–36.0)
MCV: 83.3 fL (ref 80.0–100.0)
Platelets: 298 10*3/uL (ref 150–400)
RBC: 4 MIL/uL — ABNORMAL LOW (ref 4.22–5.81)
RDW: 14.6 % (ref 11.5–15.5)
WBC: 5.2 10*3/uL (ref 4.0–10.5)
nRBC: 0 % (ref 0.0–0.2)

## 2019-12-27 LAB — GLUCOSE, CAPILLARY: Glucose-Capillary: 149 mg/dL — ABNORMAL HIGH (ref 70–99)

## 2019-12-27 LAB — HEMOGLOBIN A1C
Hgb A1c MFr Bld: 6.3 % — ABNORMAL HIGH (ref 4.8–5.6)
Mean Plasma Glucose: 134.11 mg/dL

## 2019-12-29 ENCOUNTER — Other Ambulatory Visit (HOSPITAL_COMMUNITY)
Admission: RE | Admit: 2019-12-29 | Discharge: 2019-12-29 | Disposition: A | Discharge status: Home / Self Care | Payer: Medicare Other | Source: Ambulatory Visit | Attending: General Surgery | Admitting: General Surgery

## 2019-12-29 DIAGNOSIS — Z20822 Contact with and (suspected) exposure to covid-19: Secondary | ICD-10-CM | POA: Insufficient documentation

## 2019-12-29 DIAGNOSIS — Z01812 Encounter for preprocedural laboratory examination: Secondary | ICD-10-CM | POA: Insufficient documentation

## 2019-12-30 LAB — SARS CORONAVIRUS 2 (TAT 6-24 HRS): SARS Coronavirus 2: NEGATIVE

## 2020-01-01 MED ORDER — BUPIVACAINE LIPOSOME 1.3 % IJ SUSP
20.0000 mL | Freq: Once | INTRAMUSCULAR | Status: DC
  Filled 2020-01-01: qty 20

## 2020-01-01 NOTE — Anesthesia Preprocedure Evaluation (Addendum)
Anesthesia Evaluation  Patient identified by MRN, date of birth, ID band Patient awake    Reviewed: Allergy & Precautions, NPO status , Patient's Chart, lab work & pertinent test results  Airway Mallampati: III  TM Distance: >3 FB Neck ROM: Full    Dental no notable dental hx. (+) Teeth Intact, Dental Advisory Given   Pulmonary neg pulmonary ROS,    Pulmonary exam normal breath sounds clear to auscultation       Cardiovascular hypertension, Pt. on medications negative cardio ROS Normal cardiovascular exam Rhythm:Regular Rate:Normal     Neuro/Psych negative neurological ROS  negative psych ROS   GI/Hepatic negative GI ROS, Neg liver ROS,   Endo/Other  negative endocrine ROSdiabetes, Oral Hypoglycemic Agents  Renal/GU Renal InsufficiencyRenal disease (Cr 1.3, K 3.7)  negative genitourinary   Musculoskeletal negative musculoskeletal ROS (+)   Abdominal   Peds  Hematology  (+) Blood dyscrasia (Hgb 10.6), anemia ,   Anesthesia Other Findings diverticulitis  Reproductive/Obstetrics                            Anesthesia Physical Anesthesia Plan  ASA: II  Anesthesia Plan: General   Post-op Pain Management:    Induction: Intravenous  PONV Risk Score and Plan: 2 and Midazolam, Dexamethasone and Ondansetron  Airway Management Planned: Oral ETT  Additional Equipment:   Intra-op Plan:   Post-operative Plan: Extubation in OR  Informed Consent: I have reviewed the patients History and Physical, chart, labs and discussed the procedure including the risks, benefits and alternatives for the proposed anesthesia with the patient or authorized representative who has indicated his/her understanding and acceptance.     Dental advisory given  Plan Discussed with: CRNA  Anesthesia Plan Comments: (2nd IV)       Anesthesia Quick Evaluation

## 2020-01-02 ENCOUNTER — Encounter (HOSPITAL_COMMUNITY): Payer: Self-pay | Admitting: General Surgery

## 2020-01-02 ENCOUNTER — Other Ambulatory Visit: Payer: Self-pay

## 2020-01-02 ENCOUNTER — Inpatient Hospital Stay (HOSPITAL_COMMUNITY): Payer: Medicare Other | Admitting: Anesthesiology

## 2020-01-02 ENCOUNTER — Inpatient Hospital Stay (HOSPITAL_COMMUNITY)
Admission: RE | Admit: 2020-01-02 | Discharge: 2020-01-05 | DRG: 330 | Disposition: A | Discharge status: Home / Self Care | Payer: Medicare Other | Attending: General Surgery | Admitting: General Surgery

## 2020-01-02 ENCOUNTER — Encounter (HOSPITAL_COMMUNITY)
Admission: RE | Disposition: A | Discharge status: Home / Self Care | Payer: Self-pay | Source: Home / Self Care | Attending: General Surgery

## 2020-01-02 DIAGNOSIS — R319 Hematuria, unspecified: Secondary | ICD-10-CM | POA: Diagnosis present

## 2020-01-02 DIAGNOSIS — I10 Essential (primary) hypertension: Secondary | ICD-10-CM | POA: Diagnosis present

## 2020-01-02 DIAGNOSIS — Z87440 Personal history of urinary (tract) infections: Secondary | ICD-10-CM

## 2020-01-02 DIAGNOSIS — Z20822 Contact with and (suspected) exposure to covid-19: Secondary | ICD-10-CM | POA: Diagnosis present

## 2020-01-02 DIAGNOSIS — Z23 Encounter for immunization: Secondary | ICD-10-CM

## 2020-01-02 DIAGNOSIS — Z8546 Personal history of malignant neoplasm of prostate: Secondary | ICD-10-CM | POA: Diagnosis not present

## 2020-01-02 DIAGNOSIS — K5720 Diverticulitis of large intestine with perforation and abscess without bleeding: Secondary | ICD-10-CM | POA: Diagnosis present

## 2020-01-02 DIAGNOSIS — Z7984 Long term (current) use of oral hypoglycemic drugs: Secondary | ICD-10-CM | POA: Diagnosis not present

## 2020-01-02 DIAGNOSIS — L02211 Cutaneous abscess of abdominal wall: Secondary | ICD-10-CM | POA: Diagnosis present

## 2020-01-02 DIAGNOSIS — N321 Vesicointestinal fistula: Secondary | ICD-10-CM | POA: Diagnosis present

## 2020-01-02 DIAGNOSIS — J45909 Unspecified asthma, uncomplicated: Secondary | ICD-10-CM | POA: Diagnosis present

## 2020-01-02 DIAGNOSIS — Z79899 Other long term (current) drug therapy: Secondary | ICD-10-CM | POA: Diagnosis not present

## 2020-01-02 DIAGNOSIS — K5792 Diverticulitis of intestine, part unspecified, without perforation or abscess without bleeding: Secondary | ICD-10-CM | POA: Diagnosis present

## 2020-01-02 DIAGNOSIS — Z7982 Long term (current) use of aspirin: Secondary | ICD-10-CM

## 2020-01-02 DIAGNOSIS — D649 Anemia, unspecified: Secondary | ICD-10-CM | POA: Diagnosis present

## 2020-01-02 DIAGNOSIS — K5790 Diverticulosis of intestine, part unspecified, without perforation or abscess without bleeding: Secondary | ICD-10-CM | POA: Diagnosis present

## 2020-01-02 DIAGNOSIS — E119 Type 2 diabetes mellitus without complications: Secondary | ICD-10-CM | POA: Diagnosis present

## 2020-01-02 DIAGNOSIS — Z8249 Family history of ischemic heart disease and other diseases of the circulatory system: Secondary | ICD-10-CM | POA: Diagnosis not present

## 2020-01-02 DIAGNOSIS — I9581 Postprocedural hypotension: Secondary | ICD-10-CM | POA: Diagnosis not present

## 2020-01-02 DIAGNOSIS — D539 Nutritional anemia, unspecified: Secondary | ICD-10-CM

## 2020-01-02 HISTORY — PX: CYSTOSCOPY WITH STENT PLACEMENT: SHX5790

## 2020-01-02 LAB — GLUCOSE, CAPILLARY
Glucose-Capillary: 113 mg/dL — ABNORMAL HIGH (ref 70–99)
Glucose-Capillary: 113 mg/dL — ABNORMAL HIGH (ref 70–99)
Glucose-Capillary: 134 mg/dL — ABNORMAL HIGH (ref 70–99)
Glucose-Capillary: 271 mg/dL — ABNORMAL HIGH (ref 70–99)

## 2020-01-02 LAB — TYPE AND SCREEN
ABO/RH(D): A POS
Antibody Screen: NEGATIVE

## 2020-01-02 LAB — ABO/RH: ABO/RH(D): A POS

## 2020-01-02 SURGERY — COLECTOMY, PARTIAL, ROBOT-ASSISTED, LAPAROSCOPIC
Anesthesia: General | Site: Abdomen

## 2020-01-02 MED ORDER — ALBUMIN HUMAN 5 % IV SOLN
INTRAVENOUS | Status: AC
  Filled 2020-01-02: qty 250

## 2020-01-02 MED ORDER — INSULIN ASPART 100 UNIT/ML ~~LOC~~ SOLN
0.0000 [IU] | Freq: Three times a day (TID) | SUBCUTANEOUS | Status: DC
  Administered 2020-01-02 – 2020-01-03 (×3): 3 [IU] via SUBCUTANEOUS
  Administered 2020-01-03 – 2020-01-04 (×3): 4 [IU] via SUBCUTANEOUS

## 2020-01-02 MED ORDER — KETAMINE HCL 10 MG/ML IJ SOLN
INTRAMUSCULAR | Status: DC | PRN
  Administered 2020-01-02: 30 mg via INTRAVENOUS

## 2020-01-02 MED ORDER — ALVIMOPAN 12 MG PO CAPS
12.0000 mg | ORAL_CAPSULE | ORAL | Status: AC
  Administered 2020-01-02: 12 mg via ORAL
  Filled 2020-01-02: qty 1

## 2020-01-02 MED ORDER — INSULIN ASPART 100 UNIT/ML ~~LOC~~ SOLN
0.0000 [IU] | Freq: Every day | SUBCUTANEOUS | Status: DC
  Administered 2020-01-02: 22:00:00 3 [IU] via SUBCUTANEOUS

## 2020-01-02 MED ORDER — LIDOCAINE 2% (20 MG/ML) 5 ML SYRINGE
INTRAMUSCULAR | Status: DC | PRN
  Administered 2020-01-02: 5.4 mL/h via INTRAVENOUS

## 2020-01-02 MED ORDER — METFORMIN HCL 500 MG PO TABS
1000.0000 mg | ORAL_TABLET | Freq: Two times a day (BID) | ORAL | Status: DC
  Administered 2020-01-04 – 2020-01-05 (×3): 1000 mg via ORAL
  Filled 2020-01-02 (×4): qty 2

## 2020-01-02 MED ORDER — FENTANYL CITRATE (PF) 100 MCG/2ML IJ SOLN
INTRAMUSCULAR | Status: DC | PRN
  Administered 2020-01-02 (×4): 50 ug via INTRAVENOUS
  Administered 2020-01-02: 100 ug via INTRAVENOUS

## 2020-01-02 MED ORDER — KETOROLAC TROMETHAMINE 30 MG/ML IJ SOLN
INTRAMUSCULAR | Status: AC
  Filled 2020-01-02: qty 1

## 2020-01-02 MED ORDER — ACETAMINOPHEN 500 MG PO TABS: 1000.0000 mg | ORAL_TABLET | Freq: Once | ORAL | Status: DC

## 2020-01-02 MED ORDER — PROPOFOL 10 MG/ML IV BOLUS
INTRAVENOUS | Status: DC | PRN
  Administered 2020-01-02: 120 mg via INTRAVENOUS

## 2020-01-02 MED ORDER — ENSURE PRE-SURGERY PO LIQD: 592.0000 mL | Freq: Once | ORAL | Status: DC

## 2020-01-02 MED ORDER — ONDANSETRON HCL 4 MG/2ML IJ SOLN: 4.0000 mg | Freq: Four times a day (QID) | INTRAMUSCULAR | Status: DC | PRN

## 2020-01-02 MED ORDER — BUPIVACAINE LIPOSOME 1.3 % IJ SUSP
INTRAMUSCULAR | Status: DC | PRN
  Administered 2020-01-02: 20 mL

## 2020-01-02 MED ORDER — SODIUM CHLORIDE 0.9 % IV SOLN
2.0000 g | INTRAVENOUS | Status: AC
  Administered 2020-01-02: 2 g via INTRAVENOUS
  Filled 2020-01-02: qty 2

## 2020-01-02 MED ORDER — 0.9 % SODIUM CHLORIDE (POUR BTL) OPTIME
TOPICAL | Status: DC | PRN
  Administered 2020-01-02: 2000 mL

## 2020-01-02 MED ORDER — DEXAMETHASONE SODIUM PHOSPHATE 10 MG/ML IJ SOLN
INTRAMUSCULAR | Status: DC | PRN
  Administered 2020-01-02: 5 mg via INTRAVENOUS

## 2020-01-02 MED ORDER — ROCURONIUM BROMIDE 10 MG/ML (PF) SYRINGE
PREFILLED_SYRINGE | INTRAVENOUS | Status: DC | PRN
  Administered 2020-01-02: 20 mg via INTRAVENOUS
  Administered 2020-01-02: 100 mg via INTRAVENOUS

## 2020-01-02 MED ORDER — ENSURE SURGERY PO LIQD
237.0000 mL | Freq: Two times a day (BID) | ORAL | Status: DC
  Administered 2020-01-02 – 2020-01-05 (×6): 237 mL via ORAL

## 2020-01-02 MED ORDER — LIDOCAINE 2% (20 MG/ML) 5 ML SYRINGE
INTRAMUSCULAR | Status: DC | PRN
  Administered 2020-01-02: 80 mg via INTRAVENOUS

## 2020-01-02 MED ORDER — MONTELUKAST SODIUM 10 MG PO TABS
10.0000 mg | ORAL_TABLET | Freq: Every day | ORAL | Status: DC
  Administered 2020-01-02 – 2020-01-04 (×3): 10 mg via ORAL
  Filled 2020-01-02 (×4): qty 1

## 2020-01-02 MED ORDER — OLMESARTAN-AMLODIPINE-HCTZ 20-5-12.5 MG PO TABS: 1.0000 | ORAL_TABLET | Freq: Every day | ORAL | Status: DC

## 2020-01-02 MED ORDER — BUPIVACAINE-EPINEPHRINE 0.25% -1:200000 IJ SOLN
INTRAMUSCULAR | Status: AC
  Filled 2020-01-02: qty 1

## 2020-01-02 MED ORDER — ENOXAPARIN SODIUM 40 MG/0.4ML ~~LOC~~ SOLN
40.0000 mg | SUBCUTANEOUS | Status: DC
  Administered 2020-01-03 – 2020-01-05 (×3): 40 mg via SUBCUTANEOUS
  Filled 2020-01-02 (×3): qty 0.4

## 2020-01-02 MED ORDER — ACETAMINOPHEN 500 MG PO TABS
1000.0000 mg | ORAL_TABLET | ORAL | Status: AC
  Administered 2020-01-02: 1000 mg via ORAL
  Filled 2020-01-02: qty 2

## 2020-01-02 MED ORDER — ALBUMIN HUMAN 5 % IV SOLN: INTRAVENOUS | Status: DC | PRN

## 2020-01-02 MED ORDER — PHENYLEPHRINE 40 MCG/ML (10ML) SYRINGE FOR IV PUSH (FOR BLOOD PRESSURE SUPPORT)
PREFILLED_SYRINGE | INTRAVENOUS | Status: DC | PRN
  Administered 2020-01-02: 40 ug via INTRAVENOUS
  Administered 2020-01-02: 120 ug via INTRAVENOUS
  Administered 2020-01-02: 80 ug via INTRAVENOUS
  Administered 2020-01-02: 120 ug via INTRAVENOUS
  Administered 2020-01-02 (×3): 80 ug via INTRAVENOUS

## 2020-01-02 MED ORDER — ONDANSETRON HCL 4 MG/2ML IJ SOLN
INTRAMUSCULAR | Status: DC | PRN
  Administered 2020-01-02: 4 mg via INTRAVENOUS

## 2020-01-02 MED ORDER — LIRAGLUTIDE 18 MG/3ML ~~LOC~~ SOPN: 1.8000 mg | PEN_INJECTOR | Freq: Every day | SUBCUTANEOUS | Status: DC

## 2020-01-02 MED ORDER — LACTATED RINGERS IV SOLN: INTRAVENOUS | Status: DC | PRN

## 2020-01-02 MED ORDER — STERILE WATER FOR INJECTION IJ SOLN
INTRAMUSCULAR | Status: AC
  Filled 2020-01-02: qty 10

## 2020-01-02 MED ORDER — ENSURE PRE-SURGERY PO LIQD
296.0000 mL | Freq: Once | ORAL | Status: DC
  Filled 2020-01-02: qty 296

## 2020-01-02 MED ORDER — AMLODIPINE BESYLATE 5 MG PO TABS
5.0000 mg | ORAL_TABLET | Freq: Every day | ORAL | Status: DC
  Administered 2020-01-02: 16:00:00 5 mg via ORAL
  Filled 2020-01-02 (×2): qty 1

## 2020-01-02 MED ORDER — INSULIN DEGLUDEC 200 UNIT/ML ~~LOC~~ SOPN: 40.0000 [IU] | PEN_INJECTOR | Freq: Every day | SUBCUTANEOUS | Status: DC

## 2020-01-02 MED ORDER — SACCHAROMYCES BOULARDII 250 MG PO CAPS
250.0000 mg | ORAL_CAPSULE | Freq: Two times a day (BID) | ORAL | Status: DC
  Administered 2020-01-02 – 2020-01-05 (×6): 250 mg via ORAL
  Filled 2020-01-02 (×6): qty 1

## 2020-01-02 MED ORDER — METHYLENE BLUE 0.5 % INJ SOLN
INTRAVENOUS | Status: DC | PRN
  Administered 2020-01-02: 10 mL

## 2020-01-02 MED ORDER — HYDROMORPHONE HCL 1 MG/ML IJ SOLN
0.5000 mg | INTRAMUSCULAR | Status: DC | PRN
  Administered 2020-01-02 – 2020-01-03 (×2): 0.5 mg via INTRAVENOUS
  Filled 2020-01-02 (×2): qty 0.5

## 2020-01-02 MED ORDER — LACTATED RINGERS IV SOLN: INTRAVENOUS | Status: DC

## 2020-01-02 MED ORDER — TAMSULOSIN HCL 0.4 MG PO CAPS
0.4000 mg | ORAL_CAPSULE | Freq: Every day | ORAL | Status: DC
  Administered 2020-01-02 – 2020-01-04 (×3): 0.4 mg via ORAL
  Filled 2020-01-02 (×3): qty 1

## 2020-01-02 MED ORDER — GABAPENTIN 300 MG PO CAPS
300.0000 mg | ORAL_CAPSULE | ORAL | Status: AC
  Administered 2020-01-02: 300 mg via ORAL
  Filled 2020-01-02: qty 1

## 2020-01-02 MED ORDER — ALUM & MAG HYDROXIDE-SIMETH 200-200-20 MG/5ML PO SUSP: 30.0000 mL | Freq: Four times a day (QID) | ORAL | Status: DC | PRN

## 2020-01-02 MED ORDER — KCL IN DEXTROSE-NACL 20-5-0.45 MEQ/L-%-% IV SOLN
INTRAVENOUS | Status: DC
  Filled 2020-01-02 (×2): qty 1000

## 2020-01-02 MED ORDER — GABAPENTIN 300 MG PO CAPS
300.0000 mg | ORAL_CAPSULE | Freq: Two times a day (BID) | ORAL | Status: DC
  Administered 2020-01-02 – 2020-01-05 (×6): 300 mg via ORAL
  Filled 2020-01-02 (×6): qty 1

## 2020-01-02 MED ORDER — STERILE WATER FOR IRRIGATION IR SOLN
Status: DC | PRN
  Administered 2020-01-02: 400 mL via INTRAVESICAL

## 2020-01-02 MED ORDER — EPHEDRINE SULFATE-NACL 50-0.9 MG/10ML-% IV SOSY
PREFILLED_SYRINGE | INTRAVENOUS | Status: DC | PRN
  Administered 2020-01-02: 10 mg via INTRAVENOUS

## 2020-01-02 MED ORDER — BUPIVACAINE-EPINEPHRINE 0.25% -1:200000 IJ SOLN
INTRAMUSCULAR | Status: DC | PRN
  Administered 2020-01-02: 30 mL

## 2020-01-02 MED ORDER — FENTANYL CITRATE (PF) 100 MCG/2ML IJ SOLN
INTRAMUSCULAR | Status: AC
  Filled 2020-01-02: qty 2

## 2020-01-02 MED ORDER — ALVIMOPAN 12 MG PO CAPS: 12.0000 mg | ORAL_CAPSULE | Freq: Two times a day (BID) | ORAL | Status: DC

## 2020-01-02 MED ORDER — METHYLENE BLUE 0.5 % INJ SOLN
INTRAVENOUS | Status: AC
  Filled 2020-01-02: qty 10

## 2020-01-02 MED ORDER — DIPHENHYDRAMINE HCL 12.5 MG/5ML PO ELIX: 12.5000 mg | ORAL_SOLUTION | Freq: Four times a day (QID) | ORAL | Status: DC | PRN

## 2020-01-02 MED ORDER — ORAL CARE MOUTH RINSE: 15.0000 mL | Freq: Once | OROMUCOSAL | Status: AC

## 2020-01-02 MED ORDER — FENTANYL CITRATE (PF) 100 MCG/2ML IJ SOLN: 25.0000 ug | INTRAMUSCULAR | Status: DC | PRN

## 2020-01-02 MED ORDER — STERILE WATER FOR INJECTION IJ SOLN
INTRAMUSCULAR | Status: DC | PRN
  Administered 2020-01-02: 15 mL via URETERAL

## 2020-01-02 MED ORDER — HYDROCHLOROTHIAZIDE 12.5 MG PO CAPS
12.5000 mg | ORAL_CAPSULE | Freq: Every day | ORAL | Status: DC
  Administered 2020-01-02 – 2020-01-03 (×2): 12.5 mg via ORAL
  Filled 2020-01-02 (×2): qty 1

## 2020-01-02 MED ORDER — SUGAMMADEX SODIUM 200 MG/2ML IV SOLN
INTRAVENOUS | Status: DC | PRN
  Administered 2020-01-02: 200 mg via INTRAVENOUS

## 2020-01-02 MED ORDER — ONDANSETRON HCL 4 MG PO TABS: 4.0000 mg | ORAL_TABLET | Freq: Four times a day (QID) | ORAL | Status: DC | PRN

## 2020-01-02 MED ORDER — TESTOSTERONE 20.25 MG/ACT (1.62%) TD GEL: 2.0000 | Freq: Every day | TRANSDERMAL | Status: DC

## 2020-01-02 MED ORDER — PROPOFOL 10 MG/ML IV BOLUS
INTRAVENOUS | Status: AC
  Filled 2020-01-02: qty 20

## 2020-01-02 MED ORDER — INSULIN GLARGINE 100 UNIT/ML ~~LOC~~ SOLN
40.0000 [IU] | Freq: Every day | SUBCUTANEOUS | Status: DC
  Administered 2020-01-03 – 2020-01-05 (×3): 40 [IU] via SUBCUTANEOUS
  Filled 2020-01-02 (×3): qty 0.4

## 2020-01-02 MED ORDER — EPHEDRINE 5 MG/ML INJ
INTRAVENOUS | Status: AC
  Filled 2020-01-02: qty 10

## 2020-01-02 MED ORDER — FENTANYL CITRATE (PF) 250 MCG/5ML IJ SOLN
INTRAMUSCULAR | Status: AC
  Filled 2020-01-02: qty 5

## 2020-01-02 MED ORDER — IRBESARTAN 150 MG PO TABS
150.0000 mg | ORAL_TABLET | Freq: Every day | ORAL | Status: DC
  Administered 2020-01-02 – 2020-01-03 (×2): 150 mg via ORAL
  Filled 2020-01-02 (×2): qty 1

## 2020-01-02 MED ORDER — PHENYLEPHRINE HCL (PRESSORS) 10 MG/ML IV SOLN
INTRAVENOUS | Status: AC
  Filled 2020-01-02: qty 1

## 2020-01-02 MED ORDER — CHLORHEXIDINE GLUCONATE 0.12 % MT SOLN
15.0000 mL | Freq: Once | OROMUCOSAL | Status: AC
  Administered 2020-01-02: 15 mL via OROMUCOSAL

## 2020-01-02 MED ORDER — SODIUM CHLORIDE 0.9 % IV SOLN
2.0000 g | Freq: Two times a day (BID) | INTRAVENOUS | Status: AC
  Administered 2020-01-02: 2 g via INTRAVENOUS
  Filled 2020-01-02: qty 2

## 2020-01-02 MED ORDER — ACETAMINOPHEN 500 MG PO TABS
1000.0000 mg | ORAL_TABLET | Freq: Four times a day (QID) | ORAL | Status: DC
  Administered 2020-01-02 – 2020-01-05 (×11): 1000 mg via ORAL
  Filled 2020-01-02 (×11): qty 2

## 2020-01-02 MED ORDER — SODIUM CHLORIDE 0.9 % IR SOLN
Status: DC | PRN
  Administered 2020-01-02: 1000 mL

## 2020-01-02 MED ORDER — DIPHENHYDRAMINE HCL 50 MG/ML IJ SOLN: 12.5000 mg | Freq: Four times a day (QID) | INTRAMUSCULAR | Status: DC | PRN

## 2020-01-02 MED ORDER — KETAMINE HCL 10 MG/ML IJ SOLN
INTRAMUSCULAR | Status: AC
  Filled 2020-01-02: qty 1

## 2020-01-02 MED ORDER — PHENYLEPHRINE HCL-NACL 10-0.9 MG/250ML-% IV SOLN
INTRAVENOUS | Status: DC | PRN
  Administered 2020-01-02: 20 ug/min via INTRAVENOUS

## 2020-01-02 SURGICAL SUPPLY — 104 items
BAG URO CATCHER STRL LF (MISCELLANEOUS) ×4 IMPLANT
BLADE EXTENDED COATED 6.5IN (ELECTRODE) IMPLANT
CANNULA REDUC XI 12-8 STAPL (CANNULA)
CANNULA REDUC XI 12-8MM STAPL (CANNULA)
CANNULA REDUCER 12-8 DVNC XI (CANNULA) IMPLANT
CATH URET 5FR 28IN OPEN ENDED (CATHETERS) ×4 IMPLANT
CELLS DAT CNTRL 66122 CELL SVR (MISCELLANEOUS) IMPLANT
CLOTH BEACON ORANGE TIMEOUT ST (SAFETY) ×4 IMPLANT
COVER SURGICAL LIGHT HANDLE (MISCELLANEOUS) ×8 IMPLANT
COVER TIP SHEARS 8 DVNC (MISCELLANEOUS) ×2 IMPLANT
COVER TIP SHEARS 8MM DA VINCI (MISCELLANEOUS) ×4
COVER WAND RF STERILE (DRAPES) ×4 IMPLANT
DECANTER SPIKE VIAL GLASS SM (MISCELLANEOUS) IMPLANT
DERMABOND ADVANCED (GAUZE/BANDAGES/DRESSINGS) ×2
DERMABOND ADVANCED .7 DNX12 (GAUZE/BANDAGES/DRESSINGS) ×2 IMPLANT
DRAIN CHANNEL 19F RND (DRAIN) IMPLANT
DRAPE ARM DVNC X/XI (DISPOSABLE) ×8 IMPLANT
DRAPE COLUMN DVNC XI (DISPOSABLE) ×2 IMPLANT
DRAPE DA VINCI XI ARM (DISPOSABLE) ×16
DRAPE DA VINCI XI COLUMN (DISPOSABLE) ×4
DRAPE SURG IRRIG POUCH 19X23 (DRAPES) ×4 IMPLANT
DRSG OPSITE POSTOP 4X10 (GAUZE/BANDAGES/DRESSINGS) IMPLANT
DRSG OPSITE POSTOP 4X6 (GAUZE/BANDAGES/DRESSINGS) ×4 IMPLANT
DRSG OPSITE POSTOP 4X8 (GAUZE/BANDAGES/DRESSINGS) IMPLANT
DRSG TEGADERM 4X4.75 (GAUZE/BANDAGES/DRESSINGS) ×4 IMPLANT
ELECT PENCIL ROCKER SW 15FT (MISCELLANEOUS) ×4 IMPLANT
ELECT REM PT RETURN 15FT ADLT (MISCELLANEOUS) ×4 IMPLANT
ENDOLOOP SUT PDS II  0 18 (SUTURE)
ENDOLOOP SUT PDS II 0 18 (SUTURE) IMPLANT
EVACUATOR SILICONE 100CC (DRAIN) IMPLANT
GLOVE BIO SURGEON STRL SZ 6.5 (GLOVE) ×9 IMPLANT
GLOVE BIO SURGEONS STRL SZ 6.5 (GLOVE) ×3
GLOVE BIOGEL M STRL SZ7.5 (GLOVE) ×4 IMPLANT
GLOVE BIOGEL PI IND STRL 7.0 (GLOVE) ×4 IMPLANT
GLOVE BIOGEL PI INDICATOR 7.0 (GLOVE) ×4
GOWN STRL REUS W/TWL XL LVL3 (GOWN DISPOSABLE) ×16 IMPLANT
GRASPER SUT TROCAR 14GX15 (MISCELLANEOUS) IMPLANT
GUIDEWIRE STR DUAL SENSOR (WIRE) IMPLANT
GUIDEWIRE ZIPWRE .038 STRAIGHT (WIRE) ×4 IMPLANT
HANDLE SUCTION POOLE (INSTRUMENTS) ×2 IMPLANT
HOLDER FOLEY CATH W/STRAP (MISCELLANEOUS) ×4 IMPLANT
IRRIG SUCT STRYKERFLOW 2 WTIP (MISCELLANEOUS) ×4
IRRIGATION SUCT STRKRFLW 2 WTP (MISCELLANEOUS) ×2 IMPLANT
KIT PROCEDURE DA VINCI SI (MISCELLANEOUS) ×4
KIT PROCEDURE DVNC SI (MISCELLANEOUS) ×2 IMPLANT
KIT SIGMOIDOSCOPE (SET/KITS/TRAYS/PACK) ×4 IMPLANT
KIT TURNOVER KIT A (KITS) ×4 IMPLANT
MANIFOLD NEPTUNE II (INSTRUMENTS) ×4 IMPLANT
NDL SAFETY ECLIPSE 18X1.5 (NEEDLE) ×2 IMPLANT
NEEDLE HYPO 18GX1.5 SHARP (NEEDLE) ×4
NEEDLE INSUFFLATION 14GA 120MM (NEEDLE) ×4 IMPLANT
PACK CARDIOVASCULAR III (CUSTOM PROCEDURE TRAY) ×4 IMPLANT
PACK COLON (CUSTOM PROCEDURE TRAY) ×4 IMPLANT
PACK CYSTO (CUSTOM PROCEDURE TRAY) ×4 IMPLANT
PAD POSITIONING PINK XL (MISCELLANEOUS) ×4 IMPLANT
PORT LAP GEL ALEXIS MED 5-9CM (MISCELLANEOUS) ×4 IMPLANT
RELOAD STAPLER 4.3X60 GRN DVNC (STAPLE) ×2 IMPLANT
RTRCTR WOUND ALEXIS 18CM MED (MISCELLANEOUS)
SCISSORS LAP 5X35 DISP (ENDOMECHANICALS) ×4 IMPLANT
SEAL CANN UNIV 5-8 DVNC XI (MISCELLANEOUS) ×8 IMPLANT
SEAL XI 5MM-8MM UNIVERSAL (MISCELLANEOUS) ×16
SEALER VESSEL DA VINCI XI (MISCELLANEOUS) ×4
SEALER VESSEL EXT DVNC XI (MISCELLANEOUS) ×2 IMPLANT
SET IRRIG Y TYPE TUR BLADDER L (SET/KITS/TRAYS/PACK) ×4 IMPLANT
SOLUTION ELECTROLUBE (MISCELLANEOUS) ×4 IMPLANT
SPONGE LAP 18X18 RF (DISPOSABLE) ×4 IMPLANT
STAPLER 60 DA VINCI SURE FORM (STAPLE) ×4
STAPLER 60 SUREFORM DVNC (STAPLE) ×2 IMPLANT
STAPLER CANNULA SEAL DVNC XI (STAPLE) IMPLANT
STAPLER CANNULA SEAL XI (STAPLE)
STAPLER ECHELON POWER CIR 29 (STAPLE) ×4 IMPLANT
STAPLER ECHELON POWER CIR 31 (STAPLE) IMPLANT
STAPLER RELOAD 4.3X60 GREEN (STAPLE) ×4
STAPLER RELOAD 4.3X60 GRN DVNC (STAPLE) ×2
STOPCOCK 4 WAY LG BORE MALE ST (IV SETS) ×8 IMPLANT
SUCTION POOLE HANDLE (INSTRUMENTS) ×4
SUT ETHILON 2 0 PS N (SUTURE) IMPLANT
SUT NOVA NAB DX-16 0-1 5-0 T12 (SUTURE) ×8 IMPLANT
SUT PROLENE 2 0 KS (SUTURE) ×4 IMPLANT
SUT SILK 2 0 (SUTURE) ×4
SUT SILK 2 0 SH CR/8 (SUTURE) IMPLANT
SUT SILK 2-0 18XBRD TIE 12 (SUTURE) ×2 IMPLANT
SUT SILK 3 0 (SUTURE)
SUT SILK 3 0 SH CR/8 (SUTURE) ×4 IMPLANT
SUT SILK 3-0 18XBRD TIE 12 (SUTURE) IMPLANT
SUT V-LOC BARB 180 2/0GR6 GS22 (SUTURE)
SUT VIC AB 2-0 SH 18 (SUTURE) IMPLANT
SUT VIC AB 2-0 SH 27 (SUTURE)
SUT VIC AB 2-0 SH 27X BRD (SUTURE) IMPLANT
SUT VIC AB 3-0 SH 18 (SUTURE) IMPLANT
SUT VIC AB 4-0 PS2 27 (SUTURE) ×8 IMPLANT
SUT VICRYL 0 UR6 27IN ABS (SUTURE) ×4 IMPLANT
SUTURE V-LC BRB 180 2/0GR6GS22 (SUTURE) IMPLANT
SYR 10ML ECCENTRIC (SYRINGE) ×4 IMPLANT
SYR 10ML LL (SYRINGE) ×4 IMPLANT
SYS LAPSCP GELPORT 120MM (MISCELLANEOUS)
SYSTEM LAPSCP GELPORT 120MM (MISCELLANEOUS) IMPLANT
TOWEL OR 17X26 10 PK STRL BLUE (TOWEL DISPOSABLE) IMPLANT
TOWEL OR NON WOVEN STRL DISP B (DISPOSABLE) ×4 IMPLANT
TRAY FOLEY MTR SLVR 16FR STAT (SET/KITS/TRAYS/PACK) ×4 IMPLANT
TROCAR ADV FIXATION 5X100MM (TROCAR) ×4 IMPLANT
TUBING CONNECTING 10 (TUBING) ×9 IMPLANT
TUBING CONNECTING 10' (TUBING) ×3
TUBING INSUFFLATION 10FT LAP (TUBING) ×4 IMPLANT

## 2020-01-02 NOTE — Anesthesia Procedure Notes (Signed)
Procedure Name: Intubation Performed by: Terrelle Ruffolo J, CRNA Pre-anesthesia Checklist: Patient identified, Emergency Drugs available, Suction available, Patient being monitored and Timeout performed Patient Re-evaluated:Patient Re-evaluated prior to induction Oxygen Delivery Method: Circle system utilized Preoxygenation: Pre-oxygenation with 100% oxygen Induction Type: IV induction Ventilation: Mask ventilation without difficulty Laryngoscope Size: Mac and 4 Grade View: Grade I Tube type: Oral Tube size: 7.5 mm Number of attempts: 1 Airway Equipment and Method: Stylet Placement Confirmation: ETT inserted through vocal cords under direct vision,  positive ETCO2 and breath sounds checked- equal and bilateral Secured at: 23 cm Tube secured with: Tape Dental Injury: Teeth and Oropharynx as per pre-operative assessment        

## 2020-01-02 NOTE — Consult Note (Signed)
Urology Consult   Physician requesting consult: Leighton Ruff, MD  Reason for consult: Ureteral firefly injections  History of Present Illness: Alexander Foster is a 69 y.o. male with a history of diverticular disease with possible colovesical fistula requiring robotic partial colectomy with Dr. Marcello Moores.  Urology has been consulted to inject firefly into both ureters to aid in identification intraoperatively.  He reports a recent history of recurrent urinary tract infections as well as passing debris with occasional episodes of pneumaturia and hematuria.  He denies flank pain, fever/chills or nausea/vomiting.  Past Medical History:  Diagnosis Date  . Anemia   . Asthma    As a child  . Cancer ([place])    seed implant for prostate cancer  . Diabetes mellitus without complication ([place])   . Hypertension     Past Surgical History:  Procedure Laterality Date  . PROSTATE BIOPSY    . RADIOACTIVE SEED IMPLANT  2010  . TONSILLECTOMY    . WISDOM TOOTH EXTRACTION      Current Hospital Medications:  Home Meds:  Current Meds  Medication Sig  . ANDROGEL PUMP 20.25 MG/ACT (1.62%) GEL Apply 2 Pump topically daily. Applied to each side of chest daily in the morning.  Marland Kitchen aspirin EC 81 MG tablet Take 81 mg by mouth at bedtime. Swallow whole.  . Iron-FA-B Cmp-C-Biot-Probiotic (FUSION PLUS) CAPS Take 1 capsule by mouth at bedtime.   . metFORMIN (GLUCOPHAGE) 1000 MG tablet Take 1,000 mg by mouth in the morning and at bedtime.   . montelukast (SINGULAIR) 10 MG tablet Take 10 mg by mouth at bedtime.   . Multiple Vitamin (MULTIVITAMIN WITH MINERALS) TABS tablet Take 1 tablet by mouth daily.  . niacin (NIASPAN) 500 MG CR tablet Take 500 mg by mouth at bedtime.  Tyler Aas FLEXTOUCH 200 UNIT/ML FlexTouch Pen Inject 40 Units into the skin daily.  Jabier Gauss 20-5-12.5 MG TABS Take 1 tablet by mouth daily.  Marland Kitchen VICTOZA 18 MG/3ML SOPN Inject 1.8 mg into the skin daily.     Scheduled Meds: . bupivacaine  liposome  20 mL Infiltration Once  . feeding supplement  296 mL Oral Once   Continuous Infusions: . cefoTEtan (CEFOTAN) IV    . lactated ringers 10 mL/hr at 01/02/20 0713   PRN Meds:.  Allergies: No Known Allergies  Family History  Problem Relation Age of Onset  . Heart attack Mother   . Healthy Father        reports living to 62, without any major health issues    Social History:  reports that he has never smoked. He has never used smokeless tobacco. He reports current alcohol use. He reports that he does not use drugs.  ROS: A complete review of systems was performed.  All systems are negative except for pertinent findings as noted.  Physical Exam:  Vital signs in last 24 hours: Temp:  [98.5 F (36.9 C)] 98.5 F (36.9 C) (12/08 0714) Pulse Rate:  [79] 79 (12/08 0714) Resp:  [18] 18 (12/08 0714) BP: (104)/(68) 104/68 (12/08 0714) SpO2:  [100 %] 100 % (12/08 0714) Weight:  [92.5 kg] 92.5 kg (12/08 0711) Constitutional:  Alert and oriented, No acute distress Cardiovascular: Regular rate and rhythm, No JVD Respiratory: Normal respiratory effort, Lungs clear bilaterally GI: Abdomen is soft, nontender, nondistended, no abdominal masses GU: No CVA tenderness Lymphatic: No lymphadenopathy Neurologic: Grossly intact, no focal deficits Psychiatric: Normal mood and affect  Laboratory Data:  No results for input(s): WBC, HGB, HCT,  PLT in the last 72 hours.  No results for input(s): NA, K, CL, GLUCOSE, BUN, CALCIUM, CREATININE in the last 72 hours.  Invalid input(s): CO3   Results for orders placed or performed during the hospital encounter of 01/02/20 (from the past 24 hour(s))  ABO/Rh     Status: None   Collection Time: 01/02/20  7:05 AM  Result Value Ref Range   ABO/RH(D)      A POS Performed at [facility], [place] [address], [place], [place] [address]   Glucose, capillary     Status: Abnormal   Collection Time: 01/02/20  7:12 AM  Result Value Ref  Range   Glucose-Capillary 113 (H) 70 - 99 mg/dL   Comment 1 Notify RN    Comment 2 Document in Chart    Recent Results (from the past 240 hour(s))  SARS CORONAVIRUS 2 (TAT 6-24 HRS) Nasopharyngeal Nasopharyngeal Swab     Status: None   Collection Time: 12/29/19  1:37 PM   Specimen: Nasopharyngeal Swab  Result Value Ref Range Status   SARS Coronavirus 2 NEGATIVE NEGATIVE Final    Comment: (NOTE) SARS-CoV-2 target nucleic acids are NOT DETECTED.  The SARS-CoV-2 RNA is generally detectable in upper and lower respiratory specimens during the acute phase of infection. Negative results do not preclude SARS-CoV-2 infection, do not rule out co-infections with other pathogens, and should not be used as the sole basis for treatment or other patient management decisions. Negative results must be combined with clinical observations, patient history, and epidemiological information. The expected result is Negative.  Fact Sheet for Patients: SugarRoll.be  Fact Sheet for Healthcare Providers: https://www.woods-mathews.com/  This test is not yet approved or cleared by the [place] FDA and  has been authorized for detection and/or diagnosis of SARS-CoV-2 by FDA under an Emergency Use Authorization (EUA). This EUA will remain  in effect (meaning this test can be used) for the duration of the COVID-19 declaration under Se ction 564(b)(1) of the Act, 21 U.S.C. section 360bbb-3(b)(1), unless the authorization is terminated or revoked sooner.  Performed at [facility], [place] [address], [place], [place] [address]     Renal Function: Recent Labs    12/27/19 1045  CREATININE 1.31*   Estimated Creatinine Clearance: 60 mL/min (A) (by C-G formula based on SCr of 1.31 mg/dL (H)).  Radiologic Imaging: No results found.  I independently reviewed the above imaging studies.  Impression/Recommendation 69 year old male with diverticular disease  with possible colovesical fistula  The risk, benefits and alternatives of cystoscopy with bilateral ureteral firefly injections to aid in ureteral identification during partial colectomy with Dr. Marcello Moores was discussed with the patient.  Risk include, but are not limited to, bleeding, urinary tract infection, ureteral stricture formation, pain, MI, CVA, DVT and the inherent risk of general anesthesia.  He voices understanding and wishes to proceed.  Ellison Hughs, MD [facility] 01/02/2020, 8:07 AM

## 2020-01-02 NOTE — Op Note (Signed)
Operative Note  Preoperative diagnosis:  1.  Diverticular disease with possible colovesical fistula  Postoperative diagnosis: 1.  Diverticular disease with possible colovesical fistula  Procedure(s): 1.  Cystoscopy with bilateral ureteral firefly injections  Surgeon: Ellison Hughs, MD  Assistants:  None  Anesthesia:  General  Complications:  None  EBL: Less than 5 mL  Specimens: 1.  None  Drains/Catheters: 1.  16 French Foley catheter  Intraoperative findings:   1. Erythematous area of bladder mucosa seen at the bladder dome.  Significant amount of debris in the urine.  No other intravesical abnormalities were noted.  Indication:  Alexander Foster is a 69 y.o. male with diverticular disease with possible colovesical fistula who is undergoing a partial colectomy with Dr. Marcello Moores today.  Urology has been consulted to inject firefly into both ureters to aid in ureteral identification during the dissection.  He has been consented for the above procedures, voices understanding and wishes to proceed.  Description of procedure: After informed consent was obtained, the patient was brought to the operating room and general endotracheal anesthesia was administered. The patient was then placed in the dorsolithotomy position and prepped and draped in usual sterile fashion. A timeout was performed. A 21 French rigid cystoscope was then inserted into the urethral meatus and advanced into the bladder under direct vision. A complete bladder survey revealed a slightly erythematous area involving the bladder dome, concerning for colovesical fistula.   A 6 [place] open-ended catheter was then used to intubate the right ureteral orifice and a total of 7.5 mL of firefly solution diluted with 10 mL of saline was injected into the right collecting system. A similar maneuver was then carried out on the left with the same volume and concentration of firefly. The rigid cystoscope was then removed under  direct vision. A 16 French Foley catheter was then inserted and placed to gravity drainage. The patient tolerated the procedure well. Dr. Marcello Moores then proceeded with their portion of the case  Plan:  Foley removal is at the discretion of the primary team.

## 2020-01-02 NOTE — H&P (Signed)
The patient is a 69 year old male who presents with diverticulitis. 69 year old male who presented to the office for evaluation of diverticulitis following recent hospitalization in April 2021. He was noted to have severe inflammation of his mid sigmoid colon. There was concern for intramural abscess, but no extramural findings. Patient's symptoms have much resolved. He has occasional left lower quadrant twinges but no pain and he is having bowel function without difficulty. He is on a low fiber diet. He recently underwent a colonoscopy which was normal except for diverticular stricture. He is also being evaluated by urology for frequent UTIs. There is concern on CT scan for possible colovesicular fistula. He is scheduled to undergo CT cystography later this month. He reports occasional bloody urine. He has not had air in his urine for quite some time. He denies any pelvic pain.   Problem List/Past Medical Leighton Ruff, MD; 24/82/5003 11:51 AM) DIVERTICULITIS [phone])  Past Surgical History Leighton Ruff, MD; 89/16/9450 11:51 AM) Colon Polyp Removal - Colonoscopy Tonsillectomy  Diagnostic Studies History Leighton Ruff, MD; 38/88/2800 11:51 AM) Colonoscopy 1-5 years ago  Allergies (Tanisha A. Owens [place]ark, [place]; 11/05/2019 11:27 AM) No Known Drug Allergies [06/12/2019]: Allergies Reconciled  Medication History Leighton Ruff, MD; 34/91/7915 11:51 AM) Aspirin (81MG  Tablet, Oral) Active. metFORMIN HCl (1000MG  Tablet, Oral) Active. Niacin (500MG  Tablet, Oral) Active. Flomax (0.4MG  Capsule, Oral) Active. Olmesartan Medoxomil-HCTZ (20-12.5MG  Tablet, Oral) Active. Olmesartan-amLODIPine-HCTZ (20-5-12.5MG  Tablet, Oral) Active. Singulair (10MG  Tablet, Oral) Active.   Social History Leighton Ruff, MD; 05/69/7948 11:51 AM) Alcohol use Occasional alcohol use. Caffeine use Carbonated beverages, Coffee. Illicit drug use Remotely quit drug use. Tobacco use Never  smoker.  Family History Leighton Ruff, MD; 01/65/5374 11:51 AM) Heart Disease Brother, Mother. Heart disease in male family member before age 53 Heart disease in male family member before age 72  Other Problems Leighton Ruff, MD; 82/70/7867 11:51 AM) Diabetes Mellitus Diverticulosis     Review of Systems General Not Present- Appetite Loss, Chills, Fatigue, Fever, Night Sweats, Weight Gain and Weight Loss. Skin Not Present- Change in Wart/Mole, Dryness, Hives, Jaundice, New Lesions, Non-Healing Wounds, Rash and Ulcer. HEENT Present- Wears glasses/contact lenses. Not Present- Earache, Hearing Loss, Hoarseness, Nose Bleed, Oral Ulcers, Ringing in the Ears, Seasonal Allergies, Sinus Pain, Sore Throat, Visual Disturbances and Yellow Eyes. Respiratory Not Present- Bloody sputum, Chronic Cough, Difficulty Breathing, Snoring and Wheezing. Breast Not Present- Breast Mass, Breast Pain, Nipple Discharge and Skin Changes. Cardiovascular Not Present- Chest Pain, Difficulty Breathing Lying Down, Leg Cramps, Palpitations, Rapid Heart Rate, Shortness of Breath and Swelling of Extremities. Gastrointestinal Not Present- Abdominal Pain, Bloating, Bloody Stool, Change in Bowel Habits, Chronic diarrhea, Constipation, Difficulty Swallowing, Excessive gas, Gets full quickly at meals, Hemorrhoids, Indigestion, Nausea, Rectal Pain and Vomiting. Male Genitourinary Not Present- Blood in Urine, Change in Urinary Stream, Frequency, Impotence, Nocturia, Painful Urination, Urgency and Urine Leakage. Musculoskeletal Not Present- Back Pain, Joint Pain, Joint Stiffness, Muscle Pain, Muscle Weakness and Swelling of Extremities. Neurological Not Present- Decreased Memory, Fainting, Headaches, Numbness, Seizures, Tingling, Tremor, Trouble walking and Weakness. Psychiatric Not Present- Anxiety, Bipolar, Change in Sleep Pattern, Depression, Fearful and Frequent crying. Endocrine Not Present- Cold Intolerance,  Excessive Hunger, Hair Changes, Heat Intolerance and New Diabetes. Hematology Not Present- Blood Thinners, Easy Bruising, Excessive bleeding, Gland problems, HIV and Persistent Infections.  BP 104/68   Pulse 79   Temp 98.5 F (36.9 C) (Oral)   Resp 18   Ht 5\' 10"  (1.778 m)   Wt 92.5 kg   SpO2  100%   BMI 29.27 kg/m    Physical Exam  General Mental Status-Alert. General Appearance-Cooperative. CV: RRR Lungs: CTA Abdomen Palpation/Percussion Palpation and Percussion of the abdomen reveal - Soft and Non Tender.    Assessment & Plan  DIVERTICULITIS (S28.76) Impression: 69 year old male who presents to the office for evaluation of diverticular disease with stricture and possible colovesicular fistula. I have recommended robotic-assisted surgical resection of the sigmoid colon and possible repair of bladder fistula. He is scheduled for a CT cystogram later this month.  ssed this in detail. I would like to have urology inject firefly prior to the surgery.  We will obtain the records for this and plan her surgery accordingly. We have discussed his surgery in detail, including risks, benefits and alternatives.   All questions were answered. The surgery and anatomy were described to the patient as well as the risks of surgery and the possible complications. These include: Bleeding, deep abdominal infections and possible wound complications such as hernia and infection, damage to adjacent structures, leak of surgical connections, which can lead to other surgeries and possibly an ostomy, possible need for other procedures, such as abscess drains in radiology, possible prolonged hospital stay, possible diarrhea from removal of part of the colon, possible constipation from narcotics, possible bowel, bladder or sexual dysfunction if having rectal surgery, prolonged fatigue/weakness or appetite loss, possible early recurrence of of disease, possible complications of their medical problems  such as heart disease or arrhythmias or lung problems, death (less than 1%). I believe the patient understands and wishes to proceed with the surgery.

## 2020-01-02 NOTE — Transfer of Care (Signed)
Immediate Anesthesia Transfer of Care Note  Patient: Alexander Foster  Procedure(s) Performed: XI ROBOT ASSISTED SIGMOIDECTOMY, RIGID PROCTOSCOPY (N/A Abdomen) CYSTOSCOPY WITH FIREFLY INJECTION (Bilateral )  Patient Location: PACU  Anesthesia Type:General  Level of Consciousness: sedated, patient cooperative and responds to stimulation  Airway & Oxygen Therapy: Patient Spontanous Breathing and Patient connected to face mask oxygen  Post-op Assessment: Report given to RN and Post -op Vital signs reviewed and stable  Post vital signs: Reviewed and stable  Last Vitals:  Vitals Value Taken Time  BP 136/74 01/02/20 1237  Temp    Pulse    Resp    SpO2    Vitals shown include unvalidated device data.  Last Pain:  Vitals:   01/02/20 0714  TempSrc: Oral  PainSc:       Patients Stated Pain Goal: 4 (73/73/66 8159)  Complications: No complications documented.

## 2020-01-02 NOTE — Op Note (Signed)
01/02/2020  12:18 PM  PATIENT:  Alexander Foster  69 y.o. male  Patient Care Team: Lucianne Lei, MD as PCP - General (Family Medicine) Lucianne Lei, MD as Referring Physician (Family Medicine)  PRE-OPERATIVE DIAGNOSIS:  CHRONIC DIVERTICULITIS  POST-OPERATIVE DIAGNOSIS:  CHRONIC DIVERTICULITIS  PROCEDURE:  XI ROBOT ASSISTED SIGMOIDECTOMY, INTRAOPERATIVE ASSESSMENT OF PERFUSION, SPLENIC FLEXURE MOBILIZATION, RIGID PROCTOSCOPY CYSTOSCOPY WITH FIREFLY INJECTION    Surgeon(s): Leighton Ruff, MD Carlena Hurl, PA-C Lovena Neighbours Conception Oms, MD  ASSISTANT: Carlena Hurl, PA   ANESTHESIA:   local and general  EBL: 172ml  Total I/O In: 1850 [I.V.:1500; IV Piggyback:350] Out: 900 [Urine:800; Blood:100]  Delay start of Pharmacological VTE agent (>24hrs) due to surgical blood loss or risk of bleeding:  no  DRAINS: none   SPECIMEN:  Source of Specimen:  Sigmoid colon  DISPOSITION OF SPECIMEN:  PATHOLOGY  COUNTS:  YES  PLAN OF CARE: Admit to inpatient   PATIENT DISPOSITION:  PACU - hemodynamically stable.  INDICATION:    Patient with colovesical fistula clinically.  I recommended segmental resection:  The anatomy & physiology of the digestive tract was discussed.  The pathophysiology was discussed.  Natural history risks without surgery was discussed.   I worked to give an overview of the disease and the frequent need to have multispecialty involvement.  I feel the risks of no intervention will lead to serious problems that outweigh the operative risks; therefore, I recommended a partial colectomy to remove the pathology.  Laparoscopic & open techniques were discussed.   Risks such as bleeding, infection, abscess, leak, reoperation, possible ostomy, hernia, heart attack, death, and other risks were discussed.  I noted a good likelihood this will help address the problem.   Goals of post-operative recovery were discussed as well.    The patient expressed understanding & wished to  proceed with surgery.  OR FINDINGS:   Patient had significant inflammation and a abdominal wall abscess with active infection.    The anastomosis rests 15 cm from the anal verge by rigid proctoscopy.  DESCRIPTION:   Informed consent was confirmed.  The patient underwent general anaesthesia without difficulty.  The patient was positioned appropriately.  VTE prevention in place.  The patient's abdomen was clipped, prepped, & draped in a sterile fashion.  Surgical timeout confirmed our plan.  The patient was positioned in reverse Trendelenburg.  Abdominal entry was gained using a Varies needle.  Entry was clean.  I induced carbon dioxide insufflation.  An 51mm robotic port was placed in the RUQ.  Camera inspection revealed no injury.  Extra ports were carefully placed under direct laparoscopic visualization.  I laparoscopically reflected the greater omentum and the upper abdomen the small bowel in the upper abdomen. The patient was appropriately positioned and the robot was docked to the patient's left side.  Instruments were placed under direct visualization.    I mobilized the sigmoid colon off of the pelvic sidewall. There was significant inflammation in the left lower abdominal wall tethering the colon.   There was a section of small bowel that was tethered to the colon as well.  This was carefully taken down using blunt dissection and sharp dissection.  The small bowel was inspected for any sign of injury.  There was none noted.  This was then reflected into the upper abdomen and I continued to mobilize the colon off of the abdominal wall.  I was not able to get all the way through the abdominal wall attachments and therefore I turned my  attention to the pedicle.  I scored the base of peritoneum of the right side of the mesentery of the left colon from the ligament of Treitz to the peritoneal reflection of the mid rectum.   I elevated the sigmoid mesentery and enetered into the retro-mesenteric plane.  We were able to identify the left ureter and gonadal vessels. We kept those posterior within the retroperitoneum and elevated the left colon mesentery off that. I did isolated IMA pedicle but did not ligate it yet.  I continued distally and got into the avascular plane posterior to the mesorectum. This allowed me to help mobilize the rectum as well by freeing the mesorectum off the sacrum.  I mobilized the peritoneal coverings towards the peritoneal reflection on both the right and left sides of the rectum.  I could see the right and left ureters and stayed away from them.    I skeletonized the inferior mesenteric artery pedicle.    After confirming the left ureter was out of the way, I divided the hemorrhoidal artery using the robotic vessel sealer.  I then divided the inferior mesenteric artery well above the takeoff of the aorta.  I bluntly dissected the mesenteric and retroperitoneal planes.  We ensured hemostasis. I skeletonized the mesorectum at the junction at the proximal rectum using blunt dissection & bipolar robotic vessel sealer.  I then turned my attention back to the abdominal wall attachments.  These were divided using the robotic vessel sealer.  An abscess cavity was encountered which was approximately 5 cm and purulence was suctioned out of this abscess cavity.  There was debris as well.  This was then opened further and irrigated well.  The remaining colon attachments were taken down using the robotic vessel sealer.  I mobilized the left colon in a lateral to medial fashion off the line of Toldt up towards the splenic flexure,, but this was not enough mobilization to get the colon to reach into the pelvis.  I reflected the small bowel and omentum into the upper abdomen and dissected out the splenic flexure using the robotic vessel sealer.  The omentum was taken down and the entire flexure was taken down after that.  Hemostasis was good.  This allowed the colon to mobilize into the pelvis.  I  divided the mesentery proximal to the vascular pedicle using robotic vessel sealer up to an area of normal-appearing colon.  At this point 5 mL of firefly was injected intravenously to assess for intraoperative perfusion.  There was good perfusion noted to the sigmoid colon down to the dissected area in the mesentery.  There was good perfusion noted in the rectum as well.  I evaluated the left and the right ureter.  They were both intact and deep into the pelvis.  We then switched to the 12 mm port and placed a green load robotic stapler into the abdomen.  The rectosigmoid junction was transected using the green load stapler.  The robot was then undocked.  The 12 mm suprapubic port was enlarge into a Pfannenstiel incision.  An [place] wound protector was placed.  The colon was removed from the abdomen and transected at the previously dissected area of the descending colon over a pursestring device.  A 2-0 Prolene pursestring was placed.  This was secured with interrupted 3-0 silk sutures.  The 29 mm EEA anvil was inserted into the colon and the pursestring was tied tightly around this  A 29 mm EEA anvil stapler was then inserted into the  rectum and the spike was brought out through the distal edge of the rectal stump.  An anastomosis was created under direct visualization.  I then placed the cap on the Mitchell and laparoscopically evaluated the abdomen for hemostasis.  There was no sign of active bleeding.  The abdomen was irrigated with 2 L of warm normal saline.  We tested the anastomosis using insufflation under irrigation.  There was no leak noted.  There was no tension on the anastomosis.  There was no injury noted to the small bowel as well.  The omentum was then brought down over the abdominal contents and the ports and laparoscopic equipment were removed.  The abdomen was desufflated and the Alexis port was removed.  We switched to clean gowns, gloves, instruments and drapes.  The peritoneum of the  Pfannenstiel incision was closed using a running 0 Vicryl suture.  The fascia was closed using interrupted #1 Novafil sutures.  The subcutaneous tissue was reapproximated using a running 2-0 Vicryl suture.  The skin was closed using a running 4-0 Vicryl suture.  A sterile dressing was applied.  The remaining port sites were also closed using interrupted 4-0 Vicryl sutures and Dermabond.  The patient was then awakened from anesthesia and sent to the postanesthesia care unit in stable condition.  All counts were correct per operating room staff.  A PA assistant was necessary for tissue manipulation, retraction and positioning due to the complexity of the case and hospital policies

## 2020-01-03 ENCOUNTER — Encounter (HOSPITAL_COMMUNITY): Payer: Self-pay | Admitting: General Surgery

## 2020-01-03 LAB — GLUCOSE, CAPILLARY
Glucose-Capillary: 150 mg/dL — ABNORMAL HIGH (ref 70–99)
Glucose-Capillary: 132 mg/dL — ABNORMAL HIGH (ref 70–99)
Glucose-Capillary: 152 mg/dL — ABNORMAL HIGH (ref 70–99)
Glucose-Capillary: 137 mg/dL — ABNORMAL HIGH (ref 70–99)

## 2020-01-03 LAB — BASIC METABOLIC PANEL
Anion gap: 10 (ref 5–15)
BUN: 15 mg/dL (ref 8–23)
CO2: 24 mmol/L (ref 22–32)
Calcium: 8.2 mg/dL — ABNORMAL LOW (ref 8.9–10.3)
Chloride: 103 mmol/L (ref 98–111)
Creatinine, Ser: 1.51 mg/dL — ABNORMAL HIGH (ref 0.61–1.24)
GFR, Estimated: 49 mL/min — ABNORMAL LOW (ref >60)
Glucose, Bld: 177 mg/dL — ABNORMAL HIGH (ref 70–99)
Potassium: 4 mmol/L (ref 3.5–5.1)
Sodium: 137 mmol/L (ref 135–145)

## 2020-01-03 LAB — CBC
HCT: 31.1 % — ABNORMAL LOW (ref 39.0–52.0)
Hemoglobin: 10.4 g/dL — ABNORMAL LOW (ref 13.0–17.0)
MCH: 26.7 pg (ref 26.0–34.0)
MCHC: 33.4 g/dL (ref 30.0–36.0)
MCV: 79.9 fL — ABNORMAL LOW (ref 80.0–100.0)
Platelets: 267 10*3/uL (ref 150–400)
RBC: 3.89 MIL/uL — ABNORMAL LOW (ref 4.22–5.81)
RDW: 13.9 % (ref 11.5–15.5)
WBC: 7.1 10*3/uL (ref 4.0–10.5)
nRBC: 0 % (ref 0.0–0.2)

## 2020-01-03 MED ORDER — SODIUM CHLORIDE 0.9 % IV BOLUS
1000.0000 mL | Freq: Once | INTRAVENOUS | Status: AC
  Administered 2020-01-03: 15:00:00 1000 mL via INTRAVENOUS

## 2020-01-03 MED ORDER — CHLORHEXIDINE GLUCONATE CLOTH 2 % EX PADS
6.0000 | MEDICATED_PAD | Freq: Every day | CUTANEOUS | Status: DC
  Administered 2020-01-03: 6 via TOPICAL

## 2020-01-03 MED ORDER — PNEUMOCOCCAL VAC POLYVALENT 25 MCG/0.5ML IJ INJ
0.5000 mL | INJECTION | INTRAMUSCULAR | Status: DC
  Filled 2020-01-03: qty 0.5

## 2020-01-03 MED ORDER — INFLUENZA VAC A&B SA ADJ QUAD 0.5 ML IM PRSY
0.5000 mL | PREFILLED_SYRINGE | INTRAMUSCULAR | Status: DC
  Filled 2020-01-03: qty 0.5

## 2020-01-03 NOTE — Plan of Care (Signed)

## 2020-01-03 NOTE — Progress Notes (Signed)
Pt ambulated with front wheel walker in hallway 100 ft. Pt tolerated well.  Pt belching/burping multiple times. Small BM slightly blood tinged. Day shift nurse made aware. Pt with freestyle Libre CBG check disc on RUA and meter at the bedside. Pt tolerating fluids, foley intact per order to remove post op day #2.  Surgical site and [facility] dressing C/D/I. SCD's off for 30 mins and pt with minimal discomfort at this time. Call light and phone in reach. Bed exit alarm maintained.

## 2020-01-03 NOTE — Progress Notes (Signed)
1 Day Post-Op Robotic Sigmoidectomy Subjective: No nausea, having some pain with movements.  Having BM's.  Ambulated this AM  Objective: Vital signs in last 24 hours: Temp:  [97.5 F (36.4 C)-98.8 F (37.1 C)] 98.8 F (37.1 C) (12/09 0536) Pulse Rate:  [64-109] 104 (12/09 0536) Resp:  [8-18] 18 (12/09 0536) BP: (124-159)/(67-86) 126/73 (12/09 0536) SpO2:  [99 %-100 %] 100 % (12/09 0536)   Intake/Output from previous day: 12/08 0701 - 12/09 0700 In: 4973.4 [P.O.:960; I.V.:3663.4; IV Piggyback:350] Out: 2876 [Urine:4875; Blood:100] Intake/Output this shift: No intake/output data recorded.   General appearance: alert and cooperative GI: normal findings: soft, non-tender  Incision: no significant drainage  Lab Results:  Recent Labs    01/03/20 0444  WBC 7.1  HGB 10.4*  HCT 31.1*  PLT 267   BMET Recent Labs    01/03/20 0444  NA 137  K 4.0  CL 103  CO2 24  GLUCOSE 177*  BUN 15  CREATININE 1.51*  CALCIUM 8.2*   PT/INR No results for input(s): LABPROT, INR in the last 72 hours. ABG No results for input(s): PHART, HCO3 in the last 72 hours.  Invalid input(s): PCO2, PO2  MEDS, Scheduled . acetaminophen  1,000 mg Oral Q6H  . alvimopan  12 mg Oral BID  . irbesartan  150 mg Oral Daily   And  . amLODipine  5 mg Oral Daily   And  . hydrochlorothiazide  12.5 mg Oral Daily  . Chlorhexidine Gluconate Cloth  6 each Topical Daily  . enoxaparin (LOVENOX) injection  40 mg Subcutaneous Q24H  . feeding supplement  237 mL Oral BID BM  . gabapentin  300 mg Oral BID  . insulin aspart  0-20 Units Subcutaneous TID WC  . insulin aspart  0-5 Units Subcutaneous QHS  . insulin glargine  40 Units Subcutaneous Daily  . liraglutide  1.8 mg Subcutaneous Daily  . [START ON 01/04/2020] metFORMIN  1,000 mg Oral BID WC  . montelukast  10 mg Oral QHS  . saccharomyces boulardii  250 mg Oral BID  . tamsulosin  0.4 mg Oral QHS  . Testosterone  2 Pump Topical Daily     Studies/Results: No results found.  Assessment: s/p Procedure(s): XI ROBOT ASSISTED SIGMOIDECTOMY, RIGID PROCTOSCOPY CYSTOSCOPY WITH FIREFLY INJECTION Patient Active Problem List   Diagnosis Date Noted  . Diverticular disease 01/02/2020  . Diverticulitis of intestine with abscess 05/11/2019  . Diverticulitis of large intestine with perforation and abscess without bleeding   . Sepsis ([place])   . Renal insufficiency   . Diverticulitis 09/03/2017  . Deficiency anemia 05/08/2013    Expected post op course  Plan: Advance diet as tolerated Cont MIV Ambulate in hall   LOS: 1 day     .Rosario Adie, MD [facility] Surgery, [place]    01/03/2020 8:22 AM

## 2020-01-03 NOTE — Anesthesia Postprocedure Evaluation (Signed)
Anesthesia Post Note  Patient: KIVON APREA  Procedure(s) Performed: XI ROBOT ASSISTED SIGMOIDECTOMY, RIGID PROCTOSCOPY (N/A Abdomen) CYSTOSCOPY WITH FIREFLY INJECTION (Bilateral )     Patient location during evaluation: PACU Anesthesia Type: General Level of consciousness: awake and alert Pain management: pain level controlled Vital Signs Assessment: post-procedure vital signs reviewed and stable Respiratory status: spontaneous breathing, nonlabored ventilation, respiratory function stable and patient connected to nasal cannula oxygen Cardiovascular status: blood pressure returned to baseline and stable Postop Assessment: no apparent nausea or vomiting Anesthetic complications: no   No complications documented.  Last Vitals:  Vitals:   01/03/20 0536 01/03/20 1034  BP: 126/73 (!) 86/58  Pulse: (!) 104 (!) 106  Resp: 18 17  Temp: 37.1 C 36.8 C  SpO2: 100% 99%    Last Pain:  Vitals:   01/03/20 1034  TempSrc: Oral  PainSc: 0-No pain                 Daviana Haymaker L Cavon Nicolls

## 2020-01-04 ENCOUNTER — Other Ambulatory Visit (HOSPITAL_COMMUNITY): Payer: Self-pay | Admitting: General Surgery

## 2020-01-04 LAB — BASIC METABOLIC PANEL
Anion gap: 8 (ref 5–15)
BUN: 16 mg/dL (ref 8–23)
CO2: 24 mmol/L (ref 22–32)
Calcium: 8.2 mg/dL — ABNORMAL LOW (ref 8.9–10.3)
Chloride: 107 mmol/L (ref 98–111)
Creatinine, Ser: 1.55 mg/dL — ABNORMAL HIGH (ref 0.61–1.24)
GFR, Estimated: 48 mL/min — ABNORMAL LOW (ref >60)
Glucose, Bld: 111 mg/dL — ABNORMAL HIGH (ref 70–99)
Potassium: 3.7 mmol/L (ref 3.5–5.1)
Sodium: 139 mmol/L (ref 135–145)

## 2020-01-04 LAB — CBC
HCT: 27.7 % — ABNORMAL LOW (ref 39.0–52.0)
Hemoglobin: 9.2 g/dL — ABNORMAL LOW (ref 13.0–17.0)
MCH: 27 pg (ref 26.0–34.0)
MCHC: 33.2 g/dL (ref 30.0–36.0)
MCV: 81.2 fL (ref 80.0–100.0)
Platelets: 228 10*3/uL (ref 150–400)
RBC: 3.41 MIL/uL — ABNORMAL LOW (ref 4.22–5.81)
RDW: 14.6 % (ref 11.5–15.5)
WBC: 11.2 10*3/uL — ABNORMAL HIGH (ref 4.0–10.5)
nRBC: 0 % (ref 0.0–0.2)

## 2020-01-04 LAB — GLUCOSE, CAPILLARY
Glucose-Capillary: 113 mg/dL — ABNORMAL HIGH (ref 70–99)
Glucose-Capillary: 181 mg/dL — ABNORMAL HIGH (ref 70–99)
Glucose-Capillary: 166 mg/dL — ABNORMAL HIGH (ref 70–99)
Glucose-Capillary: 125 mg/dL — ABNORMAL HIGH (ref 70–99)

## 2020-01-04 LAB — SURGICAL PATHOLOGY

## 2020-01-04 MED ORDER — OXYCODONE HCL 5 MG PO TABS
5.0000 mg | ORAL_TABLET | Freq: Four times a day (QID) | ORAL | 0 refills | Status: DC | PRN
Start: 2020-01-04 — End: 2020-01-04

## 2020-01-04 MED ORDER — TESTOSTERONE 20.25 MG/ACT (1.62%) TD GEL: 2.0000 | Freq: Every day | TRANSDERMAL | Status: DC

## 2020-01-04 MED ORDER — TRIBENZOR 20-5-12.5 MG PO TABS: 1.0000 | ORAL_TABLET | Freq: Every day | ORAL | 2 refills | Status: AC

## 2020-01-04 MED ORDER — OXYCODONE HCL 5 MG PO TABS
5.0000 mg | ORAL_TABLET | ORAL | Status: DC | PRN
  Administered 2020-01-04: 23:00:00 10 mg via ORAL
  Administered 2020-01-04: 5 mg via ORAL
  Administered 2020-01-04: 15:00:00 10 mg via ORAL
  Administered 2020-01-04: 08:00:00 5 mg via ORAL
  Administered 2020-01-05 (×2): 10 mg via ORAL
  Filled 2020-01-04 (×2): qty 2
  Filled 2020-01-04: qty 1
  Filled 2020-01-04: qty 2
  Filled 2020-01-04: qty 1
  Filled 2020-01-04: qty 2

## 2020-01-04 MED ORDER — TESTOSTERONE 50 MG/5GM (1%) TD GEL: 5.0000 g | Freq: Every day | TRANSDERMAL | Status: DC

## 2020-01-04 MED FILL — oxyCODONE HCL 5 MG TABS: 5 | 7 days supply | Qty: 30 | Fill #0

## 2020-01-04 NOTE — Progress Notes (Signed)
2 Days Post-Op Robotic Sigmoidectomy Subjective: No nausea, having some pain with movements.  Having BM's.  Ambulated this AM.  Had some hypotension after home BP meds restarted yesterday.  BP responded well to IVF's  Objective: Vital signs in last 24 hours: Temp:  [97.9 F (36.6 C)-98.7 F (37.1 C)] 98.4 F (36.9 C) (12/10 0646) Pulse Rate:  [92-108] 106 (12/10 0646) Resp:  [12-18] 18 (12/10 0646) BP: (80-128)/(56-74) 128/74 (12/10 0646) SpO2:  [98 %-100 %] 100 % (12/10 0646)   Intake/Output from previous day: 12/09 0701 - 12/10 0700 In: 2149 [P.O.:720; I.V.:429; IV Piggyback:1000] Out: 5956 [Urine:3550; Stool:1] Intake/Output this shift: No intake/output data recorded.   General appearance: alert and cooperative GI: normal findings: soft, non-tender  Incision: no significant drainage  Lab Results:  Recent Labs    01/03/20 0444 01/04/20 0548  WBC 7.1 11.2*  HGB 10.4* 9.2*  HCT 31.1* 27.7*  PLT 267 228   BMET Recent Labs    01/03/20 0444 01/04/20 0548  NA 137 139  K 4.0 3.7  CL 103 107  CO2 24 24  GLUCOSE 177* 111*  BUN 15 16  CREATININE 1.51* 1.55*  CALCIUM 8.2* 8.2*   PT/INR No results for input(s): LABPROT, INR in the last 72 hours. ABG No results for input(s): PHART, HCO3 in the last 72 hours.  Invalid input(s): PCO2, PO2  MEDS, Scheduled . acetaminophen  1,000 mg Oral Q6H  . Chlorhexidine Gluconate Cloth  6 each Topical Daily  . enoxaparin (LOVENOX) injection  40 mg Subcutaneous Q24H  . feeding supplement  237 mL Oral BID BM  . gabapentin  300 mg Oral BID  . insulin aspart  0-20 Units Subcutaneous TID WC  . insulin aspart  0-5 Units Subcutaneous QHS  . insulin glargine  40 Units Subcutaneous Daily  . liraglutide  1.8 mg Subcutaneous Daily  . metFORMIN  1,000 mg Oral BID WC  . montelukast  10 mg Oral QHS  . saccharomyces boulardii  250 mg Oral BID  . tamsulosin  0.4 mg Oral QHS  . Testosterone  2 Pump Topical Daily     Studies/Results: No results found.  Assessment: s/p Procedure(s): XI ROBOT ASSISTED SIGMOIDECTOMY, RIGID PROCTOSCOPY CYSTOSCOPY WITH FIREFLY INJECTION Patient Active Problem List   Diagnosis Date Noted  . Diverticular disease 01/02/2020  . Diverticulitis of intestine with abscess 05/11/2019  . Diverticulitis of large intestine with perforation and abscess without bleeding   . Sepsis ([place])   . Renal insufficiency   . Diverticulitis 09/03/2017  . Deficiency anemia 05/08/2013    Expected post op course  Plan: Cont Soft diet SL MIV Ambulate in hall Cont to hold BP meds for at least 1 wk  LOS: 2 days     .Rosario Adie, MD [facility] Surgery, [place]    01/04/2020 7:49 AM

## 2020-01-04 NOTE — Discharge Instructions (Signed)

## 2020-01-05 LAB — CBC
HCT: 26.2 % — ABNORMAL LOW (ref 39.0–52.0)
Hemoglobin: 8.8 g/dL — ABNORMAL LOW (ref 13.0–17.0)
MCH: 27.5 pg (ref 26.0–34.0)
MCHC: 33.6 g/dL (ref 30.0–36.0)
MCV: 81.9 fL (ref 80.0–100.0)
Platelets: 233 10*3/uL (ref 150–400)
RBC: 3.2 MIL/uL — ABNORMAL LOW (ref 4.22–5.81)
RDW: 14.8 % (ref 11.5–15.5)
WBC: 9.3 10*3/uL (ref 4.0–10.5)
nRBC: 0 % (ref 0.0–0.2)

## 2020-01-05 LAB — BASIC METABOLIC PANEL
Anion gap: 9 (ref 5–15)
BUN: 19 mg/dL (ref 8–23)
CO2: 22 mmol/L (ref 22–32)
Calcium: 8 mg/dL — ABNORMAL LOW (ref 8.9–10.3)
Chloride: 108 mmol/L (ref 98–111)
Creatinine, Ser: 1.55 mg/dL — ABNORMAL HIGH (ref 0.61–1.24)
GFR, Estimated: 48 mL/min — ABNORMAL LOW (ref >60)
Glucose, Bld: 105 mg/dL — ABNORMAL HIGH (ref 70–99)
Potassium: 3.5 mmol/L (ref 3.5–5.1)
Sodium: 139 mmol/L (ref 135–145)

## 2020-01-05 LAB — GLUCOSE, CAPILLARY: Glucose-Capillary: 89 mg/dL (ref 70–99)

## 2020-01-05 NOTE — Plan of Care (Signed)

## 2020-01-05 NOTE — Progress Notes (Signed)
Discharged via wc to front entrance accompanied by wife and NT. Pt and wife verbalized understanding of dc instructions through teach back including medications to resume, reasons to call doctor, and follow up care. Assessment unchanged.

## 2020-01-08 NOTE — Discharge Summary (Signed)
Patient ID: Alexander Foster [id number] 69 y.o. 10-24-1950  01/02/2020  Discharge date and time: 01/05/2020 11:19 AM  Admitting Physician: Rosario Adie  Discharge Physician: CCS, MD  Admission Diagnoses: Diverticular disease [K57.90]  Discharge Diagnoses: Diverticular disease  Operations:  XI ROBOT ASSISTED SIGMOIDECTOMY, RIGID PROCTOSCOPY CYSTOSCOPY WITH FIREFLY INJECTION    Discharged Condition: good    Hospital Course: Pt admitted after surgery.  Diet advanced as tolerated.  Foley removed on POD 2.  Ready for d/c on POD 3.    Consults: None  Significant Diagnostic Studies: labs: cbc, bmet  Treatments: IV hydration, analgesia: acetaminophen and surgery: robotic partial colectomy  Disposition: Home

## 2020-05-07 DIAGNOSIS — E785 Hyperlipidemia, unspecified: Secondary | ICD-10-CM | POA: Diagnosis not present

## 2020-05-07 DIAGNOSIS — I10 Essential (primary) hypertension: Secondary | ICD-10-CM | POA: Diagnosis not present

## 2020-05-07 DIAGNOSIS — E1169 Type 2 diabetes mellitus with other specified complication: Secondary | ICD-10-CM | POA: Diagnosis not present

## 2020-05-08 DIAGNOSIS — D508 Other iron deficiency anemias: Secondary | ICD-10-CM | POA: Diagnosis not present

## 2020-05-08 DIAGNOSIS — N189 Chronic kidney disease, unspecified: Secondary | ICD-10-CM | POA: Diagnosis not present

## 2020-05-08 DIAGNOSIS — Z0000 Encounter for general adult medical examination without abnormal findings: Secondary | ICD-10-CM | POA: Diagnosis not present

## 2020-05-08 DIAGNOSIS — I480 Paroxysmal atrial fibrillation: Secondary | ICD-10-CM | POA: Diagnosis not present

## 2020-05-08 DIAGNOSIS — E782 Mixed hyperlipidemia: Secondary | ICD-10-CM | POA: Diagnosis not present

## 2020-05-08 DIAGNOSIS — E1169 Type 2 diabetes mellitus with other specified complication: Secondary | ICD-10-CM | POA: Diagnosis not present

## 2020-05-16 DIAGNOSIS — E785 Hyperlipidemia, unspecified: Secondary | ICD-10-CM | POA: Diagnosis not present

## 2020-05-16 DIAGNOSIS — I10 Essential (primary) hypertension: Secondary | ICD-10-CM | POA: Diagnosis not present

## 2020-05-16 DIAGNOSIS — E1169 Type 2 diabetes mellitus with other specified complication: Secondary | ICD-10-CM | POA: Diagnosis not present

## 2020-07-11 DIAGNOSIS — N401 Enlarged prostate with lower urinary tract symptoms: Secondary | ICD-10-CM | POA: Diagnosis not present

## 2020-07-11 DIAGNOSIS — R948 Abnormal results of function studies of other organs and systems: Secondary | ICD-10-CM | POA: Diagnosis not present

## 2020-07-11 DIAGNOSIS — N5201 Erectile dysfunction due to arterial insufficiency: Secondary | ICD-10-CM | POA: Diagnosis not present

## 2020-07-11 DIAGNOSIS — R3912 Poor urinary stream: Secondary | ICD-10-CM | POA: Diagnosis not present

## 2020-07-11 DIAGNOSIS — E291 Testicular hypofunction: Secondary | ICD-10-CM | POA: Diagnosis not present

## 2020-07-17 DIAGNOSIS — E1169 Type 2 diabetes mellitus with other specified complication: Secondary | ICD-10-CM | POA: Diagnosis not present

## 2020-07-17 DIAGNOSIS — N5201 Erectile dysfunction due to arterial insufficiency: Secondary | ICD-10-CM | POA: Diagnosis not present

## 2020-07-23 DIAGNOSIS — Z20822 Contact with and (suspected) exposure to covid-19: Secondary | ICD-10-CM | POA: Diagnosis not present

## 2020-07-23 DIAGNOSIS — U071 COVID-19: Secondary | ICD-10-CM | POA: Diagnosis not present

## 2020-08-14 DIAGNOSIS — U071 COVID-19: Secondary | ICD-10-CM | POA: Diagnosis not present

## 2020-09-08 DIAGNOSIS — Z8616 Personal history of COVID-19: Secondary | ICD-10-CM | POA: Diagnosis not present

## 2020-09-08 DIAGNOSIS — E785 Hyperlipidemia, unspecified: Secondary | ICD-10-CM | POA: Diagnosis not present

## 2020-09-08 DIAGNOSIS — I099 Rheumatic heart disease, unspecified: Secondary | ICD-10-CM | POA: Diagnosis not present

## 2020-09-08 DIAGNOSIS — E1149 Type 2 diabetes mellitus with other diabetic neurological complication: Secondary | ICD-10-CM | POA: Diagnosis not present

## 2020-09-08 DIAGNOSIS — Z720 Tobacco use: Secondary | ICD-10-CM | POA: Diagnosis not present

## 2020-09-08 DIAGNOSIS — R5383 Other fatigue: Secondary | ICD-10-CM | POA: Diagnosis not present

## 2020-09-08 DIAGNOSIS — E1169 Type 2 diabetes mellitus with other specified complication: Secondary | ICD-10-CM | POA: Diagnosis not present

## 2020-09-08 DIAGNOSIS — I10 Essential (primary) hypertension: Secondary | ICD-10-CM | POA: Diagnosis not present

## 2020-10-06 DIAGNOSIS — E1169 Type 2 diabetes mellitus with other specified complication: Secondary | ICD-10-CM | POA: Diagnosis not present

## 2020-11-10 DIAGNOSIS — K5700 Diverticulitis of small intestine with perforation and abscess without bleeding: Secondary | ICD-10-CM | POA: Diagnosis not present

## 2020-11-10 DIAGNOSIS — E1169 Type 2 diabetes mellitus with other specified complication: Secondary | ICD-10-CM | POA: Diagnosis not present

## 2020-11-10 DIAGNOSIS — E785 Hyperlipidemia, unspecified: Secondary | ICD-10-CM | POA: Diagnosis not present

## 2020-11-10 DIAGNOSIS — N189 Chronic kidney disease, unspecified: Secondary | ICD-10-CM | POA: Diagnosis not present

## 2020-11-11 DIAGNOSIS — I10 Essential (primary) hypertension: Secondary | ICD-10-CM | POA: Diagnosis not present

## 2020-11-11 DIAGNOSIS — E1169 Type 2 diabetes mellitus with other specified complication: Secondary | ICD-10-CM | POA: Diagnosis not present

## 2020-12-02 DIAGNOSIS — R799 Abnormal finding of blood chemistry, unspecified: Secondary | ICD-10-CM | POA: Diagnosis not present

## 2020-12-24 ENCOUNTER — Ambulatory Visit: Payer: Medicare PPO | Attending: Family Medicine

## 2020-12-24 ENCOUNTER — Other Ambulatory Visit: Payer: Self-pay

## 2020-12-24 VITALS — BP 138/78

## 2020-12-24 DIAGNOSIS — M6281 Muscle weakness (generalized): Secondary | ICD-10-CM | POA: Insufficient documentation

## 2020-12-24 DIAGNOSIS — R2681 Unsteadiness on feet: Secondary | ICD-10-CM | POA: Diagnosis not present

## 2020-12-24 DIAGNOSIS — R2689 Other abnormalities of gait and mobility: Secondary | ICD-10-CM | POA: Diagnosis not present

## 2020-12-24 NOTE — Therapy (Signed)
[facility] [address] [place], [place], [address] Phone: [phone]   Fax:  [phone]  Physical Therapy Evaluation  Patient Details  Name: Alexander Foster MRN: [id number] Date of Birth: 02-23-1950 Referring Provider (PT): Lucianne Lei, MD   Encounter Date: 12/24/2020   PT End of Session - 12/24/20 1708     Visit Number 1    Number of Visits 17    Date for PT Re-Evaluation 02/20/21    Authorization Type [employer] medicare so 10th visit progress note. Auth requested    PT Start Time 1620    PT Stop Time 1705    PT Time Calculation (min) 45 min    Equipment Utilized During Treatment Gait belt    Activity Tolerance Patient tolerated treatment well    Behavior During Therapy WFL for tasks assessed/performed             Past Medical History:  Diagnosis Date   Anemia    Asthma    As a child   Cancer ([place])    seed implant for prostate cancer   Diabetes mellitus without complication ([place])    Hypertension     Past Surgical History:  Procedure Laterality Date   CYSTOSCOPY WITH STENT PLACEMENT Bilateral 01/02/2020   Procedure: CYSTOSCOPY WITH FIREFLY INJECTION;  Surgeon: Ceasar Mons, MD;  Location: [facility];  Service: Urology;  Laterality: Bilateral;   PROSTATE BIOPSY     RADIOACTIVE SEED IMPLANT  2010   TONSILLECTOMY     WISDOM TOOTH EXTRACTION      Vitals:   12/24/20 1536  BP: 138/78      Subjective Assessment - 12/24/20 1536     Subjective Pt was referred by doctor to come in to find ways he can get up and moving. Pt states he has the most trouble going up steps and states having to use rail due to reporting weak knees. Pt reports having difficutly carrying laundry upstiars. Pt has a history of LBP. Pt states he has a history of shortness of breath w/ exertion.    Pertinent History PMH: diverticulitis, frequent UTIs, 01/02/20  XI ROBOT ASSISTED SIGMOIDECTOMY, RIGID PROCTOSCOPY  CYSTOSCOPY  WITH FIREFLY INJECTION, HTN, DM2, Anemia, Prostate cancer    Patient Stated Goals Improve stairs and improve overall fitness (endurance, strenghthen core)    Currently in Pain? Yes    Pain Score 2     Pain Location Hip    Pain Orientation Left    Pain Descriptors / Indicators Aching    Pain Type Chronic pain    Pain Onset More than a month ago    Pain Frequency Occasional                [facility] PT Assessment - 12/24/20 1542       Assessment   Medical Diagnosis Muscle weakness    Referring Provider (PT) Lucianne Lei, MD    Onset Date/Surgical Date 11/14/20      Balance Screen   Has the patient fallen in the past 6 months No   Pt reports he had a series of falls the last 20 years due to a syncope caused by coughing   Has the patient had a decrease in activity level because of a fear of falling?  No    Is the patient reluctant to leave their home because of a fear of falling?  No      Home Ecologist residence  Living Arrangements Spouse/significant other    Available Help at Discharge Family    Type of [place] to enter    Entrance Stairs-Number of Steps 2    Entrance Stairs-Rails None    Home Layout Two level;Laundry or work area in basement;Bed/bath upstairs    Alternate Therapist, sports of Steps 14 steps from first floor to second floor; 12 steps from basement to first floor    Alternate Level Stairs-Rails Right;Left   Right going upstairs and Left going up from basement   Home Equipment None      Prior Function   Level of Independence Independent    Vocation Retired;Part time employment   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, listen to music, watch tv      Cognition   Overall Cognitive Status Within Functional Limits for tasks assessed      Sensation   Light Touch Appears Intact      ROM / Strength   AROM / PROM / Strength Strength      Strength   Strength Assessment  Site Hip;Knee;Ankle    Right/Left Hip Right;Left    Right Hip Flexion 4-/5    Right Hip ABduction 5/5   in seated   Right Hip ADduction 5/5   in seated   Left Hip Flexion 4/5    Left Hip Extension --    Left Hip ABduction 5/5   In seated   Left Hip ADduction 5/5   In seated   Right/Left Knee Right;Left    Right Knee Flexion 4-/5    Right Knee Extension 4/5    Left Knee Flexion 4+/5    Left Knee Extension 5/5    Right/Left Ankle Right;Left    Right Ankle Dorsiflexion 5/5    Left Ankle Dorsiflexion 5/5      Transfers   Transfers Sit to Stand;Stand to Sit    Sit to Stand 6: Modified independent (Device/Increase time)    Five time sit to stand comments  14.78 seconds w/o hands from standard chair    Stand to Sit 6: Modified independent (Device/Increase time)      Ambulation/Gait   Ambulation/Gait Yes    Ambulation/Gait Assistance 5: Supervision    Ambulation Distance (Feet) 175 Feet    Assistive device None    Gait Pattern Step-through pattern    Gait velocity 9.97seconds= 1.0 m/s    Stairs Yes    Stairs Assistance 5: Supervision    Stair Management Technique Alternating pattern;One rail Right    Number of Stairs 4    Height of Stairs 6      Balance   Balance Assessed Yes      Standardized Balance Assessment   Standardized Balance Assessment Timed Up and Go Test      Timed Up and Go Test   TUG Normal TUG    Normal TUG (seconds) 11.19      Functional Gait  Assessment   Gait assessed  Yes    Gait Level Surface Walks 20 ft in less than 7 sec but greater than 5.5 sec, uses assistive device, slower speed, mild gait deviations, or deviates 6-10 in outside of the 12 in walkway width.    Change in Gait Speed Able to change speed, demonstrates mild gait deviations, deviates 6-10 in outside of the 12 in walkway width, or no gait deviations, unable to achieve a major change in velocity, or uses a change  in velocity, or uses an assistive device.    Gait with Horizontal Head Turns  Performs head turns smoothly with no change in gait. Deviates no more than 6 in outside 12 in walkway width    Gait with Vertical Head Turns Performs head turns with no change in gait. Deviates no more than 6 in outside 12 in walkway width.    Gait and Pivot Turn Pivot turns safely within 3 sec and stops quickly with no loss of balance.    Step Over Obstacle Is able to step over 2 stacked shoe boxes taped together (9 in total height) without changing gait speed. No evidence of imbalance.    Gait with Narrow Base of Support Ambulates 4-7 steps.    Gait with Eyes Closed Walks 20 ft, no assistive devices, good speed, no evidence of imbalance, normal gait pattern, deviates no more than 6 in outside 12 in walkway width. Ambulates 20 ft in less than 7 sec.    Ambulating Backwards Walks 20 ft, uses assistive device, slower speed, mild gait deviations, deviates 6-10 in outside 12 in walkway width.    Steps Alternating feet, must use rail.    Total Score 24                        Objective measurements completed on examination: See above findings.                PT Education - 12/24/20 1717     Education Details PT educated pt on POC.    Person(s) Educated Patient    Methods Explanation    Comprehension Verbalized understanding              PT Short Term Goals - 12/24/20 1719       PT SHORT TERM GOAL #1   Title Pt will be indpendent w/ initial HEP focused on balance and strengthening.    Baseline 12/24/20- none provided    Time 4    Period Weeks    Status New    Target Date 01/23/21      PT SHORT TERM GOAL #2   Title Pt will be able to ambulate at >/= 1.2 m/s for improved community ambulation.    Baseline 12/24/20- 1.34m/s    Time 4    Period Weeks    Status New    Target Date 01/23/21      PT SHORT TERM GOAL #3   Title Pt will improve TUG to </= 10 seconds demonstrating improved balance and lowering fall risk.    Baseline 12/24/20- 11.19 seconds     Time 4    Period Weeks    Status New    Target Date 01/23/21      PT SHORT TERM GOAL #4   Title Pt will be able to ambulate stairs w/ reciprocal pattern and no handrail supervision demonstrating improved functional mobility and strength.    Baseline 12/24/20- Reciprocal pattern w/ use of rail    Time 4    Period Weeks    Status New    Target Date 01/23/21               PT Long Term Goals - 12/24/20 1723       PT LONG TERM GOAL #1   Title Pt will indpendent w/ final HEP focused on BLE strengthening and balance. (LTG due 02/20/21)    Time 8    Period Weeks    Status New  Target Date 02/20/21      PT LONG TERM GOAL #2   Title Pt will improve FGA score to >/= 28/30 demonstrating a diminished fall risk.    Baseline 12/24/20- 24/30    Time 8    Period Weeks    Status New    Target Date 02/20/21      PT LONG TERM GOAL #3   Title Pt will be able to perform 5x sit<>stand from standard height chair w/o UE  in </= 10 seconds demonstrating a lowered fall risk    Baseline 12/24/20- 14.78 w/o UE from standard height chair    Time 8    Period Weeks    Status New    Target Date 02/20/21      PT LONG TERM GOAL #4   Title Pt will be able to ambulate stairs while carrying 15lbs supervision for higher quality ADLs.    Time 8    Period Weeks    Status New    Target Date 02/20/21                    Plan - 12/24/20 1709     Clinical Impression Statement Pt presents to the clinic w/ a referral for PT for generalized muscle weakness. Pt presents w/ deficits in strength in BLE. Pt scored a 24/30 on the FGA placing him at a medium fall risk. Pt demonstrated deficits in gait especially backwards and w/ a narrow base of support. Pt's is also at a fall risk based on his 5x sit<>stand time of 14.78 seconds and a TUG of 11.19 seconds. Pt's gait speed is 1.0 m/s but could improve some for higher quality community ambulation. Skilled PT is necessary to address the deficts stated  above and to facilitate pt achievement of STG/LTG.    Personal Factors and Comorbidities Comorbidity 3+    Comorbidities PMH: diverticulitis, frequent UTIs, 01/02/20  XI ROBOT ASSISTED SIGMOIDECTOMY, RIGID PROCTOSCOPY  CYSTOSCOPY WITH FIREFLY INJECTION, HTN, DM2, Anemia, Prostate cancer    Examination-Activity Limitations Carry;Lift;Stairs    Examination-Participation Restrictions Cleaning;Valla Leaver [facility]    Stability/Clinical Decision Making Evolving/Moderate complexity    Clinical Decision Making Moderate    Rehab Potential Good    PT Frequency 2x / week   plus eval   PT Duration 8 weeks    PT Treatment/Interventions ADLs/Self Care Home Management;Aquatic Therapy;Moist Heat;Balance training;Therapeutic exercise;Therapeutic activities;Functional mobility training;Stair training;Gait training;Neuromuscular re-education;Patient/family education;Manual techniques;Energy conservation;Passive range of motion;DME Instruction    PT Next Visit Plan Provide pt initial HEP focused on balance and strengthening (hip flexion and hamstrings noted to be weakest).    Consulted and Agree with Plan of Care Patient             Patient will benefit from skilled therapeutic intervention in order to improve the following deficits and impairments:  Abnormal gait, Decreased activity tolerance, Decreased balance, Decreased range of motion, Decreased endurance, Decreased mobility, Decreased strength, Hypomobility, Impaired flexibility, Pain  Visit Diagnosis: Muscle weakness (generalized)  Other abnormalities of gait and mobility  Unsteadiness on feet     Problem List Patient Active Problem List   Diagnosis Date Noted   Diverticular disease 01/02/2020   Diverticulitis of intestine with abscess 05/11/2019   Diverticulitis of large intestine with perforation and abscess without bleeding    Sepsis ([place])    Renal insufficiency    Diverticulitis 09/03/2017   Deficiency anemia 05/08/2013    Lottie Mussel, Student-PT 12/25/2020, 10:04 AM  [facility]  [facility] [address] [place], [place], [address] Phone: [phone]   Fax:  [phone]  Name: Alexander Foster MRN: [id number] Date of Birth: [birth year]

## 2020-12-25 DIAGNOSIS — E1169 Type 2 diabetes mellitus with other specified complication: Secondary | ICD-10-CM | POA: Diagnosis not present

## 2021-01-30 ENCOUNTER — Ambulatory Visit: Payer: Medicare PPO | Attending: Family Medicine

## 2021-01-30 ENCOUNTER — Other Ambulatory Visit: Payer: Self-pay

## 2021-01-30 DIAGNOSIS — R2689 Other abnormalities of gait and mobility: Secondary | ICD-10-CM | POA: Diagnosis not present

## 2021-01-30 DIAGNOSIS — M6281 Muscle weakness (generalized): Secondary | ICD-10-CM | POA: Insufficient documentation

## 2021-01-30 DIAGNOSIS — R2681 Unsteadiness on feet: Secondary | ICD-10-CM | POA: Insufficient documentation

## 2021-01-30 NOTE — Therapy (Signed)
[facility] [address] [place], [place], [address] Phone: [phone]   Fax:  [phone]  Physical Therapy Treatment  Patient Details  Name: Alexander Foster MRN: [id number] Date of Birth: [birth year] Referring Provider (PT): Lucianne Lei, MD   Encounter Date: 01/30/2021   PT End of Session - 01/30/21 1451     Visit Number 2    Number of Visits 17    Date for PT Re-Evaluation 02/20/21    Authorization Type [employer]are so 10th visit progress note. Auth requested    PT Start Time 2595    PT Stop Time 1527    PT Time Calculation (min) 40 min    Equipment Utilized During Treatment Gait belt    Activity Tolerance Patient tolerated treatment well    Behavior During Therapy WFL for tasks assessed/performed             Past Medical History:  Diagnosis Date   Anemia    Asthma    As a child   Cancer ([place])    seed implant for prostate cancer   Diabetes mellitus without complication ([place])    Hypertension     Past Surgical History:  Procedure Laterality Date   CYSTOSCOPY WITH STENT PLACEMENT Bilateral 01/02/2020   Procedure: CYSTOSCOPY WITH FIREFLY INJECTION;  Surgeon: Ceasar Mons, MD;  Location: [facility];  Service: Urology;  Laterality: Bilateral;   PROSTATE BIOPSY     RADIOACTIVE SEED IMPLANT  2010   TONSILLECTOMY     WISDOM TOOTH EXTRACTION      There were no vitals filed for this visit.   Subjective Assessment - 01/30/21 1449     Subjective Patient reports has been the same since the evaluation. Reports he went to daughters for holidays, and slept on a different mattress. The low back on the R side has been irritated. No falls.    Pertinent History PMH: diverticulitis, frequent UTIs, 01/02/20  XI ROBOT ASSISTED SIGMOIDECTOMY, RIGID PROCTOSCOPY  CYSTOSCOPY WITH FIREFLY INJECTION, HTN, DM2, Anemia, Prostate cancer    Patient Stated Goals Improve stairs and improve overall fitness (endurance,  strenghthen core)    Currently in Pain? Yes    Pain Score 0-No pain   can increase with movements   Pain Location Back    Pain Orientation Right    Pain Descriptors / Indicators Aching;Sharp    Pain Type Acute pain    Pain Onset 1 to 4 weeks ago    Pain Frequency Intermittent              Completed all of the following exercises during session and established initial HEP. Handout provided & reviewed with patient  Access Code: [email or web address] URL: [email or web address] Date: 01/30/2021 Prepared by: Baldomero Lamy  Exercises Standing Hamstring Curl with Resistance - 1 x daily - 5 x weekly - 2 sets - 10 reps - with red theraband at ankles Marching with Resistance at Countertop - 1 x daily - 5 x weekly - 2 sets - 10 reps - with red theraband placed at thighs Standing Hip Abduction with Counter Support - 1 x daily - 5 x weekly - 2 sets - 10 reps - cues to keep knee straight and using light UE support to balance challenge.  Standing Single Leg Stance with Counter Support - 1 x daily - 5 x weekly - 1 sets - 3-4 reps - 10-15 seconds hold - starting with light UE support progressing to no UE support Romberg Stance Eyes Closed  on Foam Pad - 1 x daily - 5 x weekly - 1 sets - 3 reps - 30 seconds hold - educated on proper completion in corner for safety Tandem Stance in Corner - 1 x daily - 5 x weekly - 1 sets - 3 reps - 20-25 seconds hold - more challenge noted with RLE > LLE posterior.     [facility] Adult PT Treatment/Exercise - 01/30/21 0001       Ambulation/Gait   Ambulation/Gait Yes    Ambulation/Gait Assistance 5: Supervision    Ambulation/Gait Assistance Details throughout therapy gym with activities    Assistive device None    Gait Pattern Step-through pattern    Ambulation Surface Level;Indoor                     PT Education - 01/30/21 1531     Education Details Educated on [employer]) Educated Patient    Methods Explanation    Comprehension  Verbalized understanding              PT Short Term Goals - 12/24/20 1719       PT SHORT TERM GOAL #1   Title Pt will be indpendent w/ initial HEP focused on balance and strengthening.    Baseline 12/24/20- none provided    Time 4    Period Weeks    Status New    Target Date 01/23/21      PT SHORT TERM GOAL #2   Title Pt will be able to ambulate at >/= 1.2 m/s for improved community ambulation.    Baseline 12/24/20- 1.40m/s    Time 4    Period Weeks    Status New    Target Date 01/23/21      PT SHORT TERM GOAL #3   Title Pt will improve TUG to </= 10 seconds demonstrating improved balance and lowering fall risk.    Baseline 12/24/20- 11.19 seconds    Time 4    Period Weeks    Status New    Target Date 01/23/21      PT SHORT TERM GOAL #4   Title Pt will be able to ambulate stairs w/ reciprocal pattern and no handrail supervision demonstrating improved functional mobility and strength.    Baseline 12/24/20- Reciprocal pattern w/ use of rail    Time 4    Period Weeks    Status New    Target Date 01/23/21               PT Long Term Goals - 12/24/20 1723       PT LONG TERM GOAL #1   Title Pt will indpendent w/ final HEP focused on BLE strengthening and balance. (LTG due 02/20/21)    Time 8    Period Weeks    Status New    Target Date 02/20/21      PT LONG TERM GOAL #2   Title Pt will improve FGA score to >/= 28/30 demonstrating a diminished fall risk.    Baseline 12/24/20- 24/30    Time 8    Period Weeks    Status New    Target Date 02/20/21      PT LONG TERM GOAL #3   Title Pt will be able to perform 5x sit<>stand from standard height chair w/o UE  in </= 10 seconds demonstrating a lowered fall risk    Baseline 12/24/20- 14.78 w/o UE from standard height chair    Time 8  Period Weeks    Status New    Target Date 02/20/21      PT LONG TERM GOAL #4   Title Pt will be able to ambulate stairs while carrying 15lbs supervision for higher quality  ADLs.    Time 8    Period Weeks    Status New    Target Date 02/20/21                   Plan - 01/30/21 1713     Clinical Impression Statement Today's session focused on establishining HEP, with primary exercises promoting BLE strengthening and standing balance. Most challenge noted with narrow BOS and SLS activities. Pt tolerating all exercises well today. Will continue per POC.    Personal Factors and Comorbidities Comorbidity 3+    Comorbidities PMH: diverticulitis, frequent UTIs, 01/02/20  XI ROBOT ASSISTED SIGMOIDECTOMY, RIGID PROCTOSCOPY  CYSTOSCOPY WITH FIREFLY INJECTION, HTN, DM2, Anemia, Prostate cancer    Examination-Activity Limitations Carry;Lift;Stairs    Examination-Participation Restrictions Cleaning;Valla Leaver Work;Laundry    Stability/Clinical Decision Making Evolving/Moderate complexity    Rehab Potential Good    PT Frequency 2x / week   plus eval   PT Duration 8 weeks    PT Treatment/Interventions ADLs/Self Care Home Management;Aquatic Therapy;Moist Heat;Balance training;Therapeutic exercise;Therapeutic activities;Functional mobility training;Stair training;Gait training;Neuromuscular re-education;Patient/family education;Manual techniques;Energy conservation;Passive range of motion;DME Instruction    PT Next Visit Plan Review HEP if needed. Continued BLE strengthening. Standing Balance on Complaint surfaces, SLS activities, narrow BOS/tandem.Dynamic Gait    Consulted and Agree with Plan of Care Patient             Patient will benefit from skilled therapeutic intervention in order to improve the following deficits and impairments:  Abnormal gait, Decreased activity tolerance, Decreased balance, Decreased range of motion, Decreased endurance, Decreased mobility, Decreased strength, Hypomobility, Impaired flexibility, Pain  Visit Diagnosis: Muscle weakness (generalized)  Other abnormalities of gait and mobility  Unsteadiness on feet     Problem  List Patient Active Problem List   Diagnosis Date Noted   Diverticular disease 01/02/2020   Diverticulitis of intestine with abscess 05/11/2019   Diverticulitis of large intestine with perforation and abscess without bleeding    Sepsis ([place])    Renal insufficiency    Diverticulitis 09/03/2017   Deficiency anemia 05/08/2013    Jones Bales, PT, DPT 01/30/2021, 5:15 PM  [facility] [address] [place], [place], [address] Phone: [phone]   Fax:  [phone]  Name: Alexander Foster MRN: [id number] Date of Birth: 03-27-1950

## 2021-02-03 ENCOUNTER — Ambulatory Visit: Payer: Medicare PPO

## 2021-02-03 ENCOUNTER — Other Ambulatory Visit: Payer: Self-pay

## 2021-02-03 DIAGNOSIS — R2681 Unsteadiness on feet: Secondary | ICD-10-CM | POA: Diagnosis not present

## 2021-02-03 DIAGNOSIS — R2689 Other abnormalities of gait and mobility: Secondary | ICD-10-CM

## 2021-02-03 DIAGNOSIS — M6281 Muscle weakness (generalized): Secondary | ICD-10-CM

## 2021-02-03 NOTE — Therapy (Signed)
[facility] [address] [place], [place], [address] Phone: [phone]   Fax:  [phone]  Physical Therapy Treatment  Patient Details  Name: Alexander Foster MRN: [id number] Date of Birth: Jul 25, 1950 Referring Provider (PT): Lucianne Lei, MD   Encounter Date: 02/03/2021   PT End of Session - 02/03/21 1147     Visit Number 3    Number of Visits 17    Date for PT Re-Evaluation 02/20/21    Authorization Type [employer] medicare so 10th visit progress note. Auth requested    PT Start Time 1147    PT Stop Time 1228    PT Time Calculation (min) 41 min    Equipment Utilized During Treatment Gait belt    Activity Tolerance Patient tolerated treatment well    Behavior During Therapy WFL for tasks assessed/performed             Past Medical History:  Diagnosis Date   Anemia    Asthma    As a child   Cancer ([place])    seed implant for prostate cancer   Diabetes mellitus without complication ([place])    Hypertension     Past Surgical History:  Procedure Laterality Date   CYSTOSCOPY WITH STENT PLACEMENT Bilateral 01/02/2020   Procedure: CYSTOSCOPY WITH FIREFLY INJECTION;  Surgeon: Ceasar Mons, MD;  Location: [facility];  Service: Urology;  Laterality: Bilateral;   PROSTATE BIOPSY     RADIOACTIVE SEED IMPLANT  2010   TONSILLECTOMY     WISDOM TOOTH EXTRACTION      There were no vitals filed for this visit.   Subjective Assessment - 02/03/21 1147     Subjective Reports that he been feeling pretty good, exercises went well. Have a couple of questions about them. No falls.    Pertinent History PMH: diverticulitis, frequent UTIs, 01/02/20  XI ROBOT ASSISTED SIGMOIDECTOMY, RIGID PROCTOSCOPY  CYSTOSCOPY WITH FIREFLY INJECTION, HTN, DM2, Anemia, Prostate cancer    Patient Stated Goals Improve stairs and improve overall fitness (endurance, strenghthen core)    Currently in Pain? Yes    Pain Score 4     Pain Location  Back    Pain Orientation Right    Pain Descriptors / Indicators Aching;Sharp    Pain Type Acute pain    Pain Onset 1 to 4 weeks ago               [facility] Adult PT Treatment/Exercise - 02/03/21 0001       Ambulation/Gait   Ambulation/Gait Yes    Ambulation/Gait Assistance 5: Supervision    Assistive device None    Gait Pattern Step-through pattern    Ambulation Surface Level;Indoor              Balance Exercises - 02/03/21 0001       Balance Exercises: Standing   Standing Eyes Closed Narrow base of support (BOS);Head turns;Foam/compliant surface;3 reps;30 secs;Limitations    Standing Eyes Closed Limitations standing EC on airex, 2 x 30 seconds, then completed horizontal/vertical head turns with EC x 10 reps each    Tandem Stance Eyes open;Intermittent upper extremity support;2 reps;30 secs;Limitations    Tandem Stance Time full tandem, 2 x 30 seconds alternating LE    SLS Eyes open;Solid surface;3 reps;Time    SLS Time 10-15 seconds, alternating LE    SLS with Vectors Foam/compliant surface;Intermittent upper extremity assist;Limitations    SLS with Vectors Limitations standing on airex: completed alternating toe taps x 10 reps bilat  forward, then progressed to crossover toe taps x 10 reps. No UE support used, CGA. Cues to stand tall with SLS activities    Rockerboard Anterior/posterior;EO;Intermittent UE support;Limitations;EC    Rockerboard Limitations standing on rockerboard A/P: completed static standing with EO x 30 seconds, then EC 2 x 30 seconds. Then with EO completed A/P weight shift x 15 reps no UE support    Step Ups Forward;Limitations   8"   Step Ups Limitations standing on airex: completed alteranting step ups without UE support to 8" step, and back onto airex.    Tandem Gait Forward;Foam/compliant surface;3 reps;Limitations    Tandem Gait Limitations completed forward tandem gait on blue balance beam x 3 laps, intermittent UE support.    Sidestepping  Foam/compliant support;3 reps;Limitations    Sidestepping Limitations standing on blue balance beam, completed x 3 laps to R/L with intermittent touch A and CGA.            Reviewed HEP:  Access Code: [email or web address] URL: [email or web address] Date: 01/30/2021 Prepared by: Baldomero Lamy   Exercises Standing Hamstring Curl with Resistance - 1 x daily - 5 x weekly - 2 sets - 10 reps  Marching with Resistance at Countertop - 1 x daily - 5 x weekly - 2 sets - 10 reps Standing Hip Abduction with Counter Support - 1 x daily - 5 x weekly - 2 sets - 10 reps  Standing Single Leg Stance with Counter Support - 1 x daily - 5 x weekly - 1 sets - 3-4 reps - 10-15 seconds hold Romberg Stance Eyes Closed on Foam Pad - 1 x daily - 5 x weekly - 1 sets - 3 reps - 30 seconds hold Tandem Stance in Corner - 1 x daily - 5 x weekly - 1 sets - 3 reps - 20-25 seconds hold    PT Education - 02/03/21 1238     Education Details HEP Review    Person(s) Educated Patient    Methods Explanation    Comprehension Verbalized understanding              PT Short Term Goals - 02/03/21 1241       PT SHORT TERM GOAL #1   Title Pt will be indpendent w/ initial HEP focused on balance and strengthening.    Baseline 12/24/20- none provided    Time 4    Period Weeks    Status New    Target Date 01/23/21      PT SHORT TERM GOAL #2   Title Pt will be able to ambulate at >/= 1.2 m/s for improved community ambulation.    Baseline 12/24/20- 1.102m/s    Time 4    Period Weeks    Status New    Target Date 01/23/21      PT SHORT TERM GOAL #3   Title Pt will improve TUG to </= 10 seconds demonstrating improved balance and lowering fall risk.    Baseline 12/24/20- 11.19 seconds    Time 4    Period Weeks    Status New    Target Date 01/23/21      PT SHORT TERM GOAL #4   Title Pt will be able to ambulate stairs w/ reciprocal pattern and no handrail supervision demonstrating improved functional mobility  and strength.    Baseline 12/24/20- Reciprocal pattern w/ use of rail    Time 4    Period Weeks    Status New    Target Date 01/23/21  PT Long Term Goals - 12/24/20 1723       PT LONG TERM GOAL #1   Title Pt will indpendent w/ final HEP focused on BLE strengthening and balance. (LTG due 02/20/21)    Time 8    Period Weeks    Status New    Target Date 02/20/21      PT LONG TERM GOAL #2   Title Pt will improve FGA score to >/= 28/30 demonstrating a diminished fall risk.    Baseline 12/24/20- 24/30    Time 8    Period Weeks    Status New    Target Date 02/20/21      PT LONG TERM GOAL #3   Title Pt will be able to perform 5x sit<>stand from standard height chair w/o UE  in </= 10 seconds demonstrating a lowered fall risk    Baseline 12/24/20- 14.78 w/o UE from standard height chair    Time 8    Period Weeks    Status New    Target Date 02/20/21      PT LONG TERM GOAL #4   Title Pt will be able to ambulate stairs while carrying 15lbs supervision for higher quality ADLs.    Time 8    Period Weeks    Status New    Target Date 02/20/21                   Plan - 02/03/21 1239     Clinical Impression Statement Reviewed HEP, and addressed questions/concerns. Continued session progressing balance activities, with focus on continued high level balance and vision removed. Will continue to progress toward LTGs.    Personal Factors and Comorbidities Comorbidity 3+    Comorbidities PMH: diverticulitis, frequent UTIs, 01/02/20  XI ROBOT ASSISTED SIGMOIDECTOMY, RIGID PROCTOSCOPY  CYSTOSCOPY WITH FIREFLY INJECTION, HTN, DM2, Anemia, Prostate cancer    Examination-Activity Limitations Carry;Lift;Stairs    Examination-Participation Restrictions Cleaning;Valla Leaver Work;Laundry    Stability/Clinical Decision Making Evolving/Moderate complexity    Rehab Potential Good    PT Frequency 2x / week   plus eval   PT Duration 8 weeks    PT Treatment/Interventions ADLs/Self  Care Home Management;Aquatic Therapy;Moist Heat;Balance training;Therapeutic exercise;Therapeutic activities;Functional mobility training;Stair training;Gait training;Neuromuscular re-education;Patient/family education;Manual techniques;Energy conservation;Passive range of motion;DME Instruction    PT Next Visit Plan STG pushed back to late start to PT. Continued BLE strengthening. Standing Balance on Complaint surfaces, SLS activities, narrow BOS/tandem.Dynamic Gait    Consulted and Agree with Plan of Care Patient             Patient will benefit from skilled therapeutic intervention in order to improve the following deficits and impairments:  Abnormal gait, Decreased activity tolerance, Decreased balance, Decreased range of motion, Decreased endurance, Decreased mobility, Decreased strength, Hypomobility, Impaired flexibility, Pain  Visit Diagnosis: Muscle weakness (generalized)  Other abnormalities of gait and mobility  Unsteadiness on feet     Problem List Patient Active Problem List   Diagnosis Date Noted   Diverticular disease 01/02/2020   Diverticulitis of intestine with abscess 05/11/2019   Diverticulitis of large intestine with perforation and abscess without bleeding    Sepsis ([place])    Renal insufficiency    Diverticulitis 09/03/2017   Deficiency anemia 05/08/2013    Jones Bales, PT, DPT 02/03/2021, 12:43 PM  [facility] [address] [place], [place], [address] Phone: [phone]   Fax:  [phone]  Name: Alexander Foster MRN: [id number] Date of Birth:  05/29/1950 ° ° ° °

## 2021-02-06 ENCOUNTER — Ambulatory Visit: Payer: Medicare PPO | Admitting: Physical Therapy

## 2021-02-06 ENCOUNTER — Other Ambulatory Visit: Payer: Self-pay

## 2021-02-06 DIAGNOSIS — R2689 Other abnormalities of gait and mobility: Secondary | ICD-10-CM | POA: Diagnosis not present

## 2021-02-06 DIAGNOSIS — M6281 Muscle weakness (generalized): Secondary | ICD-10-CM | POA: Diagnosis not present

## 2021-02-06 DIAGNOSIS — R2681 Unsteadiness on feet: Secondary | ICD-10-CM | POA: Diagnosis not present

## 2021-02-06 NOTE — Therapy (Signed)
[facility] [address] [place], [place], [address] Phone: [phone]   Fax:  [phone]  Physical Therapy Treatment  Patient Details  Name: Alexander Foster MRN: [id number] Date of Birth: 1950-06-24 Referring Provider (PT): Lucianne Lei, MD   Encounter Date: 02/06/2021   PT End of Session - 02/06/21 0932     Visit Number 4    Number of Visits 17    Date for PT Re-Evaluation 02/20/21    Authorization Type [employer] so 10th visit progress note. Auth requested    PT Start Time 0932    PT Stop Time 9675    PT Time Calculation (min) 43 min    Equipment Utilized During Treatment Gait belt    Activity Tolerance Patient tolerated treatment well    Behavior During Therapy WFL for tasks assessed/performed             Past Medical History:  Diagnosis Date   Anemia    Asthma    As a child   Cancer ([place])    seed implant for prostate cancer   Diabetes mellitus without complication ([place])    Hypertension     Past Surgical History:  Procedure Laterality Date   CYSTOSCOPY WITH STENT PLACEMENT Bilateral 01/02/2020   Procedure: CYSTOSCOPY WITH FIREFLY INJECTION;  Surgeon: Ceasar Mons, MD;  Location: [facility];  Service: Urology;  Laterality: Bilateral;   PROSTATE BIOPSY     RADIOACTIVE SEED IMPLANT  2010   TONSILLECTOMY     WISDOM TOOTH EXTRACTION      There were no vitals filed for this visit.   Subjective Assessment - 02/06/21 0936     Subjective Nothing new to report. No falls.    Pertinent History PMH: diverticulitis, frequent UTIs, 01/02/20  XI ROBOT ASSISTED SIGMOIDECTOMY, RIGID PROCTOSCOPY  CYSTOSCOPY WITH FIREFLY INJECTION, HTN, DM2, Anemia, Prostate cancer    Patient Stated Goals Improve stairs and improve overall fitness (endurance, strenghthen core)    Currently in Pain? No/denies    Pain Onset 1 to 4 weeks ago                [facility] PT Assessment - 02/06/21 0001        Assessment   Medical Diagnosis Muscle weakness    Referring Provider (PT) Lucianne Lei, MD                           [facility] Adult PT Treatment/Exercise - 02/06/21 0001       Exercises   Exercises Knee/Hip      Knee/Hip Exercises: Aerobic   Other Aerobic Sci-Fit L4 x 5 min UEs/LEs      Knee/Hip Exercises: Machines for Strengthening   Cybex Leg Press --      Knee/Hip Exercises: Standing   Knee Flexion Strengthening;Left;Right;2 sets;10 reps    Knee Flexion Limitations green tband    Hip Flexion Stengthening;Right;Left;2 sets;10 reps    Hip Flexion Limitations green tband, marching    Hip Abduction Stengthening;Right;Left;2 sets;10 reps;Knee straight    Abduction Limitations green tband    Hip Extension Stengthening;Right;Left;2 sets;10 reps;Knee straight    Extension Limitations green tband    Other Standing Knee Exercises Palloff press green tband x10                 Balance Exercises - 02/06/21 0001       Balance Exercises: Standing   Standing Eyes Closed Narrow base of support (  BOS);Head turns;Foam/compliant surface;30 secs;Limitations;2 reps    Standing Eyes Closed Limitations head turns eyes and head nods 2x30 sec    Tandem Stance Eyes open;Intermittent upper extremity support;2 reps;30 secs;Limitations    SLS Eyes open;Solid surface;3 reps;Time;10 secs                  PT Short Term Goals - 02/03/21 1241       PT SHORT TERM GOAL #1   Title Pt will be indpendent w/ initial HEP focused on balance and strengthening.    Baseline 12/24/20- none provided    Time 4    Period Weeks    Status New    Target Date 01/23/21      PT SHORT TERM GOAL #2   Title Pt will be able to ambulate at >/= 1.2 m/s for improved community ambulation.    Baseline 12/24/20- 1.71m/s    Time 4    Period Weeks    Status New    Target Date 01/23/21      PT SHORT TERM GOAL #3   Title Pt will improve TUG to </= 10 seconds demonstrating improved balance and  lowering fall risk.    Baseline 12/24/20- 11.19 seconds    Time 4    Period Weeks    Status New    Target Date 01/23/21      PT SHORT TERM GOAL #4   Title Pt will be able to ambulate stairs w/ reciprocal pattern and no handrail supervision demonstrating improved functional mobility and strength.    Baseline 12/24/20- Reciprocal pattern w/ use of rail    Time 4    Period Weeks    Status New    Target Date 01/23/21               PT Long Term Goals - 12/24/20 1723       PT LONG TERM GOAL #1   Title Pt will indpendent w/ final HEP focused on BLE strengthening and balance. (LTG due 02/20/21)    Time 8    Period Weeks    Status New    Target Date 02/20/21      PT LONG TERM GOAL #2   Title Pt will improve FGA score to >/= 28/30 demonstrating a diminished fall risk.    Baseline 12/24/20- 24/30    Time 8    Period Weeks    Status New    Target Date 02/20/21      PT LONG TERM GOAL #3   Title Pt will be able to perform 5x sit<>stand from standard height chair w/o UE  in </= 10 seconds demonstrating a lowered fall risk    Baseline 12/24/20- 14.78 w/o UE from standard height chair    Time 8    Period Weeks    Status New    Target Date 02/20/21      PT LONG TERM GOAL #4   Title Pt will be able to ambulate stairs while carrying 15lbs supervision for higher quality ADLs.    Time 8    Period Weeks    Status New    Target Date 02/20/21                   Plan - 02/06/21 0950     Clinical Impression Statement Progressed pt's strengthening exercises this session. Able to tolerate green tband. Continued to work on balance activities -- remains challenged by tandem stance/narrow BOS. Pt is progressing well towards his LTGs.  Personal Factors and Comorbidities Comorbidity 3+    Comorbidities PMH: diverticulitis, frequent UTIs, 01/02/20  XI ROBOT ASSISTED SIGMOIDECTOMY, RIGID PROCTOSCOPY  CYSTOSCOPY WITH FIREFLY INJECTION, HTN, DM2, Anemia, Prostate cancer     Examination-Activity Limitations Carry;Lift;Stairs    Examination-Participation Restrictions Cleaning;Valla Leaver Work;Laundry    Stability/Clinical Decision Making Evolving/Moderate complexity    Rehab Potential Good    PT Frequency 2x / week   plus eval   PT Duration 8 weeks    PT Treatment/Interventions ADLs/Self Care Home Management;Aquatic Therapy;Moist Heat;Balance training;Therapeutic exercise;Therapeutic activities;Functional mobility training;Stair training;Gait training;Neuromuscular re-education;Patient/family education;Manual techniques;Energy conservation;Passive range of motion;DME Instruction    PT Next Visit Plan STG pushed back to late start to PT. Continued BLE strengthening. Standing Balance on Complaint surfaces, SLS activities, narrow BOS/tandem.Dynamic Gait    PT Home Exercise Plan [email or web address]    Consulted and Agree with Plan of Care Patient             Patient will benefit from skilled therapeutic intervention in order to improve the following deficits and impairments:  Abnormal gait, Decreased activity tolerance, Decreased balance, Decreased range of motion, Decreased endurance, Decreased mobility, Decreased strength, Hypomobility, Impaired flexibility, Pain  Visit Diagnosis: Muscle weakness (generalized)  Other abnormalities of gait and mobility  Unsteadiness on feet     Problem List Patient Active Problem List   Diagnosis Date Noted   Diverticular disease 01/02/2020   Diverticulitis of intestine with abscess 05/11/2019   Diverticulitis of large intestine with perforation and abscess without bleeding    Sepsis [facility])    Renal insufficiency    Diverticulitis 09/03/2017   Deficiency anemia 05/08/2013    Kenzly Rogoff April Gordy Levan, PT, DPT 02/06/2021, 10:16 AM  [facility] [address] [place], [place], [address] Phone: [phone]   Fax:  [phone]  Name: Alexander Foster MRN: [id number] Date of  Birth: 07-30-1950

## 2021-02-09 ENCOUNTER — Ambulatory Visit: Payer: Medicare PPO

## 2021-02-09 ENCOUNTER — Other Ambulatory Visit: Payer: Self-pay

## 2021-02-09 DIAGNOSIS — R2689 Other abnormalities of gait and mobility: Secondary | ICD-10-CM | POA: Diagnosis not present

## 2021-02-09 DIAGNOSIS — R2681 Unsteadiness on feet: Secondary | ICD-10-CM

## 2021-02-09 DIAGNOSIS — M6281 Muscle weakness (generalized): Secondary | ICD-10-CM

## 2021-02-09 NOTE — Therapy (Signed)
[facility] [address] [place], [place], [address] Phone: [phone]   Fax:  [phone]  Physical Therapy Treatment  Patient Details  Name: Alexander Foster MRN: [id number] Date of Birth: 03/02/1950 Referring Provider (PT): Lucianne Lei, MD   Encounter Date: 02/09/2021   PT End of Session - 02/09/21 1234     Visit Number 5    Number of Visits 17    Date for PT Re-Evaluation 02/20/21    Authorization Type [employer] medicare so 10th visit progress note. Auth requested    PT Start Time 1234   Pt arriving late   PT Stop Time 1313    PT Time Calculation (min) 39 min    Equipment Utilized During Treatment Gait belt    Activity Tolerance Patient tolerated treatment well    Behavior During Therapy WFL for tasks assessed/performed             Past Medical History:  Diagnosis Date   Anemia    Asthma    As a child   Cancer ([place])    seed implant for prostate cancer   Diabetes mellitus without complication ([place])    Hypertension     Past Surgical History:  Procedure Laterality Date   CYSTOSCOPY WITH STENT PLACEMENT Bilateral 01/02/2020   Procedure: CYSTOSCOPY WITH FIREFLY INJECTION;  Surgeon: Ceasar Mons, MD;  Location: [facility];  Service: Urology;  Laterality: Bilateral;   PROSTATE BIOPSY     RADIOACTIVE SEED IMPLANT  2010   TONSILLECTOMY     WISDOM TOOTH EXTRACTION      There were no vitals filed for this visit.   Subjective Assessment - 02/09/21 1236     Subjective No new changes/complaints. No falls. No pain.    Pertinent History PMH: diverticulitis, frequent UTIs, 01/02/20  XI ROBOT ASSISTED SIGMOIDECTOMY, RIGID PROCTOSCOPY  CYSTOSCOPY WITH FIREFLY INJECTION, HTN, DM2, Anemia, Prostate cancer    Patient Stated Goals Improve stairs and improve overall fitness (endurance, strenghthen core)    Currently in Pain? No/denies    Pain Onset 1 to 4 weeks ago                [facility] PT Assessment -  02/09/21 0001       Standardized Balance Assessment   Standardized Balance Assessment Timed Up and Go Test      Timed Up and Go Test   TUG Normal TUG    Normal TUG (seconds) 8.5   no AD             [facility] Adult PT Treatment/Exercise - 02/09/21 0001       Ambulation/Gait   Ambulation/Gait Yes    Ambulation/Gait Assistance 5: Supervision    Ambulation/Gait Assistance Details throughout therapy gym with activities    Assistive device None    Gait Pattern Step-through pattern    Ambulation Surface Level;Indoor    Gait velocity 8.13 seconds = 1.23 m/s    Stairs Yes    Stairs Assistance 5: Supervision    Stairs Assistance Details (indicate cue type and reason) able to ascend/descend withotu rails, reciprocal pattern. Supervision. mild unsteadiness iwth descent.    Stair Management Technique No rails;Alternating pattern;Forwards    Number of Stairs 8    Height of Stairs 6      Knee/Hip Exercises: Aerobic   Other Aerobic Completed Scifit on Level 4.0 x 5 min with BUEs/BLEs for improved strengthing/activity tolerance/endurance  Balance Exercises - 02/09/21 0001       Balance Exercises: Standing   Rockerboard Anterior/posterior;EO;Intermittent UE support;Limitations;EC    Rockerboard Limitations standing on rockerboard A/P: completed static standing with EO, completed x 10 reps with horizontal/vertical head turns.then with maintaing board steady, completed  EC 3 x 30 seconds.    Tandem Gait Forward;Foam/compliant surface    Tandem Gait Limitations completed forward tandem gait on blue balance beam x 3 laps, intermittent UE support. added to HEP on firm surfaces.            HEP Review and Update:   Access Code: [email or web address] URL: [email or web address] Date: 02/09/2021 Prepared by: Baldomero Lamy  Exercises Standing Hamstring Curl with Resistance - 1 x daily - 5 x weekly - 2 sets - 10 reps Marching with Resistance at Countertop - 1 x daily - 5  x weekly - 2 sets - 10 reps Standing Single Leg Stance with Counter Support - 1 x daily - 5 x weekly - 1 sets - 3-4 reps - 10-15 seconds hold Romberg Stance Eyes Closed on Foam Pad - 1 x daily - 5 x weekly - 1 sets - 3 reps - 30 seconds hold Tandem Stance in Corner - 1 x daily - 5 x weekly - 1 sets - 3 reps - 20-25 seconds hold Hip Abduction with Resistance Loop - 1 x daily - 5 x weekly - 2 sets - 10 reps Hip Extension with Resistance Loop - 1 x daily - 5 x weekly - 2 sets - 10 reps Tandem Walking - 1 x daily - 5 x weekly - 1 sets - 3-4 reps - [place]     PT Education - 02/09/21 1239     Education Details Progress toward STGs; HEP Update    Person(s) Educated Patient    Methods Explanation    Comprehension Verbalized understanding              PT Short Term Goals - 02/09/21 1241       PT SHORT TERM GOAL #1   Title Pt will be indpendent w/ initial HEP focused on balance and strengthening.    Baseline 12/24/20- none provided; reports independene with current HEP    Time 4    Period Weeks    Status Achieved    Target Date 01/23/21      PT SHORT TERM GOAL #2   Title Pt will be able to ambulate at >/= 1.2 m/s for improved community ambulation.    Baseline 12/24/20- 1.44m/s; 1/16: 1.23 m/s    Time 4    Period Weeks    Status Achieved    Target Date 01/23/21      PT SHORT TERM GOAL #3   Title Pt will improve TUG to </= 10 seconds demonstrating improved balance and lowering fall risk.    Baseline 12/24/20- 11.19 seconds; 1/16: 8.50 secs    Time 4    Period Weeks    Status Achieved    Target Date 01/23/21      PT SHORT TERM GOAL #4   Title Pt will be able to ambulate stairs w/ reciprocal pattern and no handrail supervision demonstrating improved functional mobility and strength.    Baseline 12/24/20- Reciprocal pattern w/ use of rail; 1/16 8 stairs no UE support reciprocal pattern supervision    Time 4    Period Weeks    Status Achieved    Target Date 01/23/21  PT Long Term Goals - 12/24/20 1723       PT LONG TERM GOAL #1   Title Pt will indpendent w/ final HEP focused on BLE strengthening and balance. (LTG due 02/20/21)    Time 8    Period Weeks    Status New    Target Date 02/20/21      PT LONG TERM GOAL #2   Title Pt will improve FGA score to >/= 28/30 demonstrating a diminished fall risk.    Baseline 12/24/20- 24/30    Time 8    Period Weeks    Status New    Target Date 02/20/21      PT LONG TERM GOAL #3   Title Pt will be able to perform 5x sit<>stand from standard height chair w/o UE  in </= 10 seconds demonstrating a lowered fall risk    Baseline 12/24/20- 14.78 w/o UE from standard height chair    Time 8    Period Weeks    Status New    Target Date 02/20/21      PT LONG TERM GOAL #4   Title Pt will be able to ambulate stairs while carrying 15lbs supervision for higher quality ADLs.    Time 8    Period Weeks    Status New    Target Date 02/20/21                   Plan - 02/09/21 1239     Clinical Impression Statement Completed assesment of patient's progress toward STGs. Patient able to meet all STGs today. Patient has improved TUG to 8.50 secs and gait speed of 1.23 m/s demonstrating improved balance and reduced fall risk. Continue to have mild unsteadiness with desccending stairs without UE support. Added tandem gait to HEP. Patient tolerating all activities well. Will continue to progress toward all LTGs.    Personal Factors and Comorbidities Comorbidity 3+    Comorbidities PMH: diverticulitis, frequent UTIs, 01/02/20  XI ROBOT ASSISTED SIGMOIDECTOMY, RIGID PROCTOSCOPY  CYSTOSCOPY WITH FIREFLY INJECTION, HTN, DM2, Anemia, Prostate cancer    Examination-Activity Limitations Carry;Lift;Stairs    Examination-Participation Restrictions Cleaning;Valla Leaver Work;Laundry    Stability/Clinical Decision Making Evolving/Moderate complexity    Rehab Potential Good    PT Frequency 2x / week   plus eval   PT  Duration 8 weeks    PT Treatment/Interventions ADLs/Self Care Home Management;Aquatic Therapy;Moist Heat;Balance training;Therapeutic exercise;Therapeutic activities;Functional mobility training;Stair training;Gait training;Neuromuscular re-education;Patient/family education;Manual techniques;Energy conservation;Passive range of motion;DME Instruction    PT Next Visit Plan How was HEP addition? Continued BLE strengthening. Standing Balance on Complaint surfaces, SLS activities, narrow BOS/tandem.Dynamic Gait    PT Home Exercise Plan [email or web address]    Consulted and Agree with Plan of Care Patient             Patient will benefit from skilled therapeutic intervention in order to improve the following deficits and impairments:  Abnormal gait, Decreased activity tolerance, Decreased balance, Decreased range of motion, Decreased endurance, Decreased mobility, Decreased strength, Hypomobility, Impaired flexibility, Pain  Visit Diagnosis: Muscle weakness (generalized)  Other abnormalities of gait and mobility  Unsteadiness on feet     Problem List Patient Active Problem List   Diagnosis Date Noted   Diverticular disease 01/02/2020   Diverticulitis of intestine with abscess 05/11/2019   Diverticulitis of large intestine with perforation and abscess without bleeding    Sepsis ([place])    Renal insufficiency    Diverticulitis 09/03/2017   Deficiency anemia 05/08/2013  Jones Bales, PT, DPT 02/09/2021, 1:15 PM  [facility] [address] [place], [place], [address] Phone: [phone]   Fax:  [phone]  Name: Alexander Foster MRN: [id number] Date of Birth: 10/11/1950

## 2021-02-09 NOTE — Patient Instructions (Signed)
Access Code: [email or web address] URL: [email or web address] Date: 02/09/2021 Prepared by: Baldomero Lamy  Exercises Standing Hamstring Curl with Resistance - 1 x daily - 5 x weekly - 2 sets - 10 reps Marching with Resistance at Countertop - 1 x daily - 5 x weekly - 2 sets - 10 reps Standing Single Leg Stance with Counter Support - 1 x daily - 5 x weekly - 1 sets - 3-4 reps - 10-15 seconds hold Romberg Stance Eyes Closed on Foam Pad - 1 x daily - 5 x weekly - 1 sets - 3 reps - 30 seconds hold Tandem Stance in Corner - 1 x daily - 5 x weekly - 1 sets - 3 reps - 20-25 seconds hold Hip Abduction with Resistance Loop - 1 x daily - 5 x weekly - 2 sets - 10 reps Hip Extension with Resistance Loop - 1 x daily - 5 x weekly - 2 sets - 10 reps Tandem Walking - 1 x daily - 5 x weekly - 1 sets - 3-4 reps

## 2021-02-13 ENCOUNTER — Other Ambulatory Visit: Payer: Self-pay

## 2021-02-13 ENCOUNTER — Ambulatory Visit: Payer: Medicare PPO

## 2021-02-13 DIAGNOSIS — R2681 Unsteadiness on feet: Secondary | ICD-10-CM | POA: Diagnosis not present

## 2021-02-13 DIAGNOSIS — R2689 Other abnormalities of gait and mobility: Secondary | ICD-10-CM

## 2021-02-13 DIAGNOSIS — M6281 Muscle weakness (generalized): Secondary | ICD-10-CM | POA: Diagnosis not present

## 2021-02-14 NOTE — Therapy (Signed)
[facility] [address] [place], [place], [address] Phone: [phone]   Fax:  [phone]  Physical Therapy Treatment/Recert  Patient Details  Name: Alexander Foster MRN: [id number] Date of Birth: May 31, 1950 Referring Provider (PT): Lucianne Lei, MD   Encounter Date: 02/13/2021   PT End of Session - 02/13/21 1236     Visit Number 6    Number of Visits 18    Date for PT Re-Evaluation 04/10/21    Authorization Type [employer] so 10th visit progress note. Auth requested    PT Start Time 1234    PT Stop Time 1312    PT Time Calculation (min) 38 min    Equipment Utilized During Treatment Gait belt    Activity Tolerance Patient tolerated treatment well    Behavior During Therapy WFL for tasks assessed/performed             Past Medical History:  Diagnosis Date   Anemia    Asthma    As a child   Cancer ([place])    seed implant for prostate cancer   Diabetes mellitus without complication ([facility] [place])    Hypertension     Past Surgical History:  Procedure Laterality Date   CYSTOSCOPY WITH STENT PLACEMENT Bilateral 01/02/2020   Procedure: CYSTOSCOPY WITH FIREFLY INJECTION;  Surgeon: Ceasar Mons, MD;  Location: [facility];  Service: Urology;  Laterality: Bilateral;   PROSTATE BIOPSY     RADIOACTIVE SEED IMPLANT  2010   TONSILLECTOMY     WISDOM TOOTH EXTRACTION      There were no vitals filed for this visit.   Subjective Assessment - 02/13/21 1236     Subjective Pt reports that exercises are helping. He finds that if he focuses on core it really helps on his balance. Also trying to focus more on his breathing.    Pertinent History PMH: diverticulitis, frequent UTIs, 01/02/20  XI ROBOT ASSISTED SIGMOIDECTOMY, RIGID PROCTOSCOPY  CYSTOSCOPY WITH FIREFLY INJECTION, HTN, DM2, Anemia, Prostate cancer    Patient Stated Goals Improve stairs and improve overall fitness (endurance, strenghthen core)    Currently in  Pain? No/denies    Pain Onset 1 to 4 weeks ago                [facility] PT Assessment - 02/13/21 1237       Assessment   Medical Diagnosis Muscle weakness    Referring Provider (PT) Lucianne Lei, MD    Onset Date/Surgical Date 11/14/20      Functional Gait  Assessment   Gait assessed  Yes    Gait Level Surface Walks 20 ft in less than 7 sec but greater than 5.5 sec, uses assistive device, slower speed, mild gait deviations, or deviates 6-10 in outside of the 12 in walkway width.    Change in Gait Speed Able to smoothly change walking speed without loss of balance or gait deviation. Deviate no more than 6 in outside of the 12 in walkway width.    Gait with Horizontal Head Turns Performs head turns with moderate changes in gait velocity, slows down, deviates 10-15 in outside 12 in walkway width but recovers, can continue to walk.    Gait with Vertical Head Turns Performs head turns with no change in gait. Deviates no more than 6 in outside 12 in walkway width.    Gait and Pivot Turn Pivot turns safely within 3 sec and stops quickly with no loss of balance.    Step Over  Obstacle Is able to step over 2 stacked shoe boxes taped together (9 in total height) without changing gait speed. No evidence of imbalance.    Gait with Narrow Base of Support Is able to ambulate for 10 steps heel to toe with no staggering.    Gait with Eyes Closed Walks 20 ft, slow speed, abnormal gait pattern, evidence for imbalance, deviates 10-15 in outside 12 in walkway width. Requires more than 9 sec to ambulate 20 ft.    Ambulating Backwards Walks 20 ft, uses assistive device, slower speed, mild gait deviations, deviates 6-10 in outside 12 in walkway width.    Steps Alternating feet, no rail.    Total Score 24                           [facility] Adult PT Treatment/Exercise - 02/13/21 1237       Transfers   Transfers Sit to Stand;Stand to Sit    Sit to Stand 6: Modified independent (Device/Increase  time)    Five time sit to stand comments  14.74 sec from chair without hands    Stand to Sit 6: Modified independent (Device/Increase time)      Ambulation/Gait   Ambulation/Gait Yes    Ambulation/Gait Assistance 5: Supervision    Ambulation/Gait Assistance Details around in session between activities    Assistive device None    Gait Pattern Step-through pattern    Ambulation Surface Level;Indoor    Stairs Yes    Stairs Assistance 5: Supervision    Stairs Assistance Details (indicate cue type and reason) performed carrying 15# weight to try to mimic laundry. PT did advise that he consider at laundry bag so he can hold railing with 1 hand for safety and he did like that idea. Laundry basket would also make difficult to see foot placement.    Stair Management Technique No rails;Alternating pattern    Number of Stairs 12    Height of Stairs 6      Neuro Re-ed    Neuro Re-ed Details  In // bars: tandem walk on blue mat without UE support x 6 bouts with focusing on engaging core to help with stability, tandem stance on blue mat 30 sec each position then adding in  head turns left/right, feet together with eyes closed x30 sec then adding in head turns to eyes closed x 30 sec. Pt had increased sway with tandem stance. Sit to stand x 5 from mat with feet on airex then with holding 2.2# ball in front and raising over head when upright x 5 with cues to engage core.                     PT Education - 02/14/21 0853     Education Details Results of testing and recert plan for 2x/week for 4 weeks and 1x/week for 4 weeks.    Person(s) Educated Patient    Methods Explanation    Comprehension Verbalized understanding              PT Short Term Goals - 02/09/21 1241       PT SHORT TERM GOAL #1   Title Pt will be indpendent w/ initial HEP focused on balance and strengthening.    Baseline 12/24/20- none provided; reports independene with current HEP    Time 4    Period Weeks     Status Achieved    Target Date 01/23/21  PT SHORT TERM GOAL #2   Title Pt will be able to ambulate at >/= 1.2 m/s for improved community ambulation.    Baseline 12/24/20- 1.63ms; 1/16: 1.23 m/s    Time 4    Period Weeks    Status Achieved    Target Date 01/23/21      PT SHORT TERM GOAL #3   Title Pt will improve TUG to </= 10 seconds demonstrating improved balance and lowering fall risk.    Baseline 12/24/20- 11.19 seconds; 1/16: 8.50 secs    Time 4    Period Weeks    Status Achieved    Target Date 01/23/21      PT SHORT TERM GOAL #4   Title Pt will be able to ambulate stairs w/ reciprocal pattern and no handrail supervision demonstrating improved functional mobility and strength.    Baseline 12/24/20- Reciprocal pattern w/ use of rail; 1/16 8 stairs no UE support reciprocal pattern supervision    Time 4    Period Weeks    Status Achieved    Target Date 01/23/21               PT Long Term Goals - 02/13/21 1249       PT LONG TERM GOAL #1   Title Pt will indpendent w/ final HEP focused on BLE strengthening and balance. (LTG due 02/20/21)    Baseline 02/13/21 PT continues to add to HEP. Pt performing current exercises.    Time 8    Period Weeks    Status On-going    Target Date 02/20/21      PT LONG TERM GOAL #2   Title Pt will improve FGA score to >/= 28/30 demonstrating a diminished fall risk.    Baseline 12/24/20- 24/30. 02/13/21 24/30    Time 8    Period Weeks    Status Not Met    Target Date 02/20/21      PT LONG TERM GOAL #3   Title Pt will be able to perform 5x sit<>stand from standard height chair w/o UE  in </= 10 seconds demonstrating a lowered fall risk    Baseline 12/24/20- 14.78 w/o UE from standard height chair. 02/13/21 14.74 sec    Time 8    Period Weeks    Status Not Met    Target Date 02/20/21      PT LONG TERM GOAL #4   Title Pt will be able to ambulate stairs while carrying 15lbs supervision for higher quality ADLs.    Baseline 02/13/21  Pt was able to perform 12 steps while carrying 15lb weight supervision.    Time 8    Period Weeks    Status Achieved    Target Date 02/20/21             Updated PT goals:  PT Short Term Goals - 02/14/21 0904       PT SHORT TERM GOAL #1   Title Pt will be independent with progressive HEP for strengthening (including core), balance and aerobic activity to continue gains on own.    Time 4    Period Weeks    Status New      PT SHORT TERM GOAL #2   Title Pt will decrease 5 x sit to stand from 14.74 sec to <13 sec for improved balance and functional strength.    Baseline 02/13/21 14.74 sec from chair without hands    Time 4    Period Weeks    Status New  Target Date 03/14/21             PT Long Term Goals - 02/14/21 0907       PT LONG TERM GOAL #1   Title Pt will ambulate on varied surfaces >1000' independently with no LOB for improved community mobility.    Time 8    Period Weeks    Status New    Target Date 04/10/21      PT LONG TERM GOAL #2   Title Pt will improve FGA score to >/= 27/30 demonstrating a diminished fall risk.    Baseline 12/24/20- 24/30. 02/13/21 24/30    Time 8    Period Weeks    Status Revised    Target Date 04/10/21                  Plan - 02/14/21 0857     Clinical Impression Statement Pt assessed LTGs as plan to recert pt. Pt has been performing currently HEP but PT continues to add to it. Pt showed improved functional strength and balance with being able to negotiate up/down stairs carrying 15# weight supervision. Pt did not meet FGA or 5 x sit to stand goals. He would continue to benefit from more work on balance activities. Has had limited visits due to delay in scheduling.    Personal Factors and Comorbidities Comorbidity 3+    Comorbidities PMH: diverticulitis, frequent UTIs, 01/02/20  XI ROBOT ASSISTED SIGMOIDECTOMY, RIGID PROCTOSCOPY  CYSTOSCOPY WITH FIREFLY INJECTION, HTN, DM2, Anemia, Prostate cancer    Examination-Activity  Limitations Carry;Lift;Stairs    Examination-Participation Restrictions Cleaning;Valla Leaver Work;Laundry    Stability/Clinical Decision Making Evolving/Moderate complexity    Rehab Potential Good    PT Frequency 2x / week   followed by 1x/week for 4 weeks   PT Duration 4 weeks    PT Treatment/Interventions ADLs/Self Care Home Management;Aquatic Therapy;Moist Heat;Balance training;Therapeutic exercise;Therapeutic activities;Functional mobility training;Stair training;Gait training;Neuromuscular re-education;Patient/family education;Manual techniques;Energy conservation;Passive range of motion;DME Instruction    PT Next Visit Plan Incorporate more core stabilization activities. Continued BLE strengthening. Standing Balance on Complaint surfaces, SLS activities, narrow BOS/tandem.Dynamic Gait    PT Home Exercise Plan [email or web address]    Consulted and Agree with Plan of Care Patient             Patient will benefit from skilled therapeutic intervention in order to improve the following deficits and impairments:  Abnormal gait, Decreased activity tolerance, Decreased balance, Decreased range of motion, Decreased endurance, Decreased mobility, Decreased strength, Hypomobility, Impaired flexibility, Pain  Visit Diagnosis: Other abnormalities of gait and mobility  Muscle weakness (generalized)  Unsteadiness on feet     Problem List Patient Active Problem List   Diagnosis Date Noted   Diverticular disease 01/02/2020   Diverticulitis of intestine with abscess 05/11/2019   Diverticulitis of large intestine with perforation and abscess without bleeding    Sepsis ([place])    Renal insufficiency    Diverticulitis 09/03/2017   Deficiency anemia 05/08/2013    Electa Sniff, PT, DPT, NCS 02/14/2021, 9:04 AM  [facility] [address] [place], [place], [address] Phone: [phone]   Fax:  [phone]  Name: FERNIE GRIMM MRN:  [id number] Date of Birth: 25-Mar-1950

## 2021-02-16 ENCOUNTER — Ambulatory Visit: Payer: Medicare PPO

## 2021-02-24 ENCOUNTER — Ambulatory Visit: Payer: Medicare PPO

## 2021-03-05 ENCOUNTER — Ambulatory Visit: Payer: Medicare PPO

## 2021-03-15 IMAGING — CR DG LUMBAR SPINE COMPLETE 4+V
5 series · 5 of 5 positions shown · non-contrast
Comparison: None.

CLINICAL DATA: Low back pain, muscle spasm

EXAM:
LUMBAR SPINE - COMPLETE 4+ VIEW

[t l-spine a.p.]
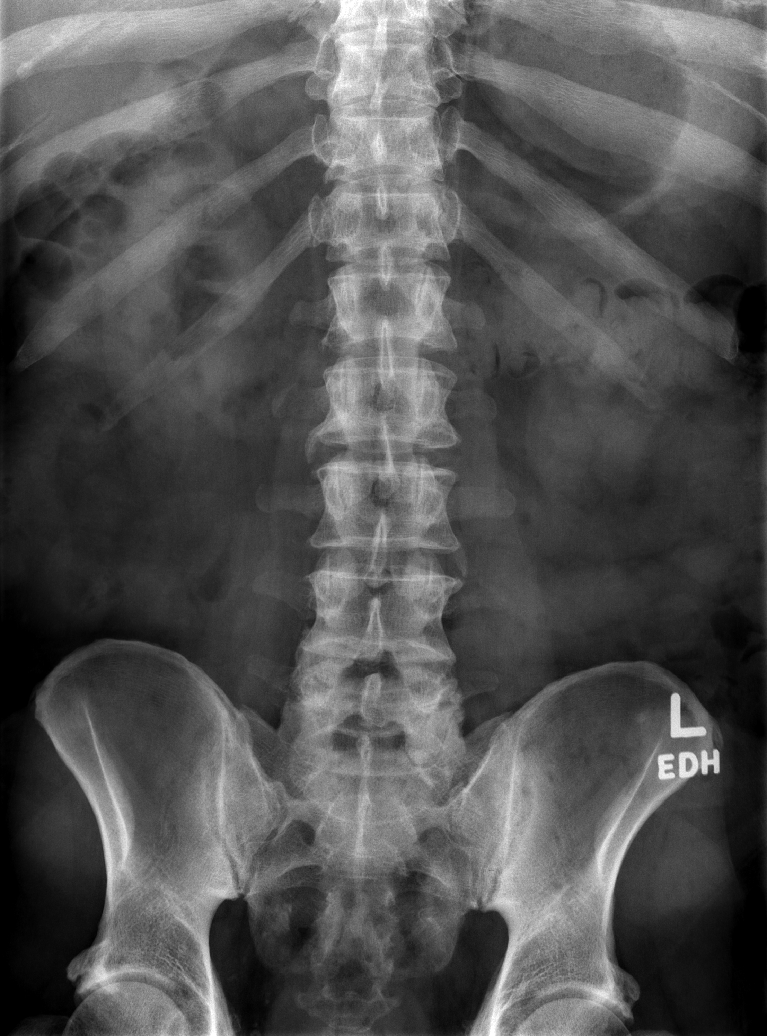

[t l-spine oblique exposure (1 of 2)]
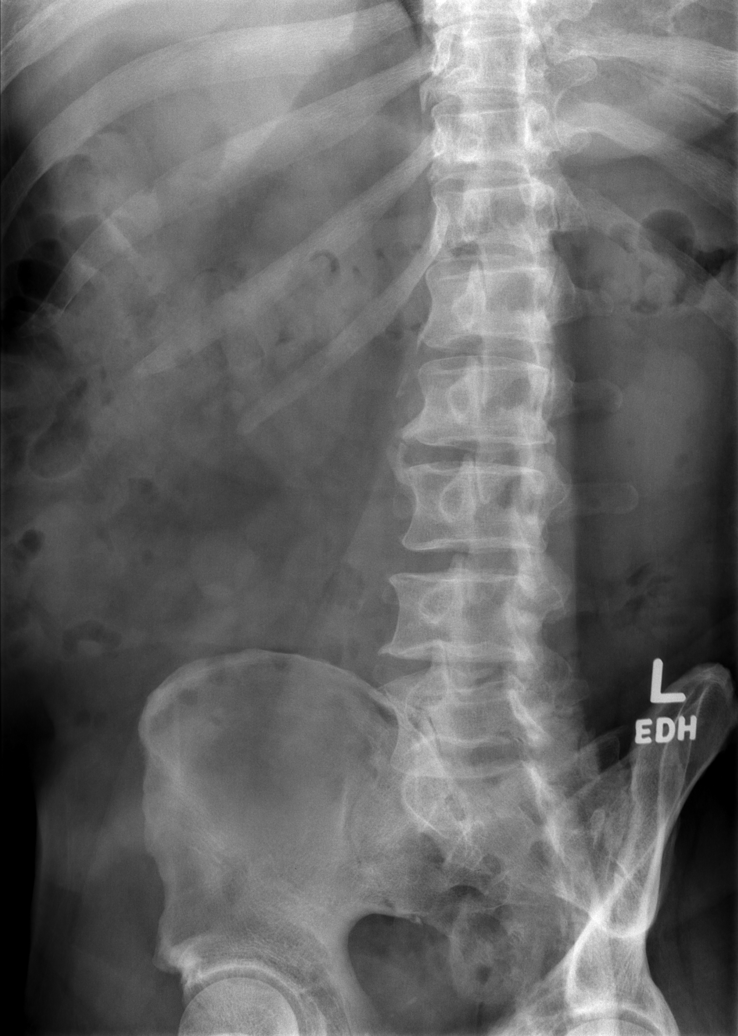

[t l-spine oblique exposure (2 of 2)]
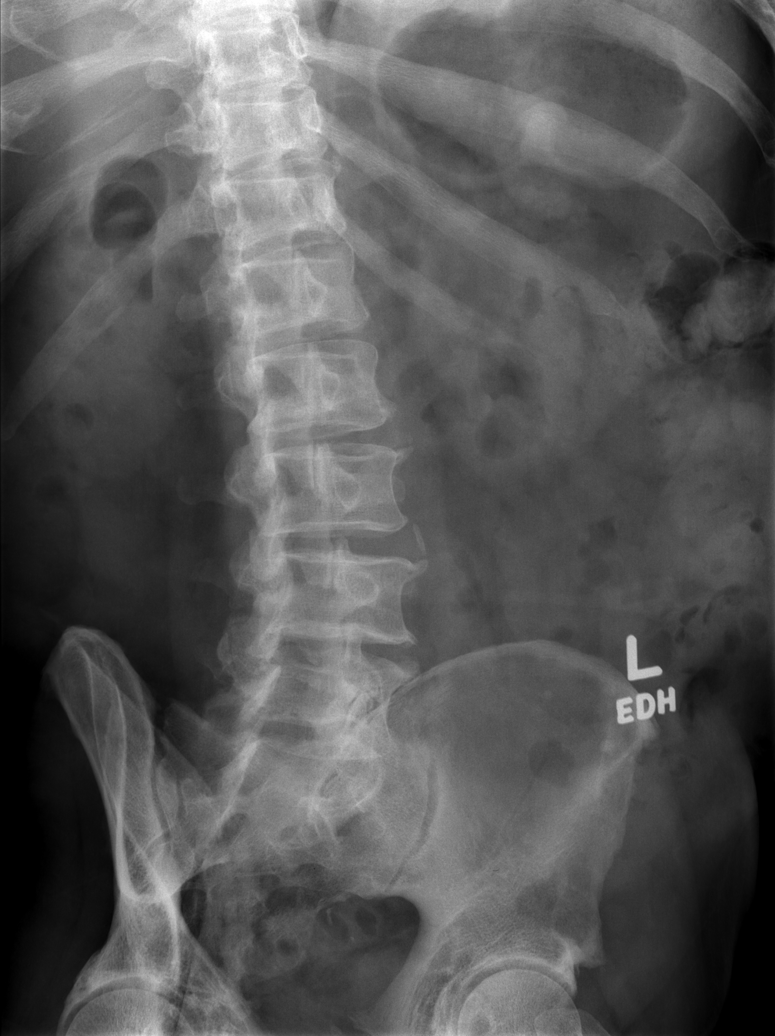

[t l-spine lat]
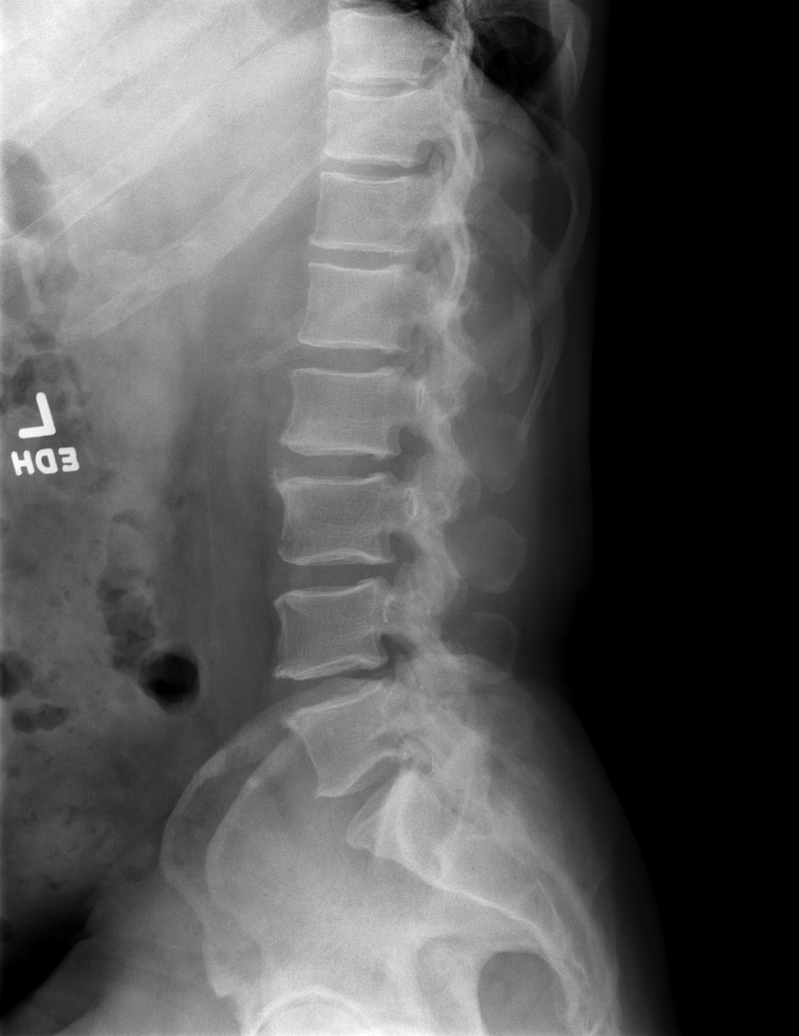

[t l-spine l5-s1 spot]
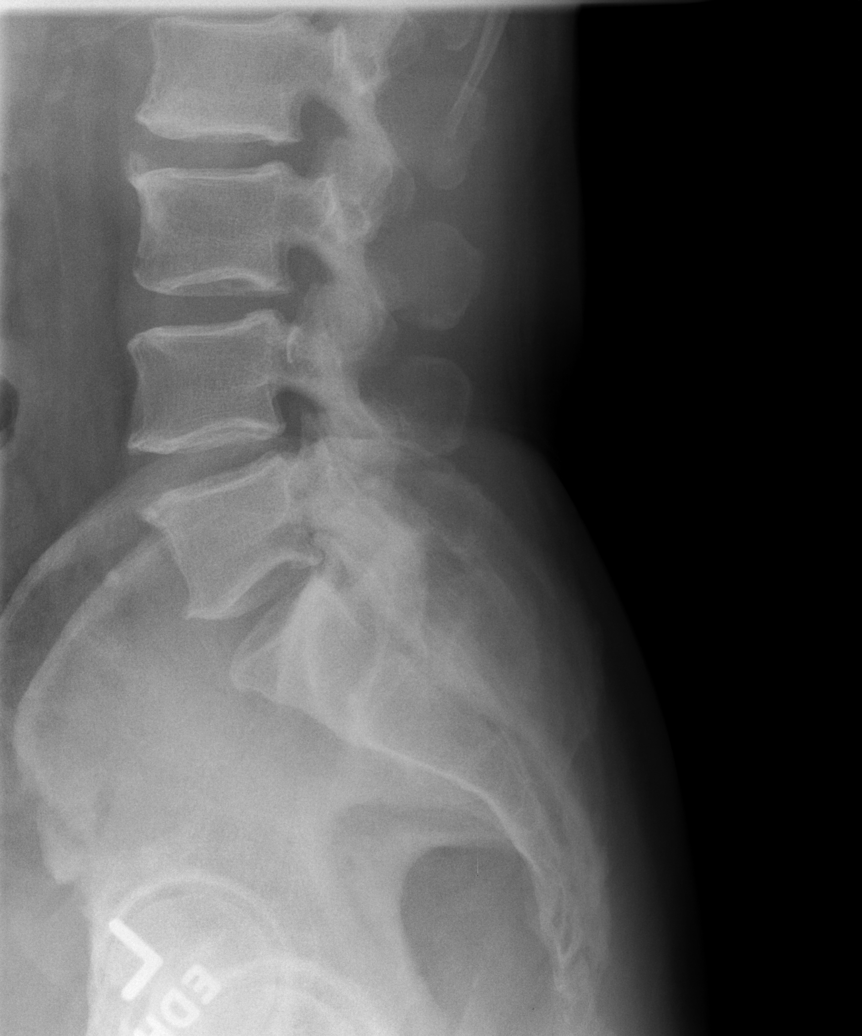

[5 of 5 positions shown; findings below may reference images not displayed]

FINDINGS: There is no significant listhesis. Vertebral body heights are
maintained. There is minor disc space narrowing. Small endplate
osteophytes are present. Lower lumbar facet hypertrophy is noted.
Probable congenital narrowing of the spinal canal.
IMPRESSION: Overall mild degenerative changes of the lumbar spine, greatest at
L5-S1.

## 2021-03-20 ENCOUNTER — Other Ambulatory Visit: Payer: Self-pay

## 2021-03-20 ENCOUNTER — Ambulatory Visit: Payer: Medicare PPO | Attending: Family Medicine

## 2021-03-20 DIAGNOSIS — R2689 Other abnormalities of gait and mobility: Secondary | ICD-10-CM | POA: Diagnosis not present

## 2021-03-20 DIAGNOSIS — M6281 Muscle weakness (generalized): Secondary | ICD-10-CM | POA: Insufficient documentation

## 2021-03-20 DIAGNOSIS — R2681 Unsteadiness on feet: Secondary | ICD-10-CM | POA: Diagnosis not present

## 2021-03-20 NOTE — Therapy (Signed)
[facility] [address] [place], [place], [address] Phone: [phone]   Fax:  [phone]  Physical Therapy Treatment  Patient Details  Name: Alexander Foster MRN: [id number] Date of Birth: Dec 31, 1950 Referring Provider (PT): Lucianne Lei, MD   Encounter Date: 03/20/2021   PT End of Session - 03/20/21 0908     Visit Number 7    Number of Visits 18    Date for PT Re-Evaluation 04/10/21    Authorization Type [employer] medicare so 10th visit progress note. Auth requested    PT Start Time [id number]    PT Stop Time 0928    PT Time Calculation (min) 41 min    Equipment Utilized During Treatment Gait belt    Activity Tolerance Patient tolerated treatment well    Behavior During Therapy WFL for tasks assessed/performed             Past Medical History:  Diagnosis Date   Anemia    Asthma    As a child   Cancer ([place])    seed implant for prostate cancer   Diabetes mellitus without complication ([place])    Hypertension     Past Surgical History:  Procedure Laterality Date   CYSTOSCOPY WITH STENT PLACEMENT Bilateral 01/02/2020   Procedure: CYSTOSCOPY WITH FIREFLY INJECTION;  Surgeon: Ceasar Mons, MD;  Location: [facility];  Service: Urology;  Laterality: Bilateral;   PROSTATE BIOPSY     RADIOACTIVE SEED IMPLANT  2010   TONSILLECTOMY     WISDOM TOOTH EXTRACTION      There were no vitals filed for this visit.   Subjective Assessment - 03/20/21 0850     Subjective Pt has had acute onset of LBP for the past few weeks which is why he has been unable to attend PT.  Pt thinks carrying laundry triggered onset of recent back pain after repeated bouts.    Pertinent History PMH: diverticulitis, frequent UTIs, 01/02/20  XI ROBOT ASSISTED SIGMOIDECTOMY, RIGID PROCTOSCOPY  CYSTOSCOPY WITH FIREFLY INJECTION, HTN, DM2, Anemia, Prostate cancer    Patient Stated Goals Improve stairs and improve overall fitness (endurance,  strenghthen core)    Currently in Pain? Yes    Pain Score 2     Pain Location Back    Pain Orientation Right    Pain Descriptors / Indicators Aching;Sharp    Pain Type Acute pain    Pain Onset 1 to 4 weeks ago    Pain Frequency Intermittent                               [facility] Adult PT Treatment/Exercise - 03/20/21 0853       Transfers   Transfers Sit to Stand;Stand to Sit    Sit to Stand 5: Supervision    Five time sit to stand comments  20.57 sec w/o hands from standard chair    Stand to Sit 5: Supervision      Ambulation/Gait   Ambulation/Gait Yes    Ambulation/Gait Assistance 5: Supervision    Ambulation/Gait Assistance Details laps in gym to promote activity tolerance following core stability exercises    Ambulation Distance (Feet) 230 Feet    Assistive device None    Gait Pattern Step-through pattern    Ambulation Surface Level;Indoor      Neuro Re-ed    Neuro Re-ed Details  In // bars:  pt completes 6 bouts of forward stepping without UE support  across blue mat.  Pt in tandem stance on blue mat performing alt arm raises x5 each side with alt feet placed in front. Pt was cued to engage core throughout.      Exercises   Exercises Other Exercises    Other Exercises  For core stability on mat:  lower trunk rotations x5 each side cued for core engagement, single knee to chest stretch 5x10sec each side, prone press-up on elbows x8. PT assessed PA mobs L3-S1 with pt having tenderness at L5 area central and on right. Supine marching cued for core engagement and pelvic stability throughout ROM x8, simultaneous bilateral bent knee fallouts with TrA activation x10, heels slide with cuing for flattening lumbar spine to the mat during motion x10 each side                     PT Education - 03/20/21 0939     Education Details Pt was instructed to avoid reaching far out to pick something up to protect back and to engage core.    Person(s) Educated  Patient    Methods Explanation    Comprehension Verbalized understanding              PT Short Term Goals - 03/20/21 0921       PT SHORT TERM GOAL #1   Title Pt will be independent with progressive HEP for strengthening (including core), balance and aerobic activity to continue gains on own.    Baseline 03/19/21 Pt just returned since recert so will continue to update.    Time 4    Period Weeks    Status On-going      PT SHORT TERM GOAL #2   Title Pt will decrease 5 x sit to stand from 14.74 sec to <13 sec for improved balance and functional strength.    Baseline 02/13/21 14.74 sec from chair without hands; 03/20/2021 20.57 sec w/o hands from standard chair, acute LBP    Time 4    Period Weeks    Status On-going    Target Date 03/14/21               PT Long Term Goals - 02/14/21 0907       PT LONG TERM GOAL #1   Title Pt will ambulate on varied surfaces >1000' independently with no LOB for improved community mobility.    Time 8    Period Weeks    Status New    Target Date 04/10/21      PT LONG TERM GOAL #2   Title Pt will improve FGA score to >/= 27/30 demonstrating a diminished fall risk.    Baseline 12/24/20- 24/30. 02/13/21 24/30    Time 8    Period Weeks    Status Revised    Target Date 04/10/21                   Plan - 03/20/21 0909     Clinical Impression Statement Pt completed 5xSTS in 20.57 sec which did not meet his goal likely due to acute onset of low back pain.  Focused on core engagement to promote lumbar stability and improved standing balance.    Personal Factors and Comorbidities Comorbidity 3+    Comorbidities PMH: diverticulitis, frequent UTIs, 01/02/20  XI ROBOT ASSISTED SIGMOIDECTOMY, RIGID PROCTOSCOPY  CYSTOSCOPY WITH FIREFLY INJECTION, HTN, DM2, Anemia, Prostate cancer    Examination-Activity Limitations Carry;Lift;Stairs    Examination-Participation Restrictions Cleaning;Valla Leaver [facility]    Stability/Clinical Decision Making  Evolving/Moderate complexity    Rehab Potential Good    PT Frequency 2x / week   followed by 1x/week for 4 weeks   PT Duration 4 weeks    PT Treatment/Interventions ADLs/Self Care Home Management;Aquatic Therapy;Moist Heat;Balance training;Therapeutic exercise;Therapeutic activities;Functional mobility training;Stair training;Gait training;Neuromuscular re-education;Patient/family education;Manual techniques;Energy conservation;Passive range of motion;DME Instruction    PT Next Visit Plan Educate on proper body mechanics and lifting tecniques to protect back. Did well with log roll at visit. Follow-up on additions to HEP and back pain.  Continue to incorporate more core stabilization activities. Continued BLE strengthening. Standing Balance on Complaint surfaces, SLS activities, narrow BOS/tandem.Dynamic Gait    PT Home Exercise Plan [id number]    Consulted and Agree with Plan of Care Patient             Patient will benefit from skilled therapeutic intervention in order to improve the following deficits and impairments:  Abnormal gait, Decreased activity tolerance, Decreased balance, Decreased range of motion, Decreased endurance, Decreased mobility, Decreased strength, Hypomobility, Impaired flexibility, Pain  Visit Diagnosis: Other abnormalities of gait and mobility  Muscle weakness (generalized)  Unsteadiness on feet     Problem List Patient Active Problem List   Diagnosis Date Noted   Diverticular disease 01/02/2020   Diverticulitis of intestine with abscess 05/11/2019   Diverticulitis of large intestine with perforation and abscess without bleeding    Sepsis ([place])    Renal insufficiency    Diverticulitis 09/03/2017   Deficiency anemia 05/08/2013    Electa Sniff, PT, DPT, NCS 03/20/2021, 9:46 AM  [facility] [address] [place], [place], [address] Phone: [phone]   Fax:  [phone]  Name: ANGELINO RUMERY MRN: [id number] Date of Birth: September 11, 1950

## 2021-03-20 NOTE — Patient Instructions (Signed)
Access Code: [email or web address] URL: [email or web address] Date: 03/20/2021 Prepared by: Cherly Anderson  Exercises Standing Hamstring Curl with Resistance - 1 x daily - 5 x weekly - 2 sets - 10 reps Marching with Resistance at Countertop - 1 x daily - 5 x weekly - 2 sets - 10 reps Standing Single Leg Stance with Counter Support - 1 x daily - 5 x weekly - 1 sets - 3-4 reps - 10-15 seconds hold Romberg Stance Eyes Closed on Foam Pad - 1 x daily - 5 x weekly - 1 sets - 3 reps - 30 seconds hold Tandem Stance in Corner - 1 x daily - 5 x weekly - 1 sets - 3 reps - 20-25 seconds hold Hip Abduction with Resistance Loop - 1 x daily - 5 x weekly - 2 sets - 10 reps Hip Extension with Resistance Loop - 1 x daily - 5 x weekly - 2 sets - 10 reps Tandem Walking - 1 x daily - 5 x weekly - 1 sets - 3-4 reps Supine Lower Trunk Rotation - 2 x daily - 5 x weekly - 2 sets - 5 reps Supine Single Knee to Chest Stretch - 2 x daily - 5 x weekly - 2 sets - 5 reps - 20 sec hold Supine March - 1 x daily - 5 x weekly - 2 sets - 10 reps Bent Knee Fallouts - 1 x daily - 5 x weekly - 2 sets - 10 reps Supine Heel Slide - 1 x daily - 5 x weekly - 2 sets - 10 reps

## 2021-03-24 ENCOUNTER — Encounter: Payer: Self-pay | Admitting: Physical Therapy

## 2021-03-24 ENCOUNTER — Ambulatory Visit: Payer: Medicare PPO | Admitting: Physical Therapy

## 2021-03-24 ENCOUNTER — Other Ambulatory Visit: Payer: Self-pay

## 2021-03-24 DIAGNOSIS — M6281 Muscle weakness (generalized): Secondary | ICD-10-CM | POA: Diagnosis not present

## 2021-03-24 DIAGNOSIS — R2689 Other abnormalities of gait and mobility: Secondary | ICD-10-CM | POA: Diagnosis not present

## 2021-03-24 DIAGNOSIS — R2681 Unsteadiness on feet: Secondary | ICD-10-CM | POA: Diagnosis not present

## 2021-03-24 NOTE — Therapy (Signed)
[facility] [address] [place], [place], [address] Phone: [phone]   Fax:  [phone]  Physical Therapy Treatment  Patient Details  Name: Alexander Foster MRN: [id number] Date of Birth: [birth year] Referring Provider (PT): Lucianne Lei, MD   Encounter Date: 03/24/2021   PT End of Session - 03/24/21 1148     Visit Number 8    Number of Visits 18    Date for PT Re-Evaluation 04/10/21    Authorization Type [employer] medicare so 10th visit progress note. Auth requested    PT Start Time 1105    PT Stop Time 1148    PT Time Calculation (min) 43 min    Equipment Utilized During Treatment Gait belt    Activity Tolerance Patient tolerated treatment well    Behavior During Therapy WFL for tasks assessed/performed             Past Medical History:  Diagnosis Date   Anemia    Asthma    As a child   Cancer ([place])    seed implant for prostate cancer   Diabetes mellitus without complication ([place])    Hypertension     Past Surgical History:  Procedure Laterality Date   CYSTOSCOPY WITH STENT PLACEMENT Bilateral 01/02/2020   Procedure: CYSTOSCOPY WITH FIREFLY INJECTION;  Surgeon: Ceasar Mons, MD;  Location: [facility];  Service: Urology;  Laterality: Bilateral;   PROSTATE BIOPSY     RADIOACTIVE SEED IMPLANT  2010   TONSILLECTOMY     WISDOM TOOTH EXTRACTION      There were no vitals filed for this visit.   Subjective Assessment - 03/24/21 1108     Subjective Pt states his back pain resolved over the weekend.  No other complaints today.  He feels the new exercises really helped, especially the single knee to chest stretch.    Pertinent History PMH: diverticulitis, frequent UTIs, 01/02/20  XI ROBOT ASSISTED SIGMOIDECTOMY, RIGID PROCTOSCOPY  CYSTOSCOPY WITH FIREFLY INJECTION, HTN, DM2, Anemia, Prostate cancer    Patient Stated Goals Improve stairs and improve overall fitness (endurance, strenghthen core)     Currently in Pain? No/denies    Pain Onset 1 to 4 weeks ago                               [facility] Adult PT Treatment/Exercise - 03/24/21 1113       Neuro Re-ed    Neuro Re-ed Details  Standing in corner:  feet together EO gold weighted ball raise and lower x8, feet together on pillows x8, cued for core engagement and paced breathing.  Reviewed tandem stance on firm surface from HEP 2x30sec with cues for weight centered on feet with mild improvement in stance on second trial when right foot behind.      Exercises   Exercises Other Exercises    Other Exercises  Core control:  seated posterior pelvic tilts x6 w/ therapist providing facilitation at top of pelvis.  Verbal cues for upright trunk with isolated pelvic movement for core engagement.  Standing with back against wall for external cuing to flatten low back to wall when tightening abdominals x8.  Wall mini squat w/ forward reach 1x5>with gold weighted ball 2x5 , cued for core engagement and shoulder retraction.  Spent last part of session working on lifting technique with small load lifted bilaterally from varying heights: floor, mat, and elevated table at chest height.  Pt demos  inc forward shoulders when lifting from lower height with elbows locked in extension.  Cues for improved core and periscapular mm engagement with load close to core and sequencing of legs and forward lean.                     PT Education - 03/24/21 1300     Education Details Edu provided on lifting techniques when lifting from varied surface heights to protect low back by carrying load close to core and maintaining soft bend in arms while keeping spine flat by engaging TrA.    Person(s) Educated Patient    Methods Explanation    Comprehension Verbalized understanding              PT Short Term Goals - 03/20/21 0921       PT SHORT TERM GOAL #1   Title Pt will be independent with progressive HEP for strengthening (including  core), balance and aerobic activity to continue gains on own.    Baseline 03/19/21 Pt just returned since recert so will continue to update.    Time 4    Period Weeks    Status On-going      PT SHORT TERM GOAL #2   Title Pt will decrease 5 x sit to stand from 14.74 sec to <13 sec for improved balance and functional strength.    Baseline 02/13/21 14.74 sec from chair without hands; 03/20/2021 20.57 sec w/o hands from standard chair, acute LBP    Time 4    Period Weeks    Status On-going    Target Date 03/14/21               PT Long Term Goals - 02/14/21 0907       PT LONG TERM GOAL #1   Title Pt will ambulate on varied surfaces >1000' independently with no LOB for improved community mobility.    Time 8    Period Weeks    Status New    Target Date 04/10/21      PT LONG TERM GOAL #2   Title Pt will improve FGA score to >/= 27/30 demonstrating a diminished fall risk.    Baseline 12/24/20- 24/30. 02/13/21 24/30    Time 8    Period Weeks    Status Revised    Target Date 04/10/21                   Plan - 03/24/21 1302     Clinical Impression Statement Progressed pts session to lifting technique today to build upon previous functional strengthening of core and LE.  Pt requires intermittent cuing to maintain good form and core engagement and would do well with review of lifting technique to simulate home environment in follow-up session.    Personal Factors and Comorbidities Comorbidity 3+    Comorbidities PMH: diverticulitis, frequent UTIs, 01/02/20  XI ROBOT ASSISTED SIGMOIDECTOMY, RIGID PROCTOSCOPY  CYSTOSCOPY WITH FIREFLY INJECTION, HTN, DM2, Anemia, Prostate cancer    Examination-Activity Limitations Carry;Lift;Stairs    Examination-Participation Restrictions Cleaning;Valla Leaver Work;Laundry    Stability/Clinical Decision Making Evolving/Moderate complexity    Rehab Potential Good    PT Frequency 2x / week   followed by 1x/week for 4 weeks   PT Duration 4 weeks    PT  Treatment/Interventions ADLs/Self Care Home Management;Aquatic Therapy;Moist Heat;Balance training;Therapeutic exercise;Therapeutic activities;Functional mobility training;Stair training;Gait training;Neuromuscular re-education;Patient/family education;Manual techniques;Energy conservation;Passive range of motion;DME Instruction    PT Next Visit Plan Review proper body mechanics and lifting  tecniques to protect back-use increased load from varied surface heights.  Practice weighted carry on level surface.  Work on [employer], [employer] w/ and w/o carrying when appropriate.  Emphasize balance without using vision.  Continue to incorporate more core stabilization activities. Continued BLE strengthening. Standing Balance on Complaint surfaces, SLS activities, narrow BOS/tandem.Dynamic Gait    PT Home Exercise Plan [email or web address]    Consulted and Agree with Plan of Care Patient             Patient will benefit from skilled therapeutic intervention in order to improve the following deficits and impairments:  Abnormal gait, Decreased activity tolerance, Decreased balance, Decreased range of motion, Decreased endurance, Decreased mobility, Decreased strength, Hypomobility, Impaired flexibility, Pain  Visit Diagnosis: Other abnormalities of gait and mobility  Muscle weakness (generalized)  Unsteadiness on feet     Problem List Patient Active Problem List   Diagnosis Date Noted   Diverticular disease 01/02/2020   Diverticulitis of intestine with abscess 05/11/2019   Diverticulitis of large intestine with perforation and abscess without bleeding    Sepsis ([place])    Renal insufficiency    Diverticulitis 09/03/2017   Deficiency anemia 05/08/2013    Elease Etienne, PT, DPT 03/24/2021, 1:09 PM  [facility] [address] [place], [place], [address] Phone: [phone]   Fax:  [phone]  Name: Alexander Foster MRN: [id number] Date of  Birth: 17-Mar-1950

## 2021-03-26 ENCOUNTER — Encounter: Payer: Self-pay | Admitting: Physical Therapy

## 2021-03-26 ENCOUNTER — Other Ambulatory Visit: Payer: Self-pay

## 2021-03-26 ENCOUNTER — Ambulatory Visit: Payer: Medicare PPO | Attending: Family Medicine | Admitting: Physical Therapy

## 2021-03-26 DIAGNOSIS — R2689 Other abnormalities of gait and mobility: Secondary | ICD-10-CM | POA: Insufficient documentation

## 2021-03-26 DIAGNOSIS — R2681 Unsteadiness on feet: Secondary | ICD-10-CM | POA: Insufficient documentation

## 2021-03-26 DIAGNOSIS — M6281 Muscle weakness (generalized): Secondary | ICD-10-CM | POA: Insufficient documentation

## 2021-03-26 NOTE — Therapy (Signed)
**Note Alexander-Identified via Obfuscation** [facility] ?[place], [place], [address] ?Phone: [phone]   Fax:  [phone] ? ?Physical Therapy Treatment ? ?Patient Details  ?Name: Alexander Foster ?MRN: [id number] ?Date of Birth: 1950/05/15 ?Referring Provider (PT): Lucianne Lei, MD ? ? ?Encounter Date: 03/26/2021 ? ? PT End of Session - 03/26/21 1626   ? ? Visit Number 9   ? Number of Visits 18   ? Date for PT Re-Evaluation 04/10/21   ? Authorization Type [employer] medicare so 10th visit progress note. Auth requested   ? PT Start Time 1450   ? PT Stop Time 1530   ? PT Time Calculation (min) 40 min   ? Equipment Utilized During Treatment Gait belt   ? Activity Tolerance Patient tolerated treatment well   ? Behavior During Therapy [facility] for tasks assessed/performed   ? ?  ?  ? ?  ? ? ?Past Medical History:  ?Diagnosis Date  ? Anemia   ? Asthma   ? As a child  ? Cancer [facility])   ? seed implant for prostate cancer  ? Diabetes mellitus without complication ([place])   ? Hypertension   ? ? ?Past Surgical History:  ?Procedure Laterality Date  ? CYSTOSCOPY WITH STENT PLACEMENT Bilateral 01/02/2020  ? Procedure: CYSTOSCOPY WITH FIREFLY INJECTION;  Surgeon: Ceasar Mons, MD;  Location: [facility];  Service: Urology;  Laterality: Bilateral;  ? PROSTATE BIOPSY    ? RADIOACTIVE SEED IMPLANT  2010  ? TONSILLECTOMY    ? WISDOM TOOTH EXTRACTION    ? ? ?There were no vitals filed for this visit. ? ? Subjective Assessment - 03/26/21 1453   ? ? Subjective Pt states he went to the grocery store and carried 2 large cases of water which caused some discomfort in low back that went away when he did his HEP.   ? Pertinent History PMH: diverticulitis, frequent UTIs, 01/02/20  XI ROBOT ASSISTED SIGMOIDECTOMY, RIGID PROCTOSCOPY  CYSTOSCOPY WITH FIREFLY INJECTION, HTN, DM2, Anemia, Prostate cancer   ? Patient Stated Goals Improve stairs and improve overall fitness (endurance, strenghthen core)   ? Currently in Pain?  No/denies   ? Pain Onset 1 to 4 weeks ago   ? ?  ?  ? ?  ? ? ? ? ? ? ? ? ? ? ? ? ? ? ? ? ? ? ? ? [place] [facility] Adult PT Treatment/Exercise - 03/26/21 1612   ? ?  ? Neuro Re-ed   ? Neuro Re-ed Details  Pt standing at [facility] performing UE taps to tip of cones on floor in semi-circle 2x5 using alternating UE.  Pt performs unilateral carry w/ 10>15>20lb kettlebell 30' x6.  Cued for postural correction with core engagement and separation of feet to promote normal base of support and balance during manual carry task.  Pt demos increased difficulty correcting lateral lean with 20lb kettlebell.   ?  ? Exercises  ? Exercises Other Exercises   ? Other Exercises  Reviewed standind PPT at wall for external cue 2x10 w/ improved core engagement and overall performance of task today.  Using unilateral rail for support, pt performs 6" stair step ups with opposite knee drive 2x5 each LE for core engagement.  Bilateral 10lb crate carry to simulate home tasks from floor <> chair x8> x5 after carrying crate 30' floor<>chest level table  >using stairs.  Pt demos improved periscapular and core mm engagement and carryover of edu from previous session.  Lower back rollout with  ball x5 in forward, L & R directions.   ? ?  ?  ? ?  ? ? ? ? ? ? ? ? ? ? PT Education - 03/26/21 1625   ? ? Education Details Edu on breathing using sniff through nose for diaphragm engagement to facilitate quicker recovery following intense activity.  Verbally added standing posterior pelvic tilts to HEP as pt stated he did not want printout.  Edu on performance at home with safety against wall.   ? Person(s) Educated Patient   ? Methods Explanation   ? Comprehension Verbalized understanding   ? ?  ?  ? ?  ? ? ? PT Short Term Goals - 03/20/21 0921   ? ?  ? PT SHORT TERM GOAL #1  ? Title Pt will be independent with progressive HEP for strengthening (including core), balance and aerobic activity to continue gains on own.   ? Baseline 03/19/21 Pt just returned since recert so will  continue to update.   ? Time 4   ? Period Weeks   ? Status On-going   ?  ? PT SHORT TERM GOAL #2  ? Title Pt will decrease 5 x sit to stand from 14.74 sec to <13 sec for improved balance and functional strength.   ? Baseline 02/13/21 14.74 sec from chair without hands; 03/20/2021 20.57 sec w/o hands from standard chair, acute LBP   ? Time 4   ? Period Weeks   ? Status On-going   ? Target Date 03/14/21   ? ?  ?  ? ?  ? ? ? ? PT Long Term Goals - 02/14/21 0907   ? ?  ? PT LONG TERM GOAL #1  ? Title Pt will ambulate on varied surfaces >1000' independently with no LOB for improved community mobility.   ? Time 8   ? Period Weeks   ? Status New   ? Target Date 04/10/21   ?  ? PT LONG TERM GOAL #2  ? Title Pt will improve FGA score to >/= 27/30 demonstrating a diminished fall risk.   ? Baseline 12/24/20- 24/30. 02/13/21 24/30   ? Time 8   ? Period Weeks   ? Status Revised   ? Target Date 04/10/21   ? ?  ?  ? ?  ? ? ? ? ? ? ? ? Plan - 03/26/21 1631   ? ? Clinical Impression Statement Today's session progressed to performing functional lift and carry tasks with mild emphasis on static and dynamic balance for carryover in home environment.  Pt demos continued improvement in low back pain and endurance during functional tasks.   ? Personal Factors and Comorbidities Comorbidity 3+   ? Comorbidities PMH: diverticulitis, frequent UTIs, 01/02/20  XI ROBOT ASSISTED SIGMOIDECTOMY, RIGID PROCTOSCOPY  CYSTOSCOPY WITH FIREFLY INJECTION, HTN, DM2, Anemia, Prostate cancer   ? Examination-Activity Limitations Carry;Lift;Stairs   ? Examination-Participation Restrictions Cleaning;Yard Work;Laundry   ? Stability/Clinical Decision Making Evolving/Moderate complexity   ? Rehab Potential Good   ? PT Frequency 2x / week   followed by 1x/week for 4 weeks  ? PT Duration 4 weeks   ? PT Treatment/Interventions ADLs/Self Care Home Management;Aquatic Therapy;Moist Heat;Balance training;Therapeutic exercise;Therapeutic activities;Functional mobility  training;Stair training;Gait training;Neuromuscular re-education;Patient/family education;Manual techniques;Energy conservation;Passive range of motion;DME Instruction   ? PT Next Visit Plan Work on stair management w/ and w/o carrying when appropriate.  Emphasize balance without using vision.  Continue to incorporate more core stabilization activities. Continued BLE strengthening. Standing Balance on  Complaint surfaces, SLS activities, narrow BOS/tandem.Dynamic Gait   ? PT Home Exercise Plan [email or web address]   ? Consulted and Agree with Plan of Care Patient   ? ?  ?  ? ?  ? ? ?Patient will benefit from skilled therapeutic intervention in order to improve the following deficits and impairments:  Abnormal gait, Decreased activity tolerance, Decreased balance, Decreased range of motion, Decreased endurance, Decreased mobility, Decreased strength, Hypomobility, Impaired flexibility, Pain ? ?Visit Diagnosis: ?Other abnormalities of gait and mobility ? ?Muscle weakness (generalized) ? ? ? ? ?Problem List ?Patient Active Problem List  ? Diagnosis Date Noted  ? Diverticular disease 01/02/2020  ? Diverticulitis of intestine with abscess 05/11/2019  ? Diverticulitis of large intestine with perforation and abscess without bleeding   ? Sepsis ([place])   ? Renal insufficiency   ? Diverticulitis 09/03/2017  ? Deficiency anemia 05/08/2013  ? ? ?Bary Richard, PT, DPT ?03/26/2021, 4:34 PM ? ?[facility] ?[place] ?[place], [place], [address] ?Phone: [phone]   Fax:  [phone] ? ?Name: Alexander Foster ?MRN: [id number] ?Date of Birth: 02-21-1950 ? ? ? ?

## 2021-03-31 ENCOUNTER — Ambulatory Visit: Payer: Medicare PPO | Admitting: Physical Therapy

## 2021-03-31 ENCOUNTER — Encounter: Payer: Self-pay | Admitting: Physical Therapy

## 2021-03-31 ENCOUNTER — Other Ambulatory Visit: Payer: Self-pay

## 2021-03-31 DIAGNOSIS — R2689 Other abnormalities of gait and mobility: Secondary | ICD-10-CM | POA: Diagnosis not present

## 2021-03-31 DIAGNOSIS — R2681 Unsteadiness on feet: Secondary | ICD-10-CM | POA: Diagnosis not present

## 2021-03-31 DIAGNOSIS — M6281 Muscle weakness (generalized): Secondary | ICD-10-CM

## 2021-03-31 NOTE — Therapy (Signed)
[address] ?[place] ?[place], [place], [address] ?Phone: [phone]   Fax:  [phone] ? ?Physical Therapy Treatment/Progress Note ?PT progress note for Alexander Foster. ? ?Reporting period 12/24/2021 to 03/31/2021 ? ?See Note below for Objective Data and Assessment of Progress/Goals ? ?Thank you for the referral of this patient. ?Elease Etienne, PT, DPT ? ? ?Patient Details  ?Name: Alexander Foster ?MRN: [id number] ?Date of Birth: 1950-05-09 ?Referring Provider (PT): Lucianne Lei, MD ? ? ?Encounter Date: 03/31/2021 ? ? PT End of Session - 03/31/21 1308   ? ? Visit Number 10   ? Number of Visits 18   ? Date for PT Re-Evaluation 04/10/21   ? Authorization Type [employer] medicare so 10th visit progress note. Auth requested   ? PT Start Time 1106   ? PT Stop Time 1145   ? PT Time Calculation (min) 39 min   ? Equipment Utilized During Treatment Gait belt   ? Activity Tolerance Patient tolerated treatment well   ? Behavior During Therapy [facility] for tasks assessed/performed   ? ?  ?  ? ?  ? ? ?Past Medical History:  ?Diagnosis Date  ? Anemia   ? Asthma   ? As a child  ? Cancer [facility])   ? seed implant for prostate cancer  ? Diabetes mellitus without complication ([place])   ? Hypertension   ? ? ?Past Surgical History:  ?Procedure Laterality Date  ? CYSTOSCOPY WITH STENT PLACEMENT Bilateral 01/02/2020  ? Procedure: CYSTOSCOPY WITH FIREFLY INJECTION;  Surgeon: Ceasar Mons, MD;  Location: [facility];  Service: Urology;  Laterality: Bilateral;  ? PROSTATE BIOPSY    ? RADIOACTIVE SEED IMPLANT  2010  ? TONSILLECTOMY    ? WISDOM TOOTH EXTRACTION    ? ? ?There were no vitals filed for this visit. ? ? Subjective Assessment - 03/31/21 1106   ? ? Subjective Pt states he is having mild LBP today and that his activity has not changed outside of therapy so unsure why it's there.  He is dealing with a lot of stress from his job currently.  He has been trying to walk more for  relaxation.   ? Pertinent History PMH: diverticulitis, frequent UTIs, 01/02/20  XI ROBOT ASSISTED SIGMOIDECTOMY, RIGID PROCTOSCOPY  CYSTOSCOPY WITH FIREFLY INJECTION, HTN, DM2, Anemia, Prostate cancer   ? Patient Stated Goals Improve stairs and improve overall fitness (endurance, strenghthen core)   ? Currently in Pain? No/denies   ? Pain Orientation Lower;Mid   ? Pain Descriptors / Indicators Aching   ? Pain Type Chronic pain   ? Pain Onset 1 to 4 weeks ago   ? Pain Frequency Intermittent   ? ?  ?  ? ?  ? ? ? ? ? ? ? ? ? ? ? ? ? ? ? ? ? ? ? ? [facility] Adult PT Treatment/Exercise - 03/31/21 1111   ? ?  ? Neuro Re-ed   ? Neuro Re-ed Details  Pt standing with alt LE elevated 12" x30 sec each leg.  Pt standing on blue soft walking mat folded over.  Facing trampoline with normal BOS tossing red 1lb ball and catching> wide semi-tandem alt leading foot> narrow BOS; forward and side stepping over hurdles of varying sizes and distances apart 2 bouts each.  Single arm carry 12 lbs up and down 6" stairs unilateral rail for support> no UE support; x6 trials total   ?  ? Knee/Hip Exercises: Stretches  ? Active  Hamstring Stretch Both;2 reps;30 seconds   ? Other Knee/Hip Stretches single knee to chest stretch bilaterally 2x30 sec each side in supine   ? Other Knee/Hip Stretches Low back rollouts with blue physioball 2x45sec w/ cuing for neck in line with spine and paced breathing   ? ?  ?  ? ?  ? ? ? ? ? ? ? ? ? ? ? ? PT Short Term Goals - 03/20/21 0921   ? ?  ? PT SHORT TERM GOAL #1  ? Title Pt will be independent with progressive HEP for strengthening (including core), balance and aerobic activity to continue gains on own.   ? Baseline 03/19/21 Pt just returned since recert so will continue to update.   ? Time 4   ? Period Weeks   ? Status On-going   ?  ? PT SHORT TERM GOAL #2  ? Title Pt will decrease 5 x sit to stand from 14.74 sec to <13 sec for improved balance and functional strength.   ? Baseline 02/13/21 14.74 sec from chair  without hands; 03/20/2021 20.57 sec w/o hands from standard chair, acute LBP   ? Time 4   ? Period Weeks   ? Status On-going   ? Target Date 03/14/21   ? ?  ?  ? ?  ? ? ? ? PT Long Term Goals - 02/14/21 0907   ? ?  ? PT LONG TERM GOAL #1  ? Title Pt will ambulate on varied surfaces >1000' independently with no LOB for improved community mobility.   ? Time 8   ? Period Weeks   ? Status New   ? Target Date 04/10/21   ?  ? PT LONG TERM GOAL #2  ? Title Pt will improve FGA score to >/= 27/30 demonstrating a diminished fall risk.   ? Baseline 12/24/20- 24/30. 02/13/21 24/30   ? Time 8   ? Period Weeks   ? Status Revised   ? Target Date 04/10/21   ? ?  ?  ? ?  ? ? ? ? ? ? ? ? Plan - 03/31/21 1309   ? ? Clinical Impression Statement Progressed treatment to incorporate stretching and core strengthening into balance regimen.  Pt challenged in static and dynamic balance today with improvement of pain throughout session. He continues to progress towards goals for functional mobility.   ? Personal Factors and Comorbidities Comorbidity 3+   ? Comorbidities PMH: diverticulitis, frequent UTIs, 01/02/20  XI ROBOT ASSISTED SIGMOIDECTOMY, RIGID PROCTOSCOPY  CYSTOSCOPY WITH FIREFLY INJECTION, HTN, DM2, Anemia, Prostate cancer   ? Examination-Activity Limitations Carry;Lift;Stairs   ? Examination-Participation Restrictions Cleaning;Yard Work;Laundry   ? Stability/Clinical Decision Making Evolving/Moderate complexity   ? Rehab Potential Good   ? PT Frequency 2x / week   followed by 1x/week for 4 weeks  ? PT Duration 4 weeks   ? PT Treatment/Interventions ADLs/Self Care Home Management;Aquatic Therapy;Moist Heat;Balance training;Therapeutic exercise;Therapeutic activities;Functional mobility training;Stair training;Gait training;Neuromuscular re-education;Patient/family education;Manual techniques;Energy conservation;Passive range of motion;DME Instruction   ? PT Next Visit Plan Work on stair management w/ and w/o bilateral carrying when  appropriate.  Emphasize balance without using vision.  Continue to incorporate more core stabilization activities. Continued BLE strengthening. Standing Balance on Complaint surfaces, SLS activities, narrow BOS/tandem.Dynamic Gait   ? PT Home Exercise Plan [id number]   ? Consulted and Agree with Plan of Care Patient   ? ?  ?  ? ?  ? ? ?Patient will benefit from skilled therapeutic intervention  in order to improve the following deficits and impairments:  Abnormal gait, Decreased activity tolerance, Decreased balance, Decreased range of motion, Decreased endurance, Decreased mobility, Decreased strength, Hypomobility, Impaired flexibility, Pain ? ?Visit Diagnosis: ?Other abnormalities of gait and mobility ? ?Muscle weakness (generalized) ? ?Unsteadiness on feet ? ? ? ? ?Problem List ?Patient Active Problem List  ? Diagnosis Date Noted  ? Diverticular disease 01/02/2020  ? Diverticulitis of intestine with abscess 05/11/2019  ? Diverticulitis of large intestine with perforation and abscess without bleeding   ? Sepsis ([place])   ? Renal insufficiency   ? Diverticulitis 09/03/2017  ? Deficiency anemia 05/08/2013  ? ? ?Bary Richard, PT, DPT ?03/31/2021, 3:32 PM ? ?[facility] [address] ?[place], [place], [address] ?Phone: [phone]   Fax:  [phone] ? ?Name: Alexander Foster ?MRN: [id number] ?Date of Birth: [birth year] ? ? ? ?

## 2021-04-02 ENCOUNTER — Ambulatory Visit: Payer: Medicare PPO | Admitting: Physical Therapy

## 2021-04-02 ENCOUNTER — Other Ambulatory Visit: Payer: Self-pay

## 2021-04-02 ENCOUNTER — Encounter: Payer: Self-pay | Admitting: Physical Therapy

## 2021-04-02 DIAGNOSIS — R2689 Other abnormalities of gait and mobility: Secondary | ICD-10-CM | POA: Diagnosis not present

## 2021-04-02 DIAGNOSIS — R2681 Unsteadiness on feet: Secondary | ICD-10-CM | POA: Diagnosis not present

## 2021-04-02 DIAGNOSIS — M6281 Muscle weakness (generalized): Secondary | ICD-10-CM | POA: Diagnosis not present

## 2021-04-02 NOTE — Therapy (Signed)
[facility] ?[address] ?[place], [place], [address] ?Phone: [phone]   Fax:  [phone] ? ?Physical Therapy Treatment ? ?Patient Details  ?Name: Alexander Foster ?MRN: [id number] ?Date of Birth: July 19, 1950 ?Referring Provider (PT): Lucianne Lei, MD ? ? ?Encounter Date: 04/02/2021 ? ? PT End of Session - 04/02/21 1151   ? ? Visit Number 11   ? Number of Visits 18   ? Date for PT Re-Evaluation 04/10/21   ? Authorization Type [employer] medicare so 10th visit progress note. Auth requested   ? PT Start Time 1110   ? PT Stop Time 1151   ? PT Time Calculation (min) 41 min   ? Equipment Utilized During Treatment Gait belt   ? Activity Tolerance Patient tolerated treatment well   ? Behavior During Therapy [facility] for tasks assessed/performed   ? ?  ?  ? ?  ? ? ?Past Medical History:  ?Diagnosis Date  ? Anemia   ? Asthma   ? As a child  ? Cancer [facility])   ? seed implant for prostate cancer  ? Diabetes mellitus without complication ([place])   ? Hypertension   ? ? ?Past Surgical History:  ?Procedure Laterality Date  ? CYSTOSCOPY WITH STENT PLACEMENT Bilateral 01/02/2020  ? Procedure: CYSTOSCOPY WITH FIREFLY INJECTION;  Surgeon: Ceasar Mons, MD;  Location: [facility];  Service: Urology;  Laterality: Bilateral;  ? PROSTATE BIOPSY    ? RADIOACTIVE SEED IMPLANT  2010  ? TONSILLECTOMY    ? WISDOM TOOTH EXTRACTION    ? ? ?There were no vitals filed for this visit. ? ? Subjective Assessment - 04/02/21 1112   ? ? Subjective Pt states his LBP has resolved, he is still walking to deal with stress at work which is getting better.   ? Pertinent History PMH: diverticulitis, frequent UTIs, 01/02/20  XI ROBOT ASSISTED SIGMOIDECTOMY, RIGID PROCTOSCOPY  CYSTOSCOPY WITH FIREFLY INJECTION, HTN, DM2, Anemia, Prostate cancer   ? Patient Stated Goals Improve stairs and improve overall fitness (endurance, strenghthen core)   ? Currently in Pain? No/denies   ? Pain Onset 1 to 4 weeks ago   ? ?   ?  ? ?  ? ? ? ? ? ? ? ? ? ? ? ? ? ? ? ? ? ? ? ? [facility] Adult PT Treatment/Exercise - 04/02/21 1118   ? ?  ? Ambulation/Gait  ? Ambulation/Gait Yes   ? Ambulation/Gait Assistance 5: Supervision   ? Ambulation/Gait Assistance Details Pt ambulates w/ supervision 920' w/o AD, cued for separation of feet to prevent medial heel whip in to midline.  Stopped when pt became winded at 8 laps.  SpO2 99%, HR 114, resumed activity with HR under 100 bpm <26mns later.   ? Ambulation Distance (Feet) 920 Feet   ? Assistive device None   ? Gait Pattern Step-through pattern   ? Ambulation Surface Level;Indoor   ?  ? Neuro Re-ed   ? Neuro Re-ed Details  Going upstairs without use of rails x2 bouts> alt unilateral carry up and down 6 in stairs x4 bouts no opp UE support>bilateral empty crate carry to challenge dynamic balance without visualization of foot placement.  No LOB.  Forward carry 115' level surface>backwards 10lb crate carry x2bouts> 2bouts lateral stepping w/ 10lb crate carry.  Pt walks 170' tossing purple ball in air for dual tasking.  Progressed to toss ball against wall with simultaneous side stepping.  SpO2 98%, HR 112 bpm following  activity, recovery <103mn to 104bpm.   ?  ? Exercises  ? Exercises Other Exercises   ? Other Exercises  Pt performs overhead press with 5lb kettlebell with single UE then proceeds into STS 4x5 using alt UE.  HR 100 following activity, 2 mins recovery to 90bpm.   ? ?  ?  ? ?  ? ? ? ? ? ? ? ? ? ? ? ? PT Short Term Goals - 03/20/21 0921   ? ?  ? PT SHORT TERM GOAL #1  ? Title Pt will be independent with progressive HEP for strengthening (including core), balance and aerobic activity to continue gains on own.   ? Baseline 03/19/21 Pt just returned since recert so will continue to update.   ? Time 4   ? Period Weeks   ? Status On-going   ?  ? PT SHORT TERM GOAL #2  ? Title Pt will decrease 5 x sit to stand from 14.74 sec to <13 sec for improved balance and functional strength.   ? Baseline 02/13/21  14.74 sec from chair without hands; 03/20/2021 20.57 sec w/o hands from standard chair, acute LBP   ? Time 4   ? Period Weeks   ? Status On-going   ? Target Date 03/14/21   ? ?  ?  ? ?  ? ? ? ? PT Long Term Goals - 02/14/21 0907   ? ?  ? PT LONG TERM GOAL #1  ? Title Pt will ambulate on varied surfaces >1000' independently with no LOB for improved community mobility.   ? Time 8   ? Period Weeks   ? Status New   ? Target Date 04/10/21   ?  ? PT LONG TERM GOAL #2  ? Title Pt will improve FGA score to >/= 27/30 demonstrating a diminished fall risk.   ? Baseline 12/24/20- 24/30. 02/13/21 24/30   ? Time 8   ? Period Weeks   ? Status Revised   ? Target Date 04/10/21   ? ?  ?  ? ?  ? ? ? ? ? ? ? ? Plan - 04/02/21 1152   ? ? Clinical Impression Statement Focused treatment on endurance and balance training with functional mobility that transfers to home environment.  Pt is making progress towards ambulation goals and activity tolerance.   ? Personal Factors and Comorbidities Comorbidity 3+   ? Comorbidities PMH: diverticulitis, frequent UTIs, 01/02/20  XI ROBOT ASSISTED SIGMOIDECTOMY, RIGID PROCTOSCOPY  CYSTOSCOPY WITH FIREFLY INJECTION, HTN, DM2, Anemia, Prostate cancer   ? Examination-Activity Limitations Carry;Lift;Stairs   ? Examination-Participation Restrictions Cleaning;Yard Work;Laundry   ? Stability/Clinical Decision Making Evolving/Moderate complexity   ? Rehab Potential Good   ? PT Frequency 2x / week   followed by 1x/week for 4 weeks  ? PT Duration 4 weeks   ? PT Treatment/Interventions ADLs/Self Care Home Management;Aquatic Therapy;Moist Heat;Balance training;Therapeutic exercise;Therapeutic activities;Functional mobility training;Stair training;Gait training;Neuromuscular re-education;Patient/family education;Manual techniques;Energy conservation;Passive range of motion;DME Instruction   ? PT Next Visit Plan Work on stair management w/ and w/o bilateral carrying when appropriate.  Emphasize balance without using  vision.  Continue to incorporate more core stabilization activities. Continued BLE strengthening. Standing Balance on Complaint surfaces, SLS activities, narrow BOS/tandem.Dynamic Gait   ? PT Home Exercise Plan [id number]  ? Consulted and Agree with Plan of Care Patient   ? ?  ?  ? ?  ? ? ?Patient will benefit from skilled therapeutic intervention in order to improve the following  deficits and impairments:  Abnormal gait, Decreased activity tolerance, Decreased balance, Decreased range of motion, Decreased endurance, Decreased mobility, Decreased strength, Hypomobility, Impaired flexibility, Pain ? ?Visit Diagnosis: ?Muscle weakness (generalized) ? ? ? ? ?Problem List ?Patient Active Problem List  ? Diagnosis Date Noted  ? Diverticular disease 01/02/2020  ? Diverticulitis of intestine with abscess 05/11/2019  ? Diverticulitis of large intestine with perforation and abscess without bleeding   ? Sepsis ([place])   ? Renal insufficiency   ? Diverticulitis 09/03/2017  ? Deficiency anemia 05/08/2013  ? ? ?Bary Richard, PT, DPT ?04/02/2021, 1:51 PM ? ?[facility] ?[place] [address] ?[place], [place], [address] ?Phone: [phone]   Fax:  [phone] ? ?Name: Alexander Foster ?MRN: [id number] ?Date of Birth: April 20, 1950 ? ? ? ?

## 2021-04-07 ENCOUNTER — Encounter: Payer: Self-pay | Admitting: Physical Therapy

## 2021-04-07 ENCOUNTER — Other Ambulatory Visit: Payer: Self-pay

## 2021-04-07 ENCOUNTER — Ambulatory Visit: Payer: Medicare PPO | Admitting: Physical Therapy

## 2021-04-07 DIAGNOSIS — R2681 Unsteadiness on feet: Secondary | ICD-10-CM

## 2021-04-07 DIAGNOSIS — R2689 Other abnormalities of gait and mobility: Secondary | ICD-10-CM

## 2021-04-07 DIAGNOSIS — M6281 Muscle weakness (generalized): Secondary | ICD-10-CM | POA: Diagnosis not present

## 2021-04-07 NOTE — Therapy (Signed)
[facility] [address] [place], [place], [address] Phone: [phone]   Fax:  [phone]  Physical Therapy Treatment-Discharge Summary PHYSICAL THERAPY DISCHARGE SUMMARY  Visits from Start of Care: 12  Current functional level related to goals / functional outcomes: See clinical assessment below.   Remaining deficits: Mildly narrowed BOS during dynamic activity, core weakness   Education / Equipment: Edu provided on discharge and finalized HEP.   Patient agrees to discharge. Patient goals were met. Patient is being discharged due to  meeting rehab goals and requesting to discharge early due to scheduling conflicts.   Patient Details  Name: Alexander Foster MRN: [id number] Date of Birth: 1950-09-20 Referring Provider (PT): Renaye Rakers, MD   Encounter Date: 04/07/2021   PT End of Session - 04/07/21 1154     Visit Number 12    Number of Visits 18    Date for PT Re-Evaluation 04/10/21    Authorization Type [employer] medicare so 10th visit progress note. Auth requested    PT Start Time 1103    PT Stop Time 1150    PT Time Calculation (min) 47 min    Equipment Utilized During Treatment Gait belt    Activity Tolerance Patient tolerated treatment well    Behavior During Therapy [facility] for tasks assessed/performed             Past Medical History:  Diagnosis Date   Anemia    Asthma    As a child   Cancer ([facility])    seed implant for prostate cancer   Diabetes mellitus without complication ([facility])    Hypertension     Past Surgical History:  Procedure Laterality Date   CYSTOSCOPY WITH STENT PLACEMENT Bilateral 01/02/2020   Procedure: CYSTOSCOPY WITH FIREFLY INJECTION;  Surgeon: Rene Paci, MD;  Location: [facility];  Service: Urology;  Laterality: Bilateral;   PROSTATE BIOPSY     RADIOACTIVE SEED IMPLANT  2010   TONSILLECTOMY     WISDOM TOOTH EXTRACTION      There were no vitals filed for this  visit.   Subjective Assessment - 04/07/21 1106     Subjective LBP is still resolved, he is still dealing with work stress by walking and HEP.    Pertinent History PMH: diverticulitis, frequent UTIs, 01/02/20  XI ROBOT ASSISTED SIGMOIDECTOMY, RIGID PROCTOSCOPY  CYSTOSCOPY WITH FIREFLY INJECTION, HTN, DM2, Anemia, Prostate cancer    Patient Stated Goals Improve stairs and improve overall fitness (endurance, strenghthen core)    Currently in Pain? No/denies    Pain Onset 1 to 4 weeks ago                [facility] PT Assessment - 04/07/21 1118       Functional Gait  Assessment   Gait assessed  Yes    Gait Level Surface Walks 20 ft in less than 5.5 sec, no assistive devices, good speed, no evidence for imbalance, normal gait pattern, deviates no more than 6 in outside of the 12 in walkway width.    Change in Gait Speed Able to smoothly change walking speed without loss of balance or gait deviation. Deviate no more than 6 in outside of the 12 in walkway width.    Gait with Horizontal Head Turns Performs head turns smoothly with no change in gait. Deviates no more than 6 in outside 12 in walkway width    Gait with Vertical Head Turns Performs head turns with no change in gait. Deviates no  more than 6 in outside 12 in walkway width.    Gait and Pivot Turn Pivot turns safely within 3 sec and stops quickly with no loss of balance.    Step Over Obstacle Is able to step over 2 stacked shoe boxes taped together (9 in total height) without changing gait speed. No evidence of imbalance.    Gait with Narrow Base of Support Is able to ambulate for 10 steps heel to toe with no staggering.    Gait with Eyes Closed Walks 20 ft, no assistive devices, good speed, no evidence of imbalance, normal gait pattern, deviates no more than 6 in outside 12 in walkway width. Ambulates 20 ft in less than 7 sec.    Ambulating Backwards Walks 20 ft, uses assistive device, slower speed, mild gait deviations, deviates 6-10 in  outside 12 in walkway width.    Steps Alternating feet, no rail.    Total Score 29                                    PT Education - 04/07/21 1153     Education Details Edu provided on discharge today and to contact if any changes occur in the future to be seen again.  Edu on finalized HEP handout with safety and progressions as needed.  Provided resistance bands for pt use.    Person(s) Educated Patient    Methods Explanation;Handout;Demonstration    Comprehension Verbalized understanding              PT Short Term Goals - 03/20/21 0921       PT SHORT TERM GOAL #1   Title Pt will be independent with progressive HEP for strengthening (including core), balance and aerobic activity to continue gains on own.    Baseline 03/19/21 Pt just returned since recert so will continue to update.    Time 4    Period Weeks    Status On-going      PT SHORT TERM GOAL #2   Title Pt will decrease 5 x sit to stand from 14.74 sec to <13 sec for improved balance and functional strength.    Baseline 02/13/21 14.74 sec from chair without hands; 03/20/2021 20.57 sec w/o hands from standard chair, acute LBP    Time 4    Period Weeks    Status On-going    Target Date 03/14/21               PT Long Term Goals - 04/07/21 1110       PT LONG TERM GOAL #1   Title Pt will ambulate on varied surfaces >1000' independently with no LOB for improved community mobility.    Baseline 04/07/2021 pt ambulates >1150' +500' on grass, unlevel sidewalk, gravel, and mulch independently with no LOB w/ good arm swing and BOS, no cuing from therapist.    Time 8    Period Weeks    Status Achieved    Target Date 04/10/21      PT LONG TERM GOAL #2   Title Pt will improve FGA score to >/= 27/30 demonstrating a diminished fall risk.    Baseline 12/24/20- 24/30. 02/13/21 24/30.  04/07/2021 29/30.    Time 8    Period Weeks    Status Achieved    Target Date 04/10/21                    Plan -  04/07/21 1137     Clinical Impression Statement Pt requesting discharge today due to feeling he is doing well and his work schedule is filling up.  PT reviewed LTGs with pt exceeding both his FGA goal with a score of 29/30 and ambulating independently on varied surfaces including unlevel concrete, grass, mulch, and gravel for 1150'+500'.  Pt has been able to manage low back pain and improve dynamic balance with functional tasks mirroring tasks from home environment throughout sessions.  Therapist finalized his HEP with review during session to promote maintainence of gains outside clinic.  At this time, pt goals are adequate for discharge from this episode of care.    Personal Factors and Comorbidities Comorbidity 3+    Comorbidities PMH: diverticulitis, frequent UTIs, 01/02/20  XI ROBOT ASSISTED SIGMOIDECTOMY, RIGID PROCTOSCOPY  CYSTOSCOPY WITH FIREFLY INJECTION, HTN, DM2, Anemia, Prostate cancer    Examination-Activity Limitations Carry;Lift;Stairs    Examination-Participation Restrictions Cleaning;Pincus Badder Work;Laundry    Stability/Clinical Decision Making Evolving/Moderate complexity    Rehab Potential Good    PT Frequency 2x / week   followed by 1x/week for 4 weeks   PT Duration 4 weeks    PT Treatment/Interventions ADLs/Self Care Home Management;Aquatic Therapy;Moist Heat;Balance training;Therapeutic exercise;Therapeutic activities;Functional mobility training;Stair training;Gait training;Neuromuscular re-education;Patient/family education;Manual techniques;Energy conservation;Passive range of motion;DME Instruction    PT Next Visit Plan --    PT Home Exercise Plan [email or web address]    Consulted and Agree with Plan of Care Patient             Patient will benefit from skilled therapeutic intervention in order to improve the following deficits and impairments:  Abnormal gait, Decreased activity tolerance, Decreased balance, Decreased range of motion, Decreased endurance,  Decreased mobility, Decreased strength, Hypomobility, Impaired flexibility, Pain  Visit Diagnosis: Muscle weakness (generalized)  Other abnormalities of gait and mobility  Unsteadiness on feet     Problem List Patient Active Problem List   Diagnosis Date Noted   Diverticular disease 01/02/2020   Diverticulitis of intestine with abscess 05/11/2019   Diverticulitis of large intestine with perforation and abscess without bleeding    Sepsis ([facility])    Renal insufficiency    Diverticulitis 09/03/2017   Deficiency anemia 05/08/2013    Sadie Haber, PT, DPT 04/07/2021, 1:02 PM  [place] [facility] [address] [place], [place], [address] Phone: [phone]   Fax:  [phone]  Name: Alexander Foster MRN: [id number] Date of Birth: Nov 30, 1950

## 2021-04-07 NOTE — Patient Instructions (Addendum)
Access Code: [email or web address] ?URL: [email or web address] ?Date: 04/07/2021 ?Prepared by: Elease Etienne ? ?Exercises ?Seated Long Arc Quad - 2 x daily - 5 x weekly - 1-2 sets - 10 reps ?Seated Hip Adduction Isometrics with Ball - 2 x daily - 5 x weekly - 2 sets - 5 reps ?Seated Hip Abduction with Resistance - 2 x daily - 5 x weekly - 2 sets - 10 reps ?Sit to Stand with Armchair - 2 x daily - 5 x weekly - 1 sets - 5 reps ?Standing March with Counter Support - 2 x daily - 5 x weekly - 1 sets - 5 reps ?Side Stepping with Counter Support - 1 x daily - 5 x weekly - 4 sets ?Backward Walking with Counter Support - 1 x daily - 5 x weekly - 4 sets ?Sit to Stand with Resistance Around Legs - 1 x daily - 3 x weekly - 2 sets - 8 reps ?Tandem Walking - 1 x daily - 7 x weekly - 3 sets - 10 reps ?Standing Anti-Rotation Press with Anchored Resistance - 1 x daily - 7 x weekly - 3 sets - 10 reps-black theraband ?Anti-Rotation Sidestepping with Resistance - 1 x daily - 7 x weekly - 3 sets - 10 reps-black theraband, edu on progression with long lever arm ?Forward Monster Walks - 1 x daily - 7 x weekly - 3 sets - 10 reps-green theraband at ankle height ? ?

## 2021-04-09 ENCOUNTER — Ambulatory Visit: Payer: Medicare PPO | Admitting: Physical Therapy

## 2021-07-23 DIAGNOSIS — E785 Hyperlipidemia, unspecified: Secondary | ICD-10-CM | POA: Diagnosis not present

## 2021-07-23 DIAGNOSIS — I10 Essential (primary) hypertension: Secondary | ICD-10-CM | POA: Diagnosis not present

## 2021-07-23 DIAGNOSIS — E1169 Type 2 diabetes mellitus with other specified complication: Secondary | ICD-10-CM | POA: Diagnosis not present

## 2021-07-27 DIAGNOSIS — E782 Mixed hyperlipidemia: Secondary | ICD-10-CM | POA: Diagnosis not present

## 2021-07-27 DIAGNOSIS — F17210 Nicotine dependence, cigarettes, uncomplicated: Secondary | ICD-10-CM | POA: Diagnosis not present

## 2021-07-27 DIAGNOSIS — E1169 Type 2 diabetes mellitus with other specified complication: Secondary | ICD-10-CM | POA: Diagnosis not present

## 2021-07-27 DIAGNOSIS — I10 Essential (primary) hypertension: Secondary | ICD-10-CM | POA: Diagnosis not present

## 2021-08-03 DIAGNOSIS — R5383 Other fatigue: Secondary | ICD-10-CM | POA: Diagnosis not present

## 2021-08-03 DIAGNOSIS — I10 Essential (primary) hypertension: Secondary | ICD-10-CM | POA: Diagnosis not present

## 2021-08-03 DIAGNOSIS — E1169 Type 2 diabetes mellitus with other specified complication: Secondary | ICD-10-CM | POA: Diagnosis not present

## 2021-08-17 DIAGNOSIS — E1169 Type 2 diabetes mellitus with other specified complication: Secondary | ICD-10-CM | POA: Diagnosis not present

## 2021-08-20 DIAGNOSIS — E291 Testicular hypofunction: Secondary | ICD-10-CM | POA: Diagnosis not present

## 2021-08-26 DIAGNOSIS — N5201 Erectile dysfunction due to arterial insufficiency: Secondary | ICD-10-CM | POA: Diagnosis not present

## 2021-08-26 DIAGNOSIS — R3912 Poor urinary stream: Secondary | ICD-10-CM | POA: Diagnosis not present

## 2021-08-26 DIAGNOSIS — E291 Testicular hypofunction: Secondary | ICD-10-CM | POA: Diagnosis not present

## 2021-08-26 DIAGNOSIS — N401 Enlarged prostate with lower urinary tract symptoms: Secondary | ICD-10-CM | POA: Diagnosis not present

## 2021-11-05 DIAGNOSIS — E1169 Type 2 diabetes mellitus with other specified complication: Secondary | ICD-10-CM | POA: Diagnosis not present

## 2021-11-06 DIAGNOSIS — I10 Essential (primary) hypertension: Secondary | ICD-10-CM | POA: Diagnosis not present

## 2021-11-06 DIAGNOSIS — E785 Hyperlipidemia, unspecified: Secondary | ICD-10-CM | POA: Diagnosis not present

## 2021-11-06 DIAGNOSIS — E1169 Type 2 diabetes mellitus with other specified complication: Secondary | ICD-10-CM | POA: Diagnosis not present

## 2021-12-14 DIAGNOSIS — N189 Chronic kidney disease, unspecified: Secondary | ICD-10-CM | POA: Diagnosis not present

## 2021-12-14 DIAGNOSIS — E1169 Type 2 diabetes mellitus with other specified complication: Secondary | ICD-10-CM | POA: Diagnosis not present

## 2021-12-14 DIAGNOSIS — E782 Mixed hyperlipidemia: Secondary | ICD-10-CM | POA: Diagnosis not present

## 2021-12-14 DIAGNOSIS — I10 Essential (primary) hypertension: Secondary | ICD-10-CM | POA: Diagnosis not present

## 2022-02-03 DIAGNOSIS — E1169 Type 2 diabetes mellitus with other specified complication: Secondary | ICD-10-CM | POA: Diagnosis not present

## 2022-03-12 DIAGNOSIS — R3915 Urgency of urination: Secondary | ICD-10-CM | POA: Diagnosis not present

## 2022-03-12 DIAGNOSIS — R351 Nocturia: Secondary | ICD-10-CM | POA: Diagnosis not present

## 2022-03-12 DIAGNOSIS — R8279 Other abnormal findings on microbiological examination of urine: Secondary | ICD-10-CM | POA: Diagnosis not present

## 2022-03-12 DIAGNOSIS — R350 Frequency of micturition: Secondary | ICD-10-CM | POA: Diagnosis not present

## 2022-03-15 DIAGNOSIS — E782 Mixed hyperlipidemia: Secondary | ICD-10-CM | POA: Diagnosis not present

## 2022-03-15 DIAGNOSIS — I10 Essential (primary) hypertension: Secondary | ICD-10-CM | POA: Diagnosis not present

## 2022-03-15 DIAGNOSIS — E1169 Type 2 diabetes mellitus with other specified complication: Secondary | ICD-10-CM | POA: Diagnosis not present

## 2022-03-15 DIAGNOSIS — N189 Chronic kidney disease, unspecified: Secondary | ICD-10-CM | POA: Diagnosis not present

## 2022-03-26 DIAGNOSIS — N401 Enlarged prostate with lower urinary tract symptoms: Secondary | ICD-10-CM | POA: Diagnosis not present

## 2022-03-26 DIAGNOSIS — R351 Nocturia: Secondary | ICD-10-CM | POA: Diagnosis not present

## 2022-03-26 DIAGNOSIS — R3915 Urgency of urination: Secondary | ICD-10-CM | POA: Diagnosis not present

## 2022-04-05 DIAGNOSIS — E1169 Type 2 diabetes mellitus with other specified complication: Secondary | ICD-10-CM | POA: Diagnosis not present

## 2022-04-05 DIAGNOSIS — I10 Essential (primary) hypertension: Secondary | ICD-10-CM | POA: Diagnosis not present

## 2022-04-05 DIAGNOSIS — E782 Mixed hyperlipidemia: Secondary | ICD-10-CM | POA: Diagnosis not present

## 2022-05-18 DIAGNOSIS — J209 Acute bronchitis, unspecified: Secondary | ICD-10-CM | POA: Diagnosis not present

## 2022-05-18 DIAGNOSIS — E782 Mixed hyperlipidemia: Secondary | ICD-10-CM | POA: Diagnosis not present

## 2022-05-18 DIAGNOSIS — I10 Essential (primary) hypertension: Secondary | ICD-10-CM | POA: Diagnosis not present

## 2022-05-18 DIAGNOSIS — E1169 Type 2 diabetes mellitus with other specified complication: Secondary | ICD-10-CM | POA: Diagnosis not present

## 2022-06-08 DIAGNOSIS — I10 Essential (primary) hypertension: Secondary | ICD-10-CM | POA: Diagnosis not present

## 2022-06-08 DIAGNOSIS — E1169 Type 2 diabetes mellitus with other specified complication: Secondary | ICD-10-CM | POA: Diagnosis not present

## 2022-06-08 DIAGNOSIS — E785 Hyperlipidemia, unspecified: Secondary | ICD-10-CM | POA: Diagnosis not present

## 2022-06-22 DIAGNOSIS — I8500 Esophageal varices without bleeding: Secondary | ICD-10-CM | POA: Diagnosis not present

## 2022-06-22 DIAGNOSIS — E1169 Type 2 diabetes mellitus with other specified complication: Secondary | ICD-10-CM | POA: Diagnosis not present

## 2022-06-22 DIAGNOSIS — E782 Mixed hyperlipidemia: Secondary | ICD-10-CM | POA: Diagnosis not present

## 2022-06-22 DIAGNOSIS — I10 Essential (primary) hypertension: Secondary | ICD-10-CM | POA: Diagnosis not present

## 2022-07-15 DIAGNOSIS — E1169 Type 2 diabetes mellitus with other specified complication: Secondary | ICD-10-CM | POA: Diagnosis not present

## 2022-07-19 ENCOUNTER — Other Ambulatory Visit (HOSPITAL_COMMUNITY): Payer: Self-pay

## 2022-07-19 MED ORDER — LIRAGLUTIDE 18 MG/3ML ~~LOC~~ SOPN
1.8000 mg | PEN_INJECTOR | Freq: Every day | SUBCUTANEOUS | 4 refills | Status: AC
  Filled 2022-07-19: qty 9, 30d supply, fill #0

## 2022-08-11 DIAGNOSIS — E1169 Type 2 diabetes mellitus with other specified complication: Secondary | ICD-10-CM | POA: Diagnosis not present

## 2022-08-11 DIAGNOSIS — I10 Essential (primary) hypertension: Secondary | ICD-10-CM | POA: Diagnosis not present

## 2022-08-12 DIAGNOSIS — I10 Essential (primary) hypertension: Secondary | ICD-10-CM | POA: Diagnosis not present

## 2022-08-12 DIAGNOSIS — N189 Chronic kidney disease, unspecified: Secondary | ICD-10-CM | POA: Diagnosis not present

## 2022-08-12 DIAGNOSIS — E1169 Type 2 diabetes mellitus with other specified complication: Secondary | ICD-10-CM | POA: Diagnosis not present

## 2022-08-12 DIAGNOSIS — E785 Hyperlipidemia, unspecified: Secondary | ICD-10-CM | POA: Diagnosis not present

## 2022-09-02 DIAGNOSIS — E349 Endocrine disorder, unspecified: Secondary | ICD-10-CM | POA: Diagnosis not present

## 2022-09-02 DIAGNOSIS — R948 Abnormal results of function studies of other organs and systems: Secondary | ICD-10-CM | POA: Diagnosis not present

## 2022-09-08 ENCOUNTER — Encounter (HOSPITAL_COMMUNITY): Payer: Self-pay

## 2022-09-08 ENCOUNTER — Inpatient Hospital Stay (HOSPITAL_COMMUNITY)
Admission: EM | Admit: 2022-09-08 | Discharge: 2022-09-12 | DRG: 872 | Disposition: A | Discharge status: Home / Self Care | Payer: Medicare PPO | Attending: Internal Medicine | Admitting: Internal Medicine

## 2022-09-08 ENCOUNTER — Emergency Department (HOSPITAL_COMMUNITY): Payer: Medicare PPO

## 2022-09-08 ENCOUNTER — Other Ambulatory Visit: Payer: Self-pay

## 2022-09-08 DIAGNOSIS — R6520 Severe sepsis without septic shock: Secondary | ICD-10-CM | POA: Diagnosis present

## 2022-09-08 DIAGNOSIS — E1122 Type 2 diabetes mellitus with diabetic chronic kidney disease: Secondary | ICD-10-CM | POA: Diagnosis present

## 2022-09-08 DIAGNOSIS — A419 Sepsis, unspecified organism: Secondary | ICD-10-CM | POA: Diagnosis not present

## 2022-09-08 DIAGNOSIS — K8020 Calculus of gallbladder without cholecystitis without obstruction: Secondary | ICD-10-CM | POA: Diagnosis not present

## 2022-09-08 DIAGNOSIS — Z7982 Long term (current) use of aspirin: Secondary | ICD-10-CM | POA: Diagnosis not present

## 2022-09-08 DIAGNOSIS — Z7984 Long term (current) use of oral hypoglycemic drugs: Secondary | ICD-10-CM

## 2022-09-08 DIAGNOSIS — Z7985 Long-term (current) use of injectable non-insulin antidiabetic drugs: Secondary | ICD-10-CM | POA: Diagnosis not present

## 2022-09-08 DIAGNOSIS — J45909 Unspecified asthma, uncomplicated: Secondary | ICD-10-CM | POA: Diagnosis present

## 2022-09-08 DIAGNOSIS — N390 Urinary tract infection, site not specified: Secondary | ICD-10-CM

## 2022-09-08 DIAGNOSIS — I10 Essential (primary) hypertension: Secondary | ICD-10-CM

## 2022-09-08 DIAGNOSIS — Z8546 Personal history of malignant neoplasm of prostate: Secondary | ICD-10-CM | POA: Diagnosis not present

## 2022-09-08 DIAGNOSIS — N289 Disorder of kidney and ureter, unspecified: Secondary | ICD-10-CM

## 2022-09-08 DIAGNOSIS — J9811 Atelectasis: Secondary | ICD-10-CM | POA: Diagnosis not present

## 2022-09-08 DIAGNOSIS — E119 Type 2 diabetes mellitus without complications: Secondary | ICD-10-CM

## 2022-09-08 DIAGNOSIS — Z794 Long term (current) use of insulin: Secondary | ICD-10-CM | POA: Diagnosis not present

## 2022-09-08 DIAGNOSIS — D631 Anemia in chronic kidney disease: Secondary | ICD-10-CM | POA: Diagnosis present

## 2022-09-08 DIAGNOSIS — N179 Acute kidney failure, unspecified: Secondary | ICD-10-CM | POA: Diagnosis present

## 2022-09-08 DIAGNOSIS — R000 Tachycardia, unspecified: Secondary | ICD-10-CM | POA: Diagnosis not present

## 2022-09-08 DIAGNOSIS — Z1152 Encounter for screening for COVID-19: Secondary | ICD-10-CM

## 2022-09-08 DIAGNOSIS — M549 Dorsalgia, unspecified: Secondary | ICD-10-CM | POA: Diagnosis not present

## 2022-09-08 DIAGNOSIS — Z79899 Other long term (current) drug therapy: Secondary | ICD-10-CM | POA: Diagnosis not present

## 2022-09-08 DIAGNOSIS — E872 Acidosis, unspecified: Secondary | ICD-10-CM | POA: Diagnosis present

## 2022-09-08 DIAGNOSIS — K5750 Diverticulosis of both small and large intestine without perforation or abscess without bleeding: Secondary | ICD-10-CM | POA: Diagnosis not present

## 2022-09-08 DIAGNOSIS — Z8249 Family history of ischemic heart disease and other diseases of the circulatory system: Secondary | ICD-10-CM

## 2022-09-08 DIAGNOSIS — B961 Klebsiella pneumoniae [K. pneumoniae] as the cause of diseases classified elsewhere: Secondary | ICD-10-CM | POA: Diagnosis present

## 2022-09-08 DIAGNOSIS — N136 Pyonephrosis: Secondary | ICD-10-CM | POA: Diagnosis present

## 2022-09-08 DIAGNOSIS — N1832 Chronic kidney disease, stage 3b: Secondary | ICD-10-CM | POA: Diagnosis present

## 2022-09-08 DIAGNOSIS — N134 Hydroureter: Secondary | ICD-10-CM | POA: Diagnosis not present

## 2022-09-08 DIAGNOSIS — I129 Hypertensive chronic kidney disease with stage 1 through stage 4 chronic kidney disease, or unspecified chronic kidney disease: Secondary | ICD-10-CM | POA: Diagnosis present

## 2022-09-08 DIAGNOSIS — R350 Frequency of micturition: Secondary | ICD-10-CM | POA: Diagnosis not present

## 2022-09-08 DIAGNOSIS — R509 Fever, unspecified: Secondary | ICD-10-CM | POA: Diagnosis not present

## 2022-09-08 LAB — URINALYSIS, W/ REFLEX TO CULTURE (INFECTION SUSPECTED)
Bilirubin Urine: NEGATIVE
Glucose, UA: NEGATIVE mg/dL
Ketones, ur: NEGATIVE mg/dL
Nitrite: NEGATIVE
Protein, ur: 30 mg/dL — AB
RBC / HPF: >50 RBC/hpf (ref 0–5)
Specific Gravity, Urine: 1.012 (ref 1.005–1.030)
WBC, UA: >50 WBC/hpf (ref 0–5)
pH: 5 (ref 5.0–8.0)

## 2022-09-08 LAB — COMPREHENSIVE METABOLIC PANEL
ALT: 20 U/L (ref 0–44)
AST: 15 U/L (ref 15–41)
Albumin: 4.1 g/dL (ref 3.5–5.0)
Alkaline Phosphatase: 48 U/L (ref 38–126)
Anion gap: 11 (ref 5–15)
BUN: 42 mg/dL — ABNORMAL HIGH (ref 8–23)
CO2: 19 mmol/L — ABNORMAL LOW (ref 22–32)
Calcium: 9.7 mg/dL (ref 8.9–10.3)
Chloride: 108 mmol/L (ref 98–111)
Creatinine, Ser: 2.24 mg/dL — ABNORMAL HIGH (ref 0.61–1.24)
GFR, Estimated: 30 mL/min — ABNORMAL LOW (ref >60)
Glucose, Bld: 182 mg/dL — ABNORMAL HIGH (ref 70–99)
Potassium: 4 mmol/L (ref 3.5–5.1)
Sodium: 138 mmol/L (ref 135–145)
Total Bilirubin: 0.6 mg/dL (ref 0.3–1.2)
Total Protein: 8.3 g/dL — ABNORMAL HIGH (ref 6.5–8.1)

## 2022-09-08 LAB — CBC WITH DIFFERENTIAL/PLATELET
Abs Immature Granulocytes: 0.03 10*3/uL (ref 0.00–0.07)
Basophils Absolute: 0 10*3/uL (ref 0.0–0.1)
Basophils Relative: 0 %
Eosinophils Absolute: 0 10*3/uL (ref 0.0–0.5)
Eosinophils Relative: 0 %
HCT: 36.6 % — ABNORMAL LOW (ref 39.0–52.0)
Hemoglobin: 12.2 g/dL — ABNORMAL LOW (ref 13.0–17.0)
Immature Granulocytes: 0 %
Lymphocytes Relative: 11 %
Lymphs Abs: 0.9 10*3/uL (ref 0.7–4.0)
MCH: 27.5 pg (ref 26.0–34.0)
MCHC: 33.3 g/dL (ref 30.0–36.0)
MCV: 82.4 fL (ref 80.0–100.0)
Monocytes Absolute: 0.7 10*3/uL (ref 0.1–1.0)
Monocytes Relative: 8 %
Neutro Abs: 6.6 10*3/uL (ref 1.7–7.7)
Neutrophils Relative %: 81 %
Platelets: 237 10*3/uL (ref 150–400)
RBC: 4.44 MIL/uL (ref 4.22–5.81)
RDW: 12.8 % (ref 11.5–15.5)
WBC: 8.2 10*3/uL (ref 4.0–10.5)
nRBC: 0 % (ref 0.0–0.2)

## 2022-09-08 LAB — RESP PANEL BY RT-PCR (RSV, FLU A&B, COVID)  RVPGX2
Influenza A by PCR: NEGATIVE
Influenza B by PCR: NEGATIVE
Resp Syncytial Virus by PCR: NEGATIVE
SARS Coronavirus 2 by RT PCR: NEGATIVE

## 2022-09-08 LAB — PROTIME-INR
INR: 1.2 (ref 0.8–1.2)
Prothrombin Time: 14.9 seconds (ref 11.4–15.2)

## 2022-09-08 LAB — APTT: aPTT: 33 seconds (ref 24–36)

## 2022-09-08 LAB — I-STAT CG4 LACTIC ACID, ED: Lactic Acid, Venous: 0.8 mmol/L (ref 0.5–1.9)

## 2022-09-08 MED ORDER — ACETAMINOPHEN 650 MG RE SUPP: 650.0000 mg | Freq: Four times a day (QID) | RECTAL | Status: DC | PRN

## 2022-09-08 MED ORDER — ONDANSETRON HCL 4 MG/2ML IJ SOLN: 4.0000 mg | Freq: Four times a day (QID) | INTRAMUSCULAR | Status: DC | PRN

## 2022-09-08 MED ORDER — INSULIN GLARGINE-YFGN 100 UNIT/ML ~~LOC~~ SOLN
8.0000 [IU] | Freq: Every day | SUBCUTANEOUS | Status: DC
  Administered 2022-09-09 – 2022-09-12 (×4): 8 [IU] via SUBCUTANEOUS
  Filled 2022-09-08 (×4): qty 0.08

## 2022-09-08 MED ORDER — INSULIN ASPART 100 UNIT/ML IJ SOLN
0.0000 [IU] | Freq: Every day | INTRAMUSCULAR | Status: DC
  Filled 2022-09-08: qty 0.05

## 2022-09-08 MED ORDER — LACTATED RINGERS IV SOLN
150.0000 mL/h | INTRAVENOUS | Status: AC
  Administered 2022-09-09 (×3): 150 mL/h via INTRAVENOUS

## 2022-09-08 MED ORDER — ACETAMINOPHEN 325 MG PO TABS
650.0000 mg | ORAL_TABLET | Freq: Four times a day (QID) | ORAL | Status: DC | PRN
  Administered 2022-09-09 (×3): 650 mg via ORAL
  Filled 2022-09-08 (×3): qty 2

## 2022-09-08 MED ORDER — TAMSULOSIN HCL 0.4 MG PO CAPS
0.4000 mg | ORAL_CAPSULE | Freq: Every day | ORAL | Status: DC
  Administered 2022-09-09 – 2022-09-11 (×4): 0.4 mg via ORAL
  Filled 2022-09-08 (×4): qty 1

## 2022-09-08 MED ORDER — INSULIN ASPART 100 UNIT/ML IJ SOLN
0.0000 [IU] | Freq: Three times a day (TID) | INTRAMUSCULAR | Status: DC
  Administered 2022-09-09 (×2): 2 [IU] via SUBCUTANEOUS
  Administered 2022-09-10: 3 [IU] via SUBCUTANEOUS
  Administered 2022-09-11 – 2022-09-12 (×3): 2 [IU] via SUBCUTANEOUS
  Filled 2022-09-08: qty 0.15

## 2022-09-08 MED ORDER — MONTELUKAST SODIUM 10 MG PO TABS
10.0000 mg | ORAL_TABLET | Freq: Every day | ORAL | Status: DC
  Administered 2022-09-09 – 2022-09-11 (×4): 10 mg via ORAL
  Filled 2022-09-08 (×4): qty 1

## 2022-09-08 MED ORDER — ONDANSETRON HCL 4 MG PO TABS: 4.0000 mg | ORAL_TABLET | Freq: Four times a day (QID) | ORAL | Status: DC | PRN

## 2022-09-08 MED ORDER — ENOXAPARIN SODIUM 40 MG/0.4ML IJ SOSY
40.0000 mg | PREFILLED_SYRINGE | INTRAMUSCULAR | Status: DC
  Administered 2022-09-09 – 2022-09-12 (×4): 40 mg via SUBCUTANEOUS
  Filled 2022-09-08 (×4): qty 0.4

## 2022-09-08 MED ORDER — LACTATED RINGERS IV SOLN: INTRAVENOUS | Status: AC

## 2022-09-08 MED ORDER — TESTOSTERONE 20.25 MG/ACT (1.62%) TD GEL: 2.0000 | Freq: Every day | TRANSDERMAL | Status: DC

## 2022-09-08 MED ORDER — LACTATED RINGERS IV BOLUS (SEPSIS)
1000.0000 mL | Freq: Once | INTRAVENOUS | Status: AC
  Administered 2022-09-08: 1000 mL via INTRAVENOUS

## 2022-09-08 MED ORDER — SODIUM CHLORIDE 0.9 % IV SOLN
2.0000 g | INTRAVENOUS | Status: DC
  Administered 2022-09-08 – 2022-09-09 (×2): 2 g via INTRAVENOUS
  Filled 2022-09-08 (×2): qty 20

## 2022-09-08 MED ORDER — FUSION PLUS PO CAPS: 1.0000 | ORAL_CAPSULE | Freq: Every day | ORAL | Status: DC

## 2022-09-08 MED ORDER — LIRAGLUTIDE 18 MG/3ML ~~LOC~~ SOPN: 1.8000 mg | PEN_INJECTOR | Freq: Every day | SUBCUTANEOUS | Status: DC

## 2022-09-08 MED ORDER — ASPIRIN 81 MG PO TBEC
81.0000 mg | DELAYED_RELEASE_TABLET | Freq: Every day | ORAL | Status: DC
  Administered 2022-09-09 – 2022-09-11 (×4): 81 mg via ORAL
  Filled 2022-09-08 (×4): qty 1

## 2022-09-08 NOTE — Sepsis Progress Note (Signed)
Elink monitoring for the code sepsis protocol.  

## 2022-09-08 NOTE — Assessment & Plan Note (Addendum)
Hold metformin and Farxiga Mod scale SSI AC/HS for the moment. Hold Tresiba 12u daily, use lantus 8u daily instead Cont Victoza

## 2022-09-08 NOTE — ED Notes (Signed)
..ED TO INPATIENT HANDOFF REPORT  ED Nurse Name and Phone #: [phone]  S Name/Age/Gender Alexander Foster 72 y.o. male Room/Bed: WA10/WA10  Code Status   Code Status: Full Code  Home/SNF/Other Home Patient oriented to: self, place, time, and situation Is this baseline? Yes   Triage Complete: Triage complete  Chief Complaint Sepsis secondary to UTI (HCC) [A41.9, N39.0]  Triage Note Pt reports with urinary frequency, back pain, and fever since Monday. Pt states that he has been having episodes of incontinence as well.    Allergies No Known Allergies  Level of Care/Admitting Diagnosis ED Disposition     ED Disposition  Admit   Condition  --   Comment  Hospital Area: [facility] [[id number]]  Level of Care: Progressive [102]  Admit to Progressive based on following criteria: MULTISYSTEM THREATS such as stable sepsis, metabolic/electrolyte imbalance with or without encephalopathy that is responding to early treatment.  May place patient in observation at [facility] or [facility] if equivalent level of care is available:: No  Covid Evaluation: Asymptomatic - no recent exposure (last 10 days) testing not required  Diagnosis: Sepsis secondary to UTI [facility]) [[id number]]  Admitting Physician: Hillary Bow [phone]  Attending Physician: Hillary Bow [phone]          B Medical/Surgery History Past Medical History:  Diagnosis Date   Anemia    Asthma    As a child   Cancer (HCC)    seed implant for prostate cancer   Diabetes mellitus without complication (HCC)    Hypertension    Past Surgical History:  Procedure Laterality Date   CYSTOSCOPY WITH STENT PLACEMENT Bilateral 01/02/2020   Procedure: CYSTOSCOPY WITH FIREFLY INJECTION;  Surgeon: Rene Paci, MD;  Location: [facility];  Service: Urology;  Laterality: Bilateral;   PROSTATE BIOPSY     RADIOACTIVE SEED IMPLANT  2010   TONSILLECTOMY     WISDOM TOOTH EXTRACTION       A IV  Location/Drains/Wounds Patient Lines/Drains/Airways Status     Active Line/Drains/Airways     Name Placement date Placement time Site Days   Peripheral IV 09/08/22 20 G Left Antecubital 09/08/22  2055  Antecubital  less than 1   Incision (Closed) 01/02/20 Abdomen Other (Comment) 01/02/20  1124  -- 980   Incision - 5 Ports Abdomen Mid;Upper Right;Upper;Medial Right;Mid Right;Lower;Medial Right;Lower;Lateral 01/02/20  0945  -- 980            Intake/Output Last 24 hours  Intake/Output Summary (Last 24 hours) at 09/08/2022 2358 Last data filed at 09/08/2022 2227 Gross per 24 hour  Intake 1099 ml  Output --  Net 1099 ml    Labs/Imaging Results for orders placed or performed during the hospital encounter of 09/08/22 (from the past 48 hour(s))  Urinalysis, w/ Reflex to Culture (Infection Suspected) -Urine, Clean Catch     Status: Abnormal   Collection Time: 09/08/22  8:04 PM  Result Value Ref Range   Specimen Source URINE, CATHETERIZED    Color, Urine YELLOW YELLOW   APPearance CLOUDY (A) CLEAR   Specific Gravity, Urine 1.012 1.005 - 1.030   pH 5.0 5.0 - 8.0   Glucose, UA NEGATIVE NEGATIVE mg/dL   Hgb urine dipstick LARGE (A) NEGATIVE   Bilirubin Urine NEGATIVE NEGATIVE   Ketones, ur NEGATIVE NEGATIVE mg/dL   Protein, ur 30 (A) NEGATIVE mg/dL   Nitrite NEGATIVE NEGATIVE   Leukocytes,Ua LARGE (A) NEGATIVE   RBC / HPF >50 0 -  5 RBC/hpf   WBC, UA >50 0 - 5 WBC/hpf    Comment:        Reflex urine culture not performed if WBC <=10, OR if Squamous epithelial cells >5. If Squamous epithelial cells >5 suggest recollection.    Bacteria, UA MANY (A) NONE SEEN   Squamous Epithelial / HPF 0-5 0 - 5 /HPF   WBC Clumps PRESENT     Comment: Performed at [facility], [address] [place], [place] [address]  Comprehensive metabolic panel     Status: Abnormal   Collection Time: 09/08/22  8:15 PM  Result Value Ref Range   Sodium 138 135 - 145 mmol/L   Potassium  4.0 3.5 - 5.1 mmol/L   Chloride 108 98 - 111 mmol/L   CO2 19 (L) 22 - 32 mmol/L   Glucose, Bld 182 (H) 70 - 99 mg/dL    Comment: Glucose reference range applies only to samples taken after fasting for at least 8 hours.   BUN 42 (H) 8 - 23 mg/dL   Creatinine, Ser 6.29 (H) 0.61 - 1.24 mg/dL   Calcium 9.7 8.9 - 52.8 mg/dL   Total Protein 8.3 (H) 6.5 - 8.1 g/dL   Albumin 4.1 3.5 - 5.0 g/dL   AST 15 15 - 41 U/L   ALT 20 0 - 44 U/L   Alkaline Phosphatase 48 38 - 126 U/L   Total Bilirubin 0.6 0.3 - 1.2 mg/dL   GFR, Estimated 30 (L) >60 mL/min    Comment: (NOTE) Calculated using the CKD-EPI Creatinine Equation (2021)    Anion gap 11 5 - 15    Comment: Performed at [facility], [address] [place], [place] [address]  CBC with Differential     Status: Abnormal   Collection Time: 09/08/22  8:15 PM  Result Value Ref Range   WBC 8.2 4.0 - 10.5 K/uL   RBC 4.44 4.22 - 5.81 MIL/uL   Hemoglobin 12.2 (L) 13.0 - 17.0 g/dL   HCT 40.1 (L) 02.7 - 25.3 %   MCV 82.4 80.0 - 100.0 fL   MCH 27.5 26.0 - 34.0 pg   MCHC 33.3 30.0 - 36.0 g/dL   RDW 66.4 40.3 - 47.4 %   Platelets 237 150 - 400 K/uL   nRBC 0.0 0.0 - 0.2 %   Neutrophils Relative % 81 %   Neutro Abs 6.6 1.7 - 7.7 K/uL   Lymphocytes Relative 11 %   Lymphs Abs 0.9 0.7 - 4.0 K/uL   Monocytes Relative 8 %   Monocytes Absolute 0.7 0.1 - 1.0 K/uL   Eosinophils Relative 0 %   Eosinophils Absolute 0.0 0.0 - 0.5 K/uL   Basophils Relative 0 %   Basophils Absolute 0.0 0.0 - 0.1 K/uL   Immature Granulocytes 0 %   Abs Immature Granulocytes 0.03 0.00 - 0.07 K/uL    Comment: Performed at [facility], [address] [place], [place] [address]  Protime-INR     Status: None   Collection Time: 09/08/22  8:15 PM  Result Value Ref Range   Prothrombin Time 14.9 11.4 - 15.2 seconds   INR 1.2 0.8 - 1.2    Comment: (NOTE) INR goal varies based on device and disease states. Performed at [facility], [address] [place], [place] [address]   APTT     Status: None   Collection Time: 09/08/22  8:15 PM  Result Value Ref Range  aPTT 33 24 - 36 seconds    Comment: Performed at [facility], [address] [place], [place] [address]  Resp panel by RT-PCR (RSV, Flu A&B, Covid) Anterior Nasal Swab     Status: None   Collection Time: 09/08/22  8:51 PM   Specimen: Anterior Nasal Swab  Result Value Ref Range   SARS Coronavirus 2 by RT PCR NEGATIVE NEGATIVE    Comment: (NOTE) SARS-CoV-2 target nucleic acids are NOT DETECTED.  The SARS-CoV-2 RNA is generally detectable in upper respiratory specimens during the acute phase of infection. The lowest concentration of SARS-CoV-2 viral copies this assay can detect is 138 copies/mL. A negative result does not preclude SARS-Cov-2 infection and should not be used as the sole basis for treatment or other patient management decisions. A negative result may occur with  improper specimen collection/handling, submission of specimen other than nasopharyngeal swab, presence of viral mutation(s) within the areas targeted by this assay, and inadequate number of viral copies(<138 copies/mL). A negative result must be combined with clinical observations, patient history, and epidemiological information. The expected result is Negative.  Fact Sheet for Patients:  BloggerCourse.com  Fact Sheet for Healthcare Providers:  SeriousBroker.it  This test is no t yet approved or cleared by the [place] FDA and  has been authorized for detection and/or diagnosis of SARS-CoV-2 by FDA under an Emergency Use Authorization (EUA). This EUA will remain  in effect (meaning this test can be used) for the duration of the COVID-19 declaration under Section 564(b)(1) of the Act, 21 U.S.C.section 360bbb-3(b)(1), unless the authorization is terminated  or revoked sooner.       Influenza  A by PCR NEGATIVE NEGATIVE   Influenza B by PCR NEGATIVE NEGATIVE    Comment: (NOTE) The Xpert Xpress SARS-CoV-2/FLU/RSV plus assay is intended as an aid in the diagnosis of influenza from Nasopharyngeal swab specimens and should not be used as a sole basis for treatment. Nasal washings and aspirates are unacceptable for Xpert Xpress SARS-CoV-2/FLU/RSV testing.  Fact Sheet for Patients: BloggerCourse.com  Fact Sheet for Healthcare Providers: SeriousBroker.it  This test is not yet approved or cleared by the [place] FDA and has been authorized for detection and/or diagnosis of SARS-CoV-2 by FDA under an Emergency Use Authorization (EUA). This EUA will remain in effect (meaning this test can be used) for the duration of the COVID-19 declaration under Section 564(b)(1) of the Act, 21 U.S.C. section 360bbb-3(b)(1), unless the authorization is terminated or revoked.     Resp Syncytial Virus by PCR NEGATIVE NEGATIVE    Comment: (NOTE) Fact Sheet for Patients: BloggerCourse.com  Fact Sheet for Healthcare Providers: SeriousBroker.it  This test is not yet approved or cleared by the [place] FDA and has been authorized for detection and/or diagnosis of SARS-CoV-2 by FDA under an Emergency Use Authorization (EUA). This EUA will remain in effect (meaning this test can be used) for the duration of the COVID-19 declaration under Section 564(b)(1) of the Act, 21 U.S.C. section 360bbb-3(b)(1), unless the authorization is terminated or revoked.  Performed at [facility] [address] [place], [place] [address]   I-Stat Lactic Acid, ED     Status: None   Collection Time: 09/08/22  8:56 PM  Result Value Ref Range   Lactic Acid, Venous 0.8 0.5 - 1.9 mmol/L   CT Renal Stone Study  Result Date: 09/08/2022 CLINICAL DATA:  Urinary frequency, back pain, fever.  EXAM: CT ABDOMEN AND PELVIS WITHOUT CONTRAST TECHNIQUE: Multidetector  CT imaging of the abdomen and pelvis was performed following the standard protocol without IV contrast. RADIATION DOSE REDUCTION: This exam was performed according to the departmental dose-optimization program which includes automated exposure control, adjustment of the mA and/or kV according to patient size and/or use of iterative reconstruction technique. COMPARISON:  05/11/2019 FINDINGS: Lower chest: Mild aortic valve calcification. Hepatobiliary: Diffuse hepatic steatosis. 3.0 cm in long axis gallstone in the gallbladder. No biliary dilatation. Pancreas: Unremarkable Spleen: Unremarkable Adrenals/Urinary Tract: 1.1 by 1.0 cm fat density myelolipoma of the right adrenal gland. No further imaging workup of this lesion is indicated. Mild bilateral hydroureter without substantial hydronephrosis and without urinary tract calculi. Mild urinary bladder wall thickening, query chronic cystitis. Stomach/Bowel: Small periampullary duodenal diverticulum. Normal appendix. Scattered transverse colon diverticula along with diverticulosis of the proximal sigmoid colon. Mid to distal sigmoid colon anastomosis without complicating feature. Vascular/Lymphatic: Mild abdominal aortic atherosclerotic vascular calcification. No pathologic adenopathy observed. Reproductive: Brachytherapy seed implants distributed in the prostate gland. Other: Anterior pelvic lymph node measures 5 mm in short axis on image 68 series 2, formerly 10 mm. This is likely benign. Subcutaneous edema along the anterior abdominal wall, left greater than right, likely injection related. Musculoskeletal: Bridging spurring of the left sacroiliac joint. Probable bone island in the left iliac bone on image 60 series 2, no change from 2021. Lower lumbar spondylosis and degenerative disc disease with resulting bilateral foraminal impingement at L5-S1. IMPRESSION: 1. Mild bilateral hydroureter without  substantial hydronephrosis and without urinary tract calculi. Mild urinary bladder wall thickening, query chronic cystitis. Possibility of low-grade bladder outlet obstruction is raised. 2. Brachytherapy seed implants distributed in the prostate gland. 3. Diffuse hepatic steatosis. 4. Cholelithiasis. 5. Colonic diverticulosis. 6. Lower lumbar spondylosis and degenerative disc disease with resulting bilateral foraminal impingement at L5-S1. 7. Mild aortic valve calcification. 8. Aortic atherosclerosis. Aortic Atherosclerosis (ICD10-I70.0). Electronically Signed   By: Gaylyn Rong M.D.   On: 09/08/2022 21:49   DG Chest Port 1 View  Result Date: 09/08/2022 CLINICAL DATA:  Urinary frequency, back pain, fever EXAM: PORTABLE CHEST 1 VIEW COMPARISON:  09/03/2017 FINDINGS: Platelike scarring/atelectasis in the right lower lobe. Left lung is clear. No pleural effusion or pneumothorax. The heart is normal in size. IMPRESSION: No acute cardiopulmonary disease. Electronically Signed   By: Charline Bills M.D.   On: 09/08/2022 20:09    Pending Labs Unresulted Labs (From admission, onward)     Start     Ordered   09/09/22 0500  Basic metabolic panel  Tomorrow morning,   R        09/08/22 2312   09/09/22 0500  CBC  Tomorrow morning,   R        09/08/22 2312   09/09/22 0500  Protime-INR  Tomorrow morning,   R        09/08/22 2319   09/09/22 0500  Cortisol-am, Foster  Tomorrow morning,   R        09/08/22 2319   09/09/22 0500  Procalcitonin  Tomorrow morning,   R       References:    Procalcitonin Lower Respiratory Tract Infection AND Sepsis Procalcitonin Algorithm   09/08/22 2319   09/08/22 2321  Hemoglobin A1c  Once,   R       Comments: To assess prior glycemic control    09/08/22 2320   09/08/22 2004  Urine Culture  Once,   R        09/08/22 2004   09/08/22 1955  Culture, Foster (Routine x 2)  Foster CULTURE X 2,   R,   Status:  Canceled      09/08/22 1954            Vitals/Pain Today's  Vitals   09/08/22 2145 09/08/22 2155 09/08/22 2200 09/08/22 2215  BP:  (!) 150/84 (!) 168/84   Pulse: (!) 126 (!) 125 (!) 124 (!) 122  Resp:  18 18   Temp:      TempSrc:      SpO2: 99% 99% 100% 98%  Weight:      Height:      PainSc:        Isolation Precautions No active isolations  Medications Medications  lactated ringers infusion ( Intravenous New Bag/Given 09/08/22 2228)  cefTRIAXone (ROCEPHIN) 2 g in sodium chloride 0.9 % 100 mL IVPB (0 g Intravenous Stopped 09/08/22 2131)  lactated ringers infusion (has no administration in time range)  enoxaparin (LOVENOX) injection 40 mg (has no administration in time range)  acetaminophen (TYLENOL) tablet 650 mg (has no administration in time range)    Or  acetaminophen (TYLENOL) suppository 650 mg (has no administration in time range)  ondansetron (ZOFRAN) tablet 4 mg (has no administration in time range)    Or  ondansetron (ZOFRAN) injection 4 mg (has no administration in time range)  insulin aspart (novoLOG) injection 0-15 Units (has no administration in time range)  insulin aspart (novoLOG) injection 0-5 Units (has no administration in time range)  insulin glargine-yfgn (SEMGLEE) injection 8 Units (has no administration in time range)  aspirin EC tablet 81 mg (has no administration in time range)  Testosterone 20.25 MG/ACT (1.62%) GEL 2 Pump (has no administration in time range)  liraglutide (VICTOZA) SOPN 1.8 mg (has no administration in time range)  Fusion Plus CAPS 1 capsule (has no administration in time range)  montelukast (SINGULAIR) tablet 10 mg (has no administration in time range)  tamsulosin (FLOMAX) capsule 0.4 mg (has no administration in time range)  lactated ringers bolus 1,000 mL (0 mLs Intravenous Stopped 09/08/22 2227)    Mobility walks     Focused Assessments Genitourinary assessment   R Recommendations: See Admitting Provider Note  Report given to:   Additional Notes: pt is alert and oriented x4, is  ambulatory, has walked to the bathroom.

## 2022-09-08 NOTE — ED Provider Notes (Addendum)
[place] EMERGENCY DEPARTMENT AT [facility] Provider Note   CSN: [id number] Arrival date & time: 09/08/22  1945     History  Chief Complaint  Patient presents with   Urinary Frequency   Fever    Alexander Foster is a 72 y.o. male.  Patient is a 72 year old male with a history of hypertension, diabetes, anemia, prostate cancer status post radioactive seed implants who is presenting today with a 2-1/2-day history of back pain, fevers up to 102, urinary urgency and some dribbling.  He denies any lower abdominal pain.  No nausea or vomiting but decreased oral intake.  No cough or congestion but has had some mild shortness of breath.  No altered mental status.  He has had some intermittent constipation over the last few days but reports had a large bowel movements that is resolved.  No diarrhea.  The history is provided by the patient and the spouse.  Urinary Frequency  Fever      Home Medications Prior to Admission medications   Medication Sig Start Date End Date Taking? Authorizing Provider  ANDROGEL PUMP 20.25 MG/ACT (1.62%) GEL Apply 2 Pump topically daily. Applied to each side of chest daily in the morning. 07/01/16   [provider]  aspirin EC 81 MG tablet Take 81 mg by mouth at bedtime. Swallow whole.    [provider]  Iron-FA-B Cmp-C-Biot-Probiotic (FUSION PLUS) CAPS Take 1 capsule by mouth at bedtime.  01/28/14   [provider]  liraglutide (VICTOZA) 18 MG/3ML SOPN Inject 1.8 mg into the skin daily. 07/19/22   Renaye Rakers, MD  metFORMIN (GLUCOPHAGE) 1000 MG tablet Take 1,000 mg by mouth in the morning and at bedtime.     [provider]  montelukast (SINGULAIR) 10 MG tablet Take 10 mg by mouth at bedtime.  08/24/17   [provider]  Multiple Vitamin (MULTIVITAMIN WITH MINERALS) TABS tablet Take 1 tablet by mouth daily.    [provider]  niacin (NIASPAN) 500 MG CR tablet Take 500 mg by mouth at bedtime.     [provider]  tamsulosin (FLOMAX) 0.4 MG CAPS capsule Take 0.4 mg by mouth at bedtime.     [provider]  TRESIBA FLEXTOUCH 200 UNIT/ML FlexTouch Pen Inject 40 Units into the skin daily. 10/04/19   [provider]  TRIBENZOR 20-5-12.5 MG TABS Take 1 tablet by mouth daily. Hold for 1 week after discharge then restart 01/11/20   Romie Levee, MD  VICTOZA 18 MG/3ML SOPN Inject 1.8 mg into the skin daily.  08/20/14   [provider]      Allergies    Patient has no known allergies.    Review of Systems   Review of Systems  Constitutional:  Positive for fever.  Genitourinary:  Positive for frequency.    Physical Exam Updated Vital Signs BP (!) 150/84   Pulse (!) 125   Temp (!) 100.6 F (38.1 C) (Oral)   Resp 18   Ht 5\' 10"  (1.778 m)   Wt 92.5 kg   SpO2 99%   BMI 29.26 kg/m  Physical Exam Vitals and nursing note reviewed.  Constitutional:      General: He is not in acute distress.    Appearance: He is well-developed.  HENT:     Head: Normocephalic and atraumatic.     Mouth/Throat:     Mouth: Mucous membranes are dry.  Eyes:     Conjunctiva/sclera: Conjunctivae normal.     Pupils:  Pupils are equal, round, and reactive to light.  Cardiovascular:     Rate and Rhythm: Regular rhythm. Tachycardia present.     Heart sounds: No murmur heard. Pulmonary:     Effort: Pulmonary effort is normal. No respiratory distress.     Breath sounds: Normal breath sounds. No wheezing or rales.  Abdominal:     General: There is no distension.     Palpations: Abdomen is soft.     Tenderness: There is no abdominal tenderness. There is no right CVA tenderness, left CVA tenderness, guarding or rebound.  Musculoskeletal:        General: No tenderness. Normal range of motion.     Cervical back: Normal range of motion and neck supple.  Skin:    General: Skin is warm and dry.     Findings: No erythema or rash.  Neurological:     Mental Status: He is alert  and oriented to person, place, and time.  Psychiatric:        Behavior: Behavior normal.     ED Results / Procedures / Treatments   Labs (all labs ordered are listed, but only abnormal results are displayed) Labs Reviewed  COMPREHENSIVE METABOLIC PANEL - Abnormal; Notable for the following components:      Result Value   CO2 19 (*)    Glucose, Bld 182 (*)    BUN 42 (*)    Creatinine, Ser 2.24 (*)    Total Protein 8.3 (*)    GFR, Estimated 30 (*)    All other components within normal limits  CBC WITH DIFFERENTIAL/PLATELET - Abnormal; Notable for the following components:   Hemoglobin 12.2 (*)    HCT 36.6 (*)    All other components within normal limits  URINALYSIS, W/ REFLEX TO CULTURE (INFECTION SUSPECTED) - Abnormal; Notable for the following components:   APPearance CLOUDY (*)    Hgb urine dipstick LARGE (*)    Protein, ur 30 (*)    Leukocytes,Ua LARGE (*)    Bacteria, UA MANY (*)    All other components within normal limits  RESP PANEL BY RT-PCR (RSV, FLU A&B, COVID)  RVPGX2  CULTURE, BLOOD (ROUTINE X 2)  URINE CULTURE  PROTIME-INR  APTT  I-STAT CG4 LACTIC ACID, ED  I-STAT CG4 LACTIC ACID, ED  I-STAT CG4 LACTIC ACID, ED    EKG EKG Interpretation Date/Time:  Wednesday September 08 2022 20:59:28 EDT Ventricular Rate:  129 PR Interval:  134 QRS Duration:  142 QT Interval:  290 QTC Calculation: 425 R Axis:   72  Text Interpretation: Sinus tachycardia Right bundle branch block Lateral leads are also involved Confirmed by Gwyneth Sprout ([id number]) on 09/08/2022 9:30:21 PM  Radiology CT Renal Stone Study  Result Date: 09/08/2022 CLINICAL DATA:  Urinary frequency, back pain, fever. EXAM: CT ABDOMEN AND PELVIS WITHOUT CONTRAST TECHNIQUE: Multidetector CT imaging of the abdomen and pelvis was performed following the standard protocol without IV contrast. RADIATION DOSE REDUCTION: This exam was performed according to the departmental dose-optimization program which includes  automated exposure control, adjustment of the mA and/or kV according to patient size and/or use of iterative reconstruction technique. COMPARISON:  05/11/2019 FINDINGS: Lower chest: Mild aortic valve calcification. Hepatobiliary: Diffuse hepatic steatosis. 3.0 cm in long axis gallstone in the gallbladder. No biliary dilatation. Pancreas: Unremarkable Spleen: Unremarkable Adrenals/Urinary Tract: 1.1 by 1.0 cm fat density myelolipoma of the right adrenal gland. No further imaging workup of this lesion is indicated. Mild bilateral hydroureter without substantial hydronephrosis and without urinary  tract calculi. Mild urinary bladder wall thickening, query chronic cystitis. Stomach/Bowel: Small periampullary duodenal diverticulum. Normal appendix. Scattered transverse colon diverticula along with diverticulosis of the proximal sigmoid colon. Mid to distal sigmoid colon anastomosis without complicating feature. Vascular/Lymphatic: Mild abdominal aortic atherosclerotic vascular calcification. No pathologic adenopathy observed. Reproductive: Brachytherapy seed implants distributed in the prostate gland. Other: Anterior pelvic lymph node measures 5 mm in short axis on image 68 series 2, formerly 10 mm. This is likely benign. Subcutaneous edema along the anterior abdominal wall, left greater than right, likely injection related. Musculoskeletal: Bridging spurring of the left sacroiliac joint. Probable bone island in the left iliac bone on image 60 series 2, no change from 2021. Lower lumbar spondylosis and degenerative disc disease with resulting bilateral foraminal impingement at L5-S1. IMPRESSION: 1. Mild bilateral hydroureter without substantial hydronephrosis and without urinary tract calculi. Mild urinary bladder wall thickening, query chronic cystitis. Possibility of low-grade bladder outlet obstruction is raised. 2. Brachytherapy seed implants distributed in the prostate gland. 3. Diffuse hepatic steatosis. 4.  Cholelithiasis. 5. Colonic diverticulosis. 6. Lower lumbar spondylosis and degenerative disc disease with resulting bilateral foraminal impingement at L5-S1. 7. Mild aortic valve calcification. 8. Aortic atherosclerosis. Aortic Atherosclerosis (ICD10-I70.0). Electronically Signed   By: Gaylyn Rong M.D.   On: 09/08/2022 21:49   DG Chest Port 1 View  Result Date: 09/08/2022 CLINICAL DATA:  Urinary frequency, back pain, fever EXAM: PORTABLE CHEST 1 VIEW COMPARISON:  09/03/2017 FINDINGS: Platelike scarring/atelectasis in the right lower lobe. Left lung is clear. No pleural effusion or pneumothorax. The heart is normal in size. IMPRESSION: No acute cardiopulmonary disease. Electronically Signed   By: Charline Bills M.D.   On: 09/08/2022 20:09    Procedures Procedures    Medications Ordered in ED Medications  lactated ringers infusion (has no administration in time range)  lactated ringers bolus 1,000 mL (1,000 mLs Intravenous New Bag/Given 09/08/22 2134)  cefTRIAXone (ROCEPHIN) 2 g in sodium chloride 0.9 % 100 mL IVPB (0 g Intravenous Stopped 09/08/22 2131)    ED Course/ Medical Decision Making/ A&P                                 Medical Decision Making Amount and/or Complexity of Data Reviewed Independent Historian: spouse Labs: ordered. Decision-making details documented in ED Course. Radiology: ordered and independent interpretation performed. Decision-making details documented in ED Course. ECG/medicine tests: ordered and independent interpretation performed. Decision-making details documented in ED Course.  Risk Prescription drug management. Decision regarding hospitalization.   Pt with multiple medical problems and comorbidities and presenting today with a complaint that caries a high risk for morbidity and mortality.  Here today with symptoms concerning for sepsis.  He has had fever and felt generally unwell for the last 2 days.  Patient today is tachycardic, febrile and  having urinary symptoms.  I independently interpreted patient's labs and EKG which shows sinus tachycardia with a right bundle branch block which is unchanged.  Lactic acid within normal limits, CMP with new AKI with creatinine of 2.24 from patient's baseline of 1.5, CBC with normal white count and stable hemoglobin, UA with large leukocytes, greater than 50 red cells and white cells with many bacteria.  I have independently visualized and interpreted pt's images today.  CXR wnl.  Patient given Rocephin per sepsis protocol and IV fluids.  Will continue to monitor patient's tachycardia.  Will do a CT to ensure there is no  evidence of infected stone or other reason for his AKI other than sepsis.  10:25 PM CT without acute findings other than mild hydroureter concerning for possible mild bladder outlet.  Pt will be treated for UTI.  Will admit due to tachycardia and infection.  HR improved from 140 to 120.  Findings discussed with pt and family and they are comfortable with this plan.          Final Clinical Impression(s) / ED Diagnoses Final diagnoses:  Sepsis with acute renal failure without septic shock, due to unspecified organism, unspecified acute renal failure type (HCC)  Lower urinary tract infectious disease    Rx / DC Orders ED Discharge Orders     None         Gwyneth Sprout, MD 09/08/22 2225    Gwyneth Sprout, MD 09/08/22 2225

## 2022-09-08 NOTE — Assessment & Plan Note (Addendum)
Sepsis secondary to UTI. Lactate 0.8 S. Tachycardia improved from 140 to 122 after IVF. Tylenol PRN fever IVF: IVF: 1L bolus in ED and continuous at 150 cc/hr Tele monitor Rocephin UCx pending Tylenol PRN fever

## 2022-09-08 NOTE — Assessment & Plan Note (Addendum)
Hold tribenzor and kerendia in setting of suspected AKI superimposed on CKD.

## 2022-09-08 NOTE — ED Notes (Signed)
Pt returned from CT °

## 2022-09-08 NOTE — Assessment & Plan Note (Addendum)
Creat 2.2 today up from 1.5 x3 years ago. AKI vs progression of CKD? Im thinking AKI superimposed on CKD because it looks like he's still on metformin chronically, also looks like BID dosing of tribenzor and someone has him on [place]: IVF: 1L bolus in ED at 150 cc/hr Strict intake and output Repeat BMP in AM Bladder scan to see if he needs foley placement Only 78 cc in bladder post void, so no indication for foley at this point. Hold metformin, Farxiga, and [place]

## 2022-09-08 NOTE — Assessment & Plan Note (Signed)
S/p Seed implant Surprisingly someone is also doing testosterone replacement therapy on this patient, despite h/o prostate CA? Continue testosterone replacement therapy for the moment. Sent message to urology that he's going to be missing office visit tomorrow (due to admission) Will ask about the testosterone thing.

## 2022-09-08 NOTE — ED Notes (Signed)
Patient transported to CT 

## 2022-09-08 NOTE — H&P (Signed)
History and Physical    Patient: Alexander Foster IDP:[id number] DOB: 09/03/1950 DOA: 09/08/2022 DOS: the patient was seen and examined on 09/08/2022 PCP: Renaye Rakers, MD  Patient coming from: Home  Chief Complaint:  Chief Complaint  Patient presents with   Urinary Frequency   Fever   HPI: Alexander Foster is a 72 y.o. male with medical history significant of HTN, DM2, prostate CA s/p seed implant.  Pt with 2.5 day h/o back pain, fevers to 102, urinary urgency with some dribbling.  No lower abd / pelvic pain.  No N/V, but does have decreased PO intake.  Was intermittently constipated over past few days but reports large BM today and now this is resolved.  No diarrhea.  In ED: Tachycardic and Febrile.  UA c/w UTI.  CT showing mild B hydro with no stones.  Radiologist questions if low grade urinary outlet obstruction may be present / cause.   Review of Systems: As mentioned in the history of present illness. All other systems reviewed and are negative. Past Medical History:  Diagnosis Date   Anemia    Asthma    As a child   Cancer (HCC)    seed implant for prostate cancer   Diabetes mellitus without complication (HCC)    Hypertension    Past Surgical History:  Procedure Laterality Date   CYSTOSCOPY WITH STENT PLACEMENT Bilateral 01/02/2020   Procedure: CYSTOSCOPY WITH FIREFLY INJECTION;  Surgeon: Rene Paci, MD;  Location: [facility];  Service: Urology;  Laterality: Bilateral;   PROSTATE BIOPSY     RADIOACTIVE SEED IMPLANT  2010   TONSILLECTOMY     WISDOM TOOTH EXTRACTION     Social History:  reports that he has never smoked. He has never used smokeless tobacco. He reports current alcohol use. He reports that he does not use drugs.  No Known Allergies  Family History  Problem Relation Age of Onset   Heart attack Mother    Healthy Father        reports living to 36, without any major health issues    Prior to Admission medications   Medication Sig Start Date  End Date Taking? Authorizing Provider  ANDROGEL PUMP 20.25 MG/ACT (1.62%) GEL Apply 2 Pump topically daily. Applied to each side of chest daily in the morning. 07/01/16   [provider]  aspirin EC 81 MG tablet Take 81 mg by mouth at bedtime. Swallow whole.    [provider]  Iron-FA-B Cmp-C-Biot-Probiotic (FUSION PLUS) CAPS Take 1 capsule by mouth at bedtime.  01/28/14   [provider]  liraglutide (VICTOZA) 18 MG/3ML SOPN Inject 1.8 mg into the skin daily. 07/19/22   Renaye Rakers, MD  metFORMIN (GLUCOPHAGE) 1000 MG tablet Take 1,000 mg by mouth in the morning and at bedtime.     [provider]  montelukast (SINGULAIR) 10 MG tablet Take 10 mg by mouth at bedtime.  08/24/17   [provider]  Multiple Vitamin (MULTIVITAMIN WITH MINERALS) TABS tablet Take 1 tablet by mouth daily.    [provider]  niacin (NIASPAN) 500 MG CR tablet Take 500 mg by mouth at bedtime.    [provider]  tamsulosin (FLOMAX) 0.4 MG CAPS capsule Take 0.4 mg by mouth at bedtime.     [provider]  TRESIBA FLEXTOUCH 200 UNIT/ML FlexTouch Pen Inject 40 Units into the skin daily. 10/04/19   [provider]  TRIBENZOR 20-5-12.5 MG TABS Take 1 tablet by mouth daily. Hold  for 1 week after discharge then restart 01/11/20   Romie Levee, MD  VICTOZA 18 MG/3ML SOPN Inject 1.8 mg into the skin daily.  08/20/14   [provider]    Physical Exam: Vitals:   09/08/22 2145 09/08/22 2155 09/08/22 2200 09/08/22 2215  BP:  (!) 150/84 (!) 168/84   Pulse: (!) 126 (!) 125 (!) 124 (!) 122  Resp:  18 18   Temp:      TempSrc:      SpO2: 99% 99% 100% 98%  Weight:      Height:       Constitutional: NAD, calm, comfortable Respiratory: clear to auscultation bilaterally, no wheezing, no crackles. Normal respiratory effort. No accessory muscle use.  Cardiovascular: Tachycardic Abdomen: no tenderness, no masses palpated. No hepatosplenomegaly. Bowel  sounds positive.  Skin: No foot ulcer, no rash Neurologic: CN 2-12 grossly intact. Sensation intact, DTR normal. Strength 5/5 in all 4.  Psychiatric: Normal judgment and insight. Alert and oriented x 3. Normal mood.   Data Reviewed:    Labs on Admission: I have personally reviewed following labs and imaging studies  CBC: Recent Labs  Lab 09/08/22 2015  WBC 8.2  NEUTROABS 6.6  HGB 12.2*  HCT 36.6*  MCV 82.4  PLT 237   Basic Metabolic Panel: Recent Labs  Lab 09/08/22 2015  NA 138  K 4.0  CL 108  CO2 19*  GLUCOSE 182*  BUN 42*  CREATININE 2.24*  CALCIUM 9.7   GFR: Estimated Creatinine Clearance: 33.6 mL/min (A) (by C-G formula based on SCr of 2.24 mg/dL (H)). Liver Function Tests: Recent Labs  Lab 09/08/22 2015  AST 15  ALT 20  ALKPHOS 48  BILITOT 0.6  PROT 8.3*  ALBUMIN 4.1   No results for input(s): "LIPASE", "AMYLASE" in the last 168 hours. No results for input(s): "AMMONIA" in the last 168 hours. Coagulation Profile: Recent Labs  Lab 09/08/22 2015  INR 1.2   Cardiac Enzymes: No results for input(s): "CKTOTAL", "CKMB", "CKMBINDEX", "TROPONINI" in the last 168 hours. BNP (last 3 results) No results for input(s): "PROBNP" in the last 8760 hours. HbA1C: No results for input(s): "HGBA1C" in the last 72 hours. CBG: No results for input(s): "GLUCAP" in the last 168 hours. Lipid Profile: No results for input(s): "CHOL", "HDL", "LDLCALC", "TRIG", "CHOLHDL", "LDLDIRECT" in the last 72 hours. Thyroid Function Tests: No results for input(s): "TSH", "T4TOTAL", "FREET4", "T3FREE", "THYROIDAB" in the last 72 hours. Anemia Panel: No results for input(s): "VITAMINB12", "FOLATE", "FERRITIN", "TIBC", "IRON", "RETICCTPCT" in the last 72 hours. Urine analysis:    Component Value Date/Time   COLORURINE YELLOW 09/08/2022 2004   APPEARANCEUR CLOUDY (A) 09/08/2022 2004   LABSPEC 1.012 09/08/2022 2004   PHURINE 5.0 09/08/2022 2004   GLUCOSEU NEGATIVE 09/08/2022 2004    HGBUR LARGE (A) 09/08/2022 2004   BILIRUBINUR NEGATIVE 09/08/2022 2004   KETONESUR NEGATIVE 09/08/2022 2004   PROTEINUR 30 (A) 09/08/2022 2004   UROBILINOGEN 0.2 01/13/2009 1959   NITRITE NEGATIVE 09/08/2022 2004   LEUKOCYTESUR LARGE (A) 09/08/2022 2004    Radiological Exams on Admission: CT Renal Stone Study  Result Date: 09/08/2022 CLINICAL DATA:  Urinary frequency, back pain, fever. EXAM: CT ABDOMEN AND PELVIS WITHOUT CONTRAST TECHNIQUE: Multidetector CT imaging of the abdomen and pelvis was performed following the standard protocol without IV contrast. RADIATION DOSE REDUCTION: This exam was performed according to the departmental dose-optimization program which includes automated exposure control, adjustment of the mA and/or kV according to patient size and/or use  of iterative reconstruction technique. COMPARISON:  05/11/2019 FINDINGS: Lower chest: Mild aortic valve calcification. Hepatobiliary: Diffuse hepatic steatosis. 3.0 cm in long axis gallstone in the gallbladder. No biliary dilatation. Pancreas: Unremarkable Spleen: Unremarkable Adrenals/Urinary Tract: 1.1 by 1.0 cm fat density myelolipoma of the right adrenal gland. No further imaging workup of this lesion is indicated. Mild bilateral hydroureter without substantial hydronephrosis and without urinary tract calculi. Mild urinary bladder wall thickening, query chronic cystitis. Stomach/Bowel: Small periampullary duodenal diverticulum. Normal appendix. Scattered transverse colon diverticula along with diverticulosis of the proximal sigmoid colon. Mid to distal sigmoid colon anastomosis without complicating feature. Vascular/Lymphatic: Mild abdominal aortic atherosclerotic vascular calcification. No pathologic adenopathy observed. Reproductive: Brachytherapy seed implants distributed in the prostate gland. Other: Anterior pelvic lymph node measures 5 mm in short axis on image 68 series 2, formerly 10 mm. This is likely benign. Subcutaneous  edema along the anterior abdominal wall, left greater than right, likely injection related. Musculoskeletal: Bridging spurring of the left sacroiliac joint. Probable bone island in the left iliac bone on image 60 series 2, no change from 2021. Lower lumbar spondylosis and degenerative disc disease with resulting bilateral foraminal impingement at L5-S1. IMPRESSION: 1. Mild bilateral hydroureter without substantial hydronephrosis and without urinary tract calculi. Mild urinary bladder wall thickening, query chronic cystitis. Possibility of low-grade bladder outlet obstruction is raised. 2. Brachytherapy seed implants distributed in the prostate gland. 3. Diffuse hepatic steatosis. 4. Cholelithiasis. 5. Colonic diverticulosis. 6. Lower lumbar spondylosis and degenerative disc disease with resulting bilateral foraminal impingement at L5-S1. 7. Mild aortic valve calcification. 8. Aortic atherosclerosis. Aortic Atherosclerosis (ICD10-I70.0). Electronically Signed   By: Gaylyn Rong M.D.   On: 09/08/2022 21:49   DG Chest Port 1 View  Result Date: 09/08/2022 CLINICAL DATA:  Urinary frequency, back pain, fever EXAM: PORTABLE CHEST 1 VIEW COMPARISON:  09/03/2017 FINDINGS: Platelike scarring/atelectasis in the right lower lobe. Left lung is clear. No pleural effusion or pneumothorax. The heart is normal in size. IMPRESSION: No acute cardiopulmonary disease. Electronically Signed   By: Charline Bills M.D.   On: 09/08/2022 20:09    EKG: Independently reviewed.   Assessment and Plan: * Sepsis secondary to UTI (HCC) Sepsis secondary to UTI. Lactate 0.8 S. Tachycardia improved from 140 to 122 after IVF. Tylenol PRN fever IVF: IVF: 1L bolus in ED and continuous at 150 cc/hr Tele monitor Rocephin UCx pending Tylenol PRN fever  Renal insufficiency Creat 2.2 today up from 1.5 x3 years ago. AKI vs progression of CKD? IVF: 1L bolus in ED at 150 cc/hr Strict intake and output Repeat BMP in AM Bladder  scan to see if he needs foley placement Only 78 cc in bladder post void, so no indication for foley at this point.  HTN (hypertension) Home med rec currently pending  DM2 (diabetes mellitus, type 2) (HCC) Home med rec pending. Mod scale SSI AC/HS for the moment.      Advance Care Planning:   Code Status: Full Code  Consults: None  Family Communication: No family in room  Severity of Illness: The appropriate patient status for this patient is OBSERVATION. Observation status is judged to be reasonable and necessary in order to provide the required intensity of service to ensure the patient's safety. The patient's presenting symptoms, physical exam findings, and initial radiographic and laboratory data in the context of their medical condition is felt to place them at decreased risk for further clinical deterioration. Furthermore, it is anticipated that the patient will be medically stable for discharge  from the hospital within 2 midnights of admission.   Author: Hillary Bow., DO 09/08/2022 11:21 PM  For on call review [email or web address].

## 2022-09-08 NOTE — H&P (Incomplete)
History and Physical    Patient: Alexander Foster IDP:[id number] DOB: 09/03/1950 DOA: 09/08/2022 DOS: the patient was seen and examined on 09/08/2022 PCP: Renaye Rakers, MD  Patient coming from: Home  Chief Complaint:  Chief Complaint  Patient presents with   Urinary Frequency   Fever   HPI: Alexander Foster is a 72 y.o. male with medical history significant of HTN, DM2, prostate CA s/p seed implant.  Pt with 2.5 day h/o back pain, fevers to 102, urinary urgency with some dribbling.  No lower abd / pelvic pain.  No N/V, but does have decreased PO intake.  Was intermittently constipated over past few days but reports large BM today and now this is resolved.  No diarrhea.  In ED: Tachycardic and Febrile.  UA c/w UTI.  CT showing mild B hydro with no stones.  Radiologist questions if low grade urinary outlet obstruction may be present / cause.   Review of Systems: As mentioned in the history of present illness. All other systems reviewed and are negative. Past Medical History:  Diagnosis Date   Anemia    Asthma    As a child   Cancer (HCC)    seed implant for prostate cancer   Diabetes mellitus without complication (HCC)    Hypertension    Past Surgical History:  Procedure Laterality Date   CYSTOSCOPY WITH STENT PLACEMENT Bilateral 01/02/2020   Procedure: CYSTOSCOPY WITH FIREFLY INJECTION;  Surgeon: Rene Paci, MD;  Location: [facility];  Service: Urology;  Laterality: Bilateral;   PROSTATE BIOPSY     RADIOACTIVE SEED IMPLANT  2010   TONSILLECTOMY     WISDOM TOOTH EXTRACTION     Social History:  reports that he has never smoked. He has never used smokeless tobacco. He reports current alcohol use. He reports that he does not use drugs.  No Known Allergies  Family History  Problem Relation Age of Onset   Heart attack Mother    Healthy Father        reports living to 36, without any major health issues    Prior to Admission medications   Medication Sig Start Date  End Date Taking? Authorizing Provider  ANDROGEL PUMP 20.25 MG/ACT (1.62%) GEL Apply 2 Pump topically daily. Applied to each side of chest daily in the morning. 07/01/16   [provider]  aspirin EC 81 MG tablet Take 81 mg by mouth at bedtime. Swallow whole.    [provider]  Iron-FA-B Cmp-C-Biot-Probiotic (FUSION PLUS) CAPS Take 1 capsule by mouth at bedtime.  01/28/14   [provider]  liraglutide (VICTOZA) 18 MG/3ML SOPN Inject 1.8 mg into the skin daily. 07/19/22   Renaye Rakers, MD  metFORMIN (GLUCOPHAGE) 1000 MG tablet Take 1,000 mg by mouth in the morning and at bedtime.     [provider]  montelukast (SINGULAIR) 10 MG tablet Take 10 mg by mouth at bedtime.  08/24/17   [provider]  Multiple Vitamin (MULTIVITAMIN WITH MINERALS) TABS tablet Take 1 tablet by mouth daily.    [provider]  niacin (NIASPAN) 500 MG CR tablet Take 500 mg by mouth at bedtime.    [provider]  tamsulosin (FLOMAX) 0.4 MG CAPS capsule Take 0.4 mg by mouth at bedtime.     [provider]  TRESIBA FLEXTOUCH 200 UNIT/ML FlexTouch Pen Inject 40 Units into the skin daily. 10/04/19   [provider]  TRIBENZOR 20-5-12.5 MG TABS Take 1 tablet by mouth daily. Hold  for 1 week after discharge then restart 01/11/20   Romie Levee, MD  VICTOZA 18 MG/3ML SOPN Inject 1.8 mg into the skin daily.  08/20/14   [provider]    Physical Exam: Vitals:   09/08/22 2145 09/08/22 2155 09/08/22 2200 09/08/22 2215  BP:  (!) 150/84 (!) 168/84   Pulse: (!) 126 (!) 125 (!) 124 (!) 122  Resp:  18 18   Temp:      TempSrc:      SpO2: 99% 99% 100% 98%  Weight:      Height:       Constitutional: NAD, calm, comfortable Respiratory: clear to auscultation bilaterally, no wheezing, no crackles. Normal respiratory effort. No accessory muscle use.  Cardiovascular: Tachycardic Abdomen: no tenderness, no masses palpated. No hepatosplenomegaly. Bowel  sounds positive.  Skin: No foot ulcer, no rash Neurologic: CN 2-12 grossly intact. Sensation intact, DTR normal. Strength 5/5 in all 4.  Psychiatric: Normal judgment and insight. Alert and oriented x 3. Normal mood.   Data Reviewed:    Labs on Admission: I have personally reviewed following labs and imaging studies  CBC: Recent Labs  Lab 09/08/22 2015  WBC 8.2  NEUTROABS 6.6  HGB 12.2*  HCT 36.6*  MCV 82.4  PLT 237   Basic Metabolic Panel: Recent Labs  Lab 09/08/22 2015  NA 138  K 4.0  CL 108  CO2 19*  GLUCOSE 182*  BUN 42*  CREATININE 2.24*  CALCIUM 9.7   GFR: Estimated Creatinine Clearance: 33.6 mL/min (A) (by C-G formula based on SCr of 2.24 mg/dL (H)). Liver Function Tests: Recent Labs  Lab 09/08/22 2015  AST 15  ALT 20  ALKPHOS 48  BILITOT 0.6  PROT 8.3*  ALBUMIN 4.1   No results for input(s): "LIPASE", "AMYLASE" in the last 168 hours. No results for input(s): "AMMONIA" in the last 168 hours. Coagulation Profile: Recent Labs  Lab 09/08/22 2015  INR 1.2   Cardiac Enzymes: No results for input(s): "CKTOTAL", "CKMB", "CKMBINDEX", "TROPONINI" in the last 168 hours. BNP (last 3 results) No results for input(s): "PROBNP" in the last 8760 hours. HbA1C: No results for input(s): "HGBA1C" in the last 72 hours. CBG: No results for input(s): "GLUCAP" in the last 168 hours. Lipid Profile: No results for input(s): "CHOL", "HDL", "LDLCALC", "TRIG", "CHOLHDL", "LDLDIRECT" in the last 72 hours. Thyroid Function Tests: No results for input(s): "TSH", "T4TOTAL", "FREET4", "T3FREE", "THYROIDAB" in the last 72 hours. Anemia Panel: No results for input(s): "VITAMINB12", "FOLATE", "FERRITIN", "TIBC", "IRON", "RETICCTPCT" in the last 72 hours. Urine analysis:    Component Value Date/Time   COLORURINE YELLOW 09/08/2022 2004   APPEARANCEUR CLOUDY (A) 09/08/2022 2004   LABSPEC 1.012 09/08/2022 2004   PHURINE 5.0 09/08/2022 2004   GLUCOSEU NEGATIVE 09/08/2022 2004    HGBUR LARGE (A) 09/08/2022 2004   BILIRUBINUR NEGATIVE 09/08/2022 2004   KETONESUR NEGATIVE 09/08/2022 2004   PROTEINUR 30 (A) 09/08/2022 2004   UROBILINOGEN 0.2 01/13/2009 1959   NITRITE NEGATIVE 09/08/2022 2004   LEUKOCYTESUR LARGE (A) 09/08/2022 2004    Radiological Exams on Admission: CT Renal Stone Study  Result Date: 09/08/2022 CLINICAL DATA:  Urinary frequency, back pain, fever. EXAM: CT ABDOMEN AND PELVIS WITHOUT CONTRAST TECHNIQUE: Multidetector CT imaging of the abdomen and pelvis was performed following the standard protocol without IV contrast. RADIATION DOSE REDUCTION: This exam was performed according to the departmental dose-optimization program which includes automated exposure control, adjustment of the mA and/or kV according to patient size and/or use  of iterative reconstruction technique. COMPARISON:  05/11/2019 FINDINGS: Lower chest: Mild aortic valve calcification. Hepatobiliary: Diffuse hepatic steatosis. 3.0 cm in long axis gallstone in the gallbladder. No biliary dilatation. Pancreas: Unremarkable Spleen: Unremarkable Adrenals/Urinary Tract: 1.1 by 1.0 cm fat density myelolipoma of the right adrenal gland. No further imaging workup of this lesion is indicated. Mild bilateral hydroureter without substantial hydronephrosis and without urinary tract calculi. Mild urinary bladder wall thickening, query chronic cystitis. Stomach/Bowel: Small periampullary duodenal diverticulum. Normal appendix. Scattered transverse colon diverticula along with diverticulosis of the proximal sigmoid colon. Mid to distal sigmoid colon anastomosis without complicating feature. Vascular/Lymphatic: Mild abdominal aortic atherosclerotic vascular calcification. No pathologic adenopathy observed. Reproductive: Brachytherapy seed implants distributed in the prostate gland. Other: Anterior pelvic lymph node measures 5 mm in short axis on image 68 series 2, formerly 10 mm. This is likely benign. Subcutaneous  edema along the anterior abdominal wall, left greater than right, likely injection related. Musculoskeletal: Bridging spurring of the left sacroiliac joint. Probable bone island in the left iliac bone on image 60 series 2, no change from 2021. Lower lumbar spondylosis and degenerative disc disease with resulting bilateral foraminal impingement at L5-S1. IMPRESSION: 1. Mild bilateral hydroureter without substantial hydronephrosis and without urinary tract calculi. Mild urinary bladder wall thickening, query chronic cystitis. Possibility of low-grade bladder outlet obstruction is raised. 2. Brachytherapy seed implants distributed in the prostate gland. 3. Diffuse hepatic steatosis. 4. Cholelithiasis. 5. Colonic diverticulosis. 6. Lower lumbar spondylosis and degenerative disc disease with resulting bilateral foraminal impingement at L5-S1. 7. Mild aortic valve calcification. 8. Aortic atherosclerosis. Aortic Atherosclerosis (ICD10-I70.0). Electronically Signed   By: Gaylyn Rong M.D.   On: 09/08/2022 21:49   DG Chest Port 1 View  Result Date: 09/08/2022 CLINICAL DATA:  Urinary frequency, back pain, fever EXAM: PORTABLE CHEST 1 VIEW COMPARISON:  09/03/2017 FINDINGS: Platelike scarring/atelectasis in the right lower lobe. Left lung is clear. No pleural effusion or pneumothorax. The heart is normal in size. IMPRESSION: No acute cardiopulmonary disease. Electronically Signed   By: Charline Bills M.D.   On: 09/08/2022 20:09    EKG: Independently reviewed.   Assessment and Plan: * Sepsis secondary to UTI (HCC) Sepsis secondary to UTI. Lactate 0.8 S. Tachycardia improved from 140 to 122 after IVF. Tylenol PRN fever IVF: IVF: 1L bolus in ED and continuous at 150 cc/hr Tele monitor Rocephin UCx pending Tylenol PRN fever  Renal insufficiency Creat 2.2 today up from 1.5 x3 years ago. AKI vs progression of CKD? IVF: 1L bolus in ED at 150 cc/hr Strict intake and output Repeat BMP in AM Bladder  scan to see if he needs foley placement Only 78 cc in bladder post void, so no indication for foley at this point.  HTN (hypertension) Home med rec currently pending  DM2 (diabetes mellitus, type 2) (HCC) Home med rec pending. Mod scale SSI AC/HS for the moment.      Advance Care Planning:   Code Status: Full Code  Consults: None  Family Communication: No family in room  Severity of Illness: The appropriate patient status for this patient is OBSERVATION. Observation status is judged to be reasonable and necessary in order to provide the required intensity of service to ensure the patient's safety. The patient's presenting symptoms, physical exam findings, and initial radiographic and laboratory data in the context of their medical condition is felt to place them at decreased risk for further clinical deterioration. Furthermore, it is anticipated that the patient will be medically stable for discharge  from the hospital within 2 midnights of admission.   Author: Hillary Bow., DO 09/08/2022 11:21 PM  For on call review [email or web address].

## 2022-09-08 NOTE — ED Triage Notes (Signed)
Pt reports with urinary frequency, back pain, and fever since Monday. Pt states that he has been having episodes of incontinence as well.

## 2022-09-09 DIAGNOSIS — Z8546 Personal history of malignant neoplasm of prostate: Secondary | ICD-10-CM | POA: Diagnosis not present

## 2022-09-09 DIAGNOSIS — J45909 Unspecified asthma, uncomplicated: Secondary | ICD-10-CM | POA: Diagnosis present

## 2022-09-09 DIAGNOSIS — N390 Urinary tract infection, site not specified: Secondary | ICD-10-CM | POA: Diagnosis present

## 2022-09-09 DIAGNOSIS — N1832 Chronic kidney disease, stage 3b: Secondary | ICD-10-CM | POA: Diagnosis present

## 2022-09-09 DIAGNOSIS — N179 Acute kidney failure, unspecified: Secondary | ICD-10-CM | POA: Diagnosis present

## 2022-09-09 DIAGNOSIS — D631 Anemia in chronic kidney disease: Secondary | ICD-10-CM | POA: Diagnosis present

## 2022-09-09 DIAGNOSIS — Z794 Long term (current) use of insulin: Secondary | ICD-10-CM | POA: Diagnosis not present

## 2022-09-09 DIAGNOSIS — E872 Acidosis, unspecified: Secondary | ICD-10-CM | POA: Diagnosis present

## 2022-09-09 DIAGNOSIS — Z7985 Long-term (current) use of injectable non-insulin antidiabetic drugs: Secondary | ICD-10-CM | POA: Diagnosis not present

## 2022-09-09 DIAGNOSIS — Z8249 Family history of ischemic heart disease and other diseases of the circulatory system: Secondary | ICD-10-CM | POA: Diagnosis not present

## 2022-09-09 DIAGNOSIS — E1122 Type 2 diabetes mellitus with diabetic chronic kidney disease: Secondary | ICD-10-CM | POA: Diagnosis present

## 2022-09-09 DIAGNOSIS — Z1152 Encounter for screening for COVID-19: Secondary | ICD-10-CM | POA: Diagnosis not present

## 2022-09-09 DIAGNOSIS — I129 Hypertensive chronic kidney disease with stage 1 through stage 4 chronic kidney disease, or unspecified chronic kidney disease: Secondary | ICD-10-CM | POA: Diagnosis present

## 2022-09-09 DIAGNOSIS — A419 Sepsis, unspecified organism: Secondary | ICD-10-CM | POA: Diagnosis present

## 2022-09-09 DIAGNOSIS — B961 Klebsiella pneumoniae [K. pneumoniae] as the cause of diseases classified elsewhere: Secondary | ICD-10-CM | POA: Diagnosis present

## 2022-09-09 DIAGNOSIS — Z79899 Other long term (current) drug therapy: Secondary | ICD-10-CM | POA: Diagnosis not present

## 2022-09-09 DIAGNOSIS — R6520 Severe sepsis without septic shock: Secondary | ICD-10-CM | POA: Diagnosis present

## 2022-09-09 DIAGNOSIS — Z7982 Long term (current) use of aspirin: Secondary | ICD-10-CM | POA: Diagnosis not present

## 2022-09-09 DIAGNOSIS — N136 Pyonephrosis: Secondary | ICD-10-CM | POA: Diagnosis present

## 2022-09-09 DIAGNOSIS — Z7984 Long term (current) use of oral hypoglycemic drugs: Secondary | ICD-10-CM | POA: Diagnosis not present

## 2022-09-09 LAB — PROTIME-INR
INR: 1.3 — ABNORMAL HIGH (ref 0.8–1.2)
Prothrombin Time: 16.5 s — ABNORMAL HIGH (ref 11.4–15.2)

## 2022-09-09 LAB — GLUCOSE, CAPILLARY
Glucose-Capillary: 151 mg/dL — ABNORMAL HIGH (ref 70–99)
Glucose-Capillary: 148 mg/dL — ABNORMAL HIGH (ref 70–99)
Glucose-Capillary: 120 mg/dL — ABNORMAL HIGH (ref 70–99)
Glucose-Capillary: 149 mg/dL — ABNORMAL HIGH (ref 70–99)
Glucose-Capillary: 107 mg/dL — ABNORMAL HIGH (ref 70–99)

## 2022-09-09 LAB — BASIC METABOLIC PANEL
Anion gap: 12 (ref 5–15)
BUN: 36 mg/dL — ABNORMAL HIGH (ref 8–23)
CO2: 18 mmol/L — ABNORMAL LOW (ref 22–32)
Calcium: 9.1 mg/dL (ref 8.9–10.3)
Chloride: 107 mmol/L (ref 98–111)
Creatinine, Ser: 2.15 mg/dL — ABNORMAL HIGH (ref 0.61–1.24)
GFR, Estimated: 32 mL/min — ABNORMAL LOW (ref >60)
Glucose, Bld: 146 mg/dL — ABNORMAL HIGH (ref 70–99)
Potassium: 3.6 mmol/L (ref 3.5–5.1)
Sodium: 137 mmol/L (ref 135–145)

## 2022-09-09 LAB — CBC
HCT: 33.2 % — ABNORMAL LOW (ref 39.0–52.0)
Hemoglobin: 11.1 g/dL — ABNORMAL LOW (ref 13.0–17.0)
MCH: 27.6 pg (ref 26.0–34.0)
MCHC: 33.4 g/dL (ref 30.0–36.0)
MCV: 82.6 fL (ref 80.0–100.0)
Platelets: 202 10*3/uL (ref 150–400)
RBC: 4.02 MIL/uL — ABNORMAL LOW (ref 4.22–5.81)
RDW: 12.8 % (ref 11.5–15.5)
WBC: 7 10*3/uL (ref 4.0–10.5)
nRBC: 0 % (ref 0.0–0.2)

## 2022-09-09 LAB — HEMOGLOBIN A1C
Hgb A1c MFr Bld: 6.6 % — ABNORMAL HIGH (ref 4.8–5.6)
Mean Plasma Glucose: 142.72 mg/dL

## 2022-09-09 LAB — CORTISOL-AM, BLOOD: Cortisol - AM: 11.9 ug/dL (ref 6.7–22.6)

## 2022-09-09 LAB — PROCALCITONIN: Procalcitonin: 0.41 ng/mL

## 2022-09-09 LAB — CBG MONITORING, ED: Glucose-Capillary: 163 mg/dL — ABNORMAL HIGH (ref 70–99)

## 2022-09-09 MED ORDER — FE FUM-VIT C-VIT B12-FA 460-60-0.01-1 MG PO CAPS
1.0000 | ORAL_CAPSULE | Freq: Every day | ORAL | Status: DC
  Administered 2022-09-09 – 2022-09-11 (×3): 1 via ORAL
  Filled 2022-09-09 (×5): qty 1

## 2022-09-09 NOTE — Progress Notes (Signed)
Mobility Specialist - Progress Note   09/09/22 1033  Mobility  Activity Ambulated independently in hallway  Level of Assistance Standby assist, set-up cues, supervision of patient - no hands on  Assistive Device Other (Comment) (IV Pole)  Distance Ambulated (ft) 350 ft  Range of Motion/Exercises Active  Activity Response Tolerated well  Mobility Referral Yes  $Mobility charge 1 Mobility  Mobility Specialist Start Time (ACUTE ONLY) 1020  Mobility Specialist Stop Time (ACUTE ONLY) 1033  Mobility Specialist Time Calculation (min) (ACUTE ONLY) 13 min   Pt was found in bed and agreeable to ambulate. Was a little unsteady with the first couple of steps and had x1 LOB that he was able to self correct. Stated feeling a little SOB. At EOS returned to bed with all needs met. Call bell in reach.  Billey Chang Mobility Specialist

## 2022-09-09 NOTE — Plan of Care (Signed)
  Problem: Education: Goal: Knowledge of General Education information will improve Description: Including pain rating scale, medication(s)/side effects and non-pharmacologic comfort measures Outcome: Progressing   Problem: Health Behavior/Discharge Planning: Goal: Ability to manage health-related needs will improve Outcome: Progressing   Problem: Activity: Goal: Risk for activity intolerance will decrease Outcome: Progressing   Problem: Nutrition: Goal: Adequate nutrition will be maintained Outcome: Progressing   Problem: Coping: Goal: Level of anxiety will decrease Outcome: Progressing   Problem: Pain Managment: Goal: General experience of comfort will improve Outcome: Progressing   Problem: Safety: Goal: Ability to remain free from injury will improve Outcome: Progressing   Problem: Skin Integrity: Goal: Risk for impaired skin integrity will decrease Outcome: Progressing   Problem: Education: Goal: Knowledge of General Education information will improve Description: Including pain rating scale, medication(s)/side effects and non-pharmacologic comfort measures Outcome: Progressing   Problem: Health Behavior/Discharge Planning: Goal: Ability to manage health-related needs will improve Outcome: Progressing

## 2022-09-09 NOTE — Progress Notes (Signed)
PROGRESS NOTE    Alexander Foster  WUJ:[id number] DOB: 1950-08-04 DOA: 09/08/2022 PCP: Renaye Rakers, MD    Brief Narrative:  72 year old with history of essential hypertension, type 2 diabetes, prostate cancer status post seed implant presented to the ER with 3 days of back pain, temperature 102, urinary frequency and urgency with dribbling.  In the emergency room tachycardic and febrile.  UA abnormal.  CT scan with mild bilateral hydronephrosis likely due to prostatism.  Admitted secondary to UTI and sepsis.   Assessment & Plan:   Acute UTI present on admission, sepsis present on admission due to tachycardia, tachypnea and fever with possible source of infection urine. Continue Rocephin pending urine cultures and blood cultures.  Rule out retention, check postvoid residual urine. Continue Flomax. Continue maintenance IV fluids today.  Acute kidney injury on CKD stage IIIb: Due to above.  Renal functions already improving.  Monitor.  Chronic medical issues including Hypertension, stable Type 2 diabetes, stable.  On metformin and Farxiga at home.  Holding.  Continue Victoza, Lantus and sliding scale insulin. History of prostate cancer, followed by his urologist.  He has an appointment today.  Will rule out prostatitis.     DVT prophylaxis: enoxaparin (LOVENOX) injection 40 mg Start: 09/09/22 1000   Code Status: Full code Family Communication: None at the bedside Disposition Plan: Status is: Observation The patient will require care spanning > 2 midnights and should be moved to inpatient because: IV antibiotics     Consultants:  None  Procedures:  None  Antimicrobials:  Rocephin 8/14---   Subjective: Patient seen in the morning rounds.  Temperature 102 overnight.  Currently feels better.  Patient tells me he is about 60% improved.  Urine flow has improved.  Denies any retention of urine.  Denies any nausea vomiting.  Objective: Vitals:   09/09/22 0258 09/09/22 0415  09/09/22 0826 09/09/22 1148  BP: 131/76 110/67 (!) 148/77 (!) 143/84  Pulse: (!) 110 (!) 108 (!) 101 (!) 107  Resp: 20  17 17   Temp: (!) 100.5 F (38.1 C) 99 F (37.2 C) 99.7 F (37.6 C) 99.7 F (37.6 C)  TempSrc: Oral Oral Oral Oral  SpO2: 97% 99% 100% 100%  Weight:      Height:        Intake/Output Summary (Last 24 hours) at 09/09/2022 1236 Last data filed at 09/09/2022 1140 Gross per 24 hour  Intake 2574 ml  Output 952 ml  Net 1622 ml   Filed Weights   09/08/22 1956 09/09/22 0205  Weight: 92.5 kg 91.8 kg    Examination:  General exam: Appears calm and comfortable  On room air. Alert awake and oriented.  No focal logical deficits. Respiratory system: No added sounds. Cardiovascular system: S1 & S2 heard, RRR.  Gastrointestinal system: Soft.  Nontender.  No suprapubic fullness or tenderness.  Bowel sound present. Central nervous system: Alert and oriented. No focal neurological deficits. Extremities: Symmetric 5 x 5 power. Skin: No rashes, lesions or ulcers Psychiatry: Judgement and insight appear normal. Mood & affect appropriate.     Data Reviewed: I have personally reviewed following labs and imaging studies  CBC: Recent Labs  Lab 09/08/22 2015 09/09/22 0400  WBC 8.2 7.0  NEUTROABS 6.6  --   HGB 12.2* 11.1*  HCT 36.6* 33.2*  MCV 82.4 82.6  PLT 237 202   Basic Metabolic Panel: Recent Labs  Lab 09/08/22 2015 09/09/22 0400  NA 138 137  K 4.0 3.6  CL 108 107  CO2 19* 18*  GLUCOSE 182* 146*  BUN 42* 36*  CREATININE 2.24* 2.15*  CALCIUM 9.7 9.1   GFR: Estimated Creatinine Clearance: 34.8 mL/min (A) (by C-G formula based on SCr of 2.15 mg/dL (H)). Liver Function Tests: Recent Labs  Lab 09/08/22 2015  AST 15  ALT 20  ALKPHOS 48  BILITOT 0.6  PROT 8.3*  ALBUMIN 4.1   No results for input(s): "LIPASE", "AMYLASE" in the last 168 hours. No results for input(s): "AMMONIA" in the last 168 hours. Coagulation Profile: Recent Labs  Lab  09/08/22 2015 09/09/22 0400  INR 1.2 1.3*   Cardiac Enzymes: No results for input(s): "CKTOTAL", "CKMB", "CKMBINDEX", "TROPONINI" in the last 168 hours. BNP (last 3 results) No results for input(s): "PROBNP" in the last 8760 hours. HbA1C: Recent Labs    09/09/22 0400  HGBA1C 6.6*   CBG: Recent Labs  Lab 09/09/22 0007 09/09/22 0136 09/09/22 0711 09/09/22 1148  GLUCAP 163* 151* 148* 120*   Lipid Profile: No results for input(s): "CHOL", "HDL", "LDLCALC", "TRIG", "CHOLHDL", "LDLDIRECT" in the last 72 hours. Thyroid Function Tests: No results for input(s): "TSH", "T4TOTAL", "FREET4", "T3FREE", "THYROIDAB" in the last 72 hours. Anemia Panel: No results for input(s): "VITAMINB12", "FOLATE", "FERRITIN", "TIBC", "IRON", "RETICCTPCT" in the last 72 hours. Sepsis Labs: Recent Labs  Lab 09/08/22 2056 09/09/22 0400  PROCALCITON  --  0.41  LATICACIDVEN 0.8  --     Recent Results (from the past 240 hour(s))  Culture, blood (Routine x 2)     Status: None (Preliminary result)   Collection Time: 09/08/22  8:15 PM   Specimen: BLOOD LEFT HAND  Result Value Ref Range Status   Specimen Description   Final    BLOOD LEFT HAND Performed at [facility], [address], [place], [place] [address]    Special Requests   Final    BOTTLES DRAWN AEROBIC AND ANAEROBIC Blood Culture results may not be optimal due to an inadequate volume of blood received in culture bottles Performed at [facility], [address], [place], [place] [address]    Culture   Final    NO GROWTH < 12 HOURS Performed at [facility], [address] [place], [place] [address]    Report Status PENDING  Incomplete  Resp panel by RT-PCR (RSV, Flu A&B, Covid) Anterior Nasal Swab     Status: None   Collection Time: 09/08/22  8:51 PM   Specimen: Anterior Nasal Swab  Result Value Ref Range Status   SARS Coronavirus 2 by RT PCR NEGATIVE NEGATIVE Final    Comment: (NOTE) SARS-CoV-2  target nucleic acids are NOT DETECTED.  The SARS-CoV-2 RNA is generally detectable in upper respiratory specimens during the acute phase of infection. The lowest concentration of SARS-CoV-2 viral copies this assay can detect is 138 copies/mL. A negative result does not preclude SARS-Cov-2 infection and should not be used as the sole basis for treatment or other patient management decisions. A negative result may occur with  improper specimen collection/handling, submission of specimen other than nasopharyngeal swab, presence of viral mutation(s) within the areas targeted by this assay, and inadequate number of viral copies(<138 copies/mL). A negative result must be combined with clinical observations, patient history, and epidemiological information. The expected result is Negative.  Fact Sheet for Patients:  BloggerCourse.com  Fact Sheet for Healthcare Providers:  SeriousBroker.it  This test is no t yet approved or cleared by the [place] and  has been authorized  for detection and/or diagnosis of SARS-CoV-2 by FDA under an Emergency Use Authorization (EUA). This EUA will remain  in effect (meaning this test can be used) for the duration of the COVID-19 declaration under Section 564(b)(1) of the Act, 21 U.S.C.section 360bbb-3(b)(1), unless the authorization is terminated  or revoked sooner.       Influenza A by PCR NEGATIVE NEGATIVE Final   Influenza B by PCR NEGATIVE NEGATIVE Final    Comment: (NOTE) The Xpert Xpress SARS-CoV-2/FLU/RSV plus assay is intended as an aid in the diagnosis of influenza from Nasopharyngeal swab specimens and should not be used as a sole basis for treatment. Nasal washings and aspirates are unacceptable for Xpert Xpress SARS-CoV-2/FLU/RSV testing.  Fact Sheet for Patients: BloggerCourse.com  Fact Sheet for Healthcare  Providers: SeriousBroker.it  This test is not yet approved or cleared by the [place] FDA and has been authorized for detection and/or diagnosis of SARS-CoV-2 by FDA under an Emergency Use Authorization (EUA). This EUA will remain in effect (meaning this test can be used) for the duration of the COVID-19 declaration under Section 564(b)(1) of the Act, 21 U.S.C. section 360bbb-3(b)(1), unless the authorization is terminated or revoked.     Resp Syncytial Virus by PCR NEGATIVE NEGATIVE Final    Comment: (NOTE) Fact Sheet for Patients: BloggerCourse.com  Fact Sheet for Healthcare Providers: SeriousBroker.it  This test is not yet approved or cleared by the [place] FDA and has been authorized for detection and/or diagnosis of SARS-CoV-2 by FDA under an Emergency Use Authorization (EUA). This EUA will remain in effect (meaning this test can be used) for the duration of the COVID-19 declaration under Section 564(b)(1) of the Act, 21 U.S.C. section 360bbb-3(b)(1), unless the authorization is terminated or revoked.  Performed at [facility], [address] [place], [place] [address]          Radiology Studies: CT Renal Stone Study  Result Date: 09/08/2022 CLINICAL DATA:  Urinary frequency, back pain, fever. EXAM: CT ABDOMEN AND PELVIS WITHOUT CONTRAST TECHNIQUE: Multidetector CT imaging of the abdomen and pelvis was performed following the standard protocol without IV contrast. RADIATION DOSE REDUCTION: This exam was performed according to the departmental dose-optimization program which includes automated exposure control, adjustment of the mA and/or kV according to patient size and/or use of iterative reconstruction technique. COMPARISON:  05/11/2019 FINDINGS: Lower chest: Mild aortic valve calcification. Hepatobiliary: Diffuse hepatic steatosis. 3.0 cm in long axis gallstone in  the gallbladder. No biliary dilatation. Pancreas: Unremarkable Spleen: Unremarkable Adrenals/Urinary Tract: 1.1 by 1.0 cm fat density myelolipoma of the right adrenal gland. No further imaging workup of this lesion is indicated. Mild bilateral hydroureter without substantial hydronephrosis and without urinary tract calculi. Mild urinary bladder wall thickening, query chronic cystitis. Stomach/Bowel: Small periampullary duodenal diverticulum. Normal appendix. Scattered transverse colon diverticula along with diverticulosis of the proximal sigmoid colon. Mid to distal sigmoid colon anastomosis without complicating feature. Vascular/Lymphatic: Mild abdominal aortic atherosclerotic vascular calcification. No pathologic adenopathy observed. Reproductive: Brachytherapy seed implants distributed in the prostate gland. Other: Anterior pelvic lymph node measures 5 mm in short axis on image 68 series 2, formerly 10 mm. This is likely benign. Subcutaneous edema along the anterior abdominal wall, left greater than right, likely injection related. Musculoskeletal: Bridging spurring of the left sacroiliac joint. Probable bone island in the left iliac bone on image 60 series 2, no change from 2021. Lower lumbar spondylosis and degenerative disc disease with resulting bilateral foraminal impingement at L5-S1. IMPRESSION: 1. Mild  bilateral hydroureter without substantial hydronephrosis and without urinary tract calculi. Mild urinary bladder wall thickening, query chronic cystitis. Possibility of low-grade bladder outlet obstruction is raised. 2. Brachytherapy seed implants distributed in the prostate gland. 3. Diffuse hepatic steatosis. 4. Cholelithiasis. 5. Colonic diverticulosis. 6. Lower lumbar spondylosis and degenerative disc disease with resulting bilateral foraminal impingement at L5-S1. 7. Mild aortic valve calcification. 8. Aortic atherosclerosis. Aortic Atherosclerosis (ICD10-I70.0). Electronically Signed   By: Gaylyn Rong M.D.   On: 09/08/2022 21:49   DG Chest Port 1 View  Result Date: 09/08/2022 CLINICAL DATA:  Urinary frequency, back pain, fever EXAM: PORTABLE CHEST 1 VIEW COMPARISON:  09/03/2017 FINDINGS: Platelike scarring/atelectasis in the right lower lobe. Left lung is clear. No pleural effusion or pneumothorax. The heart is normal in size. IMPRESSION: No acute cardiopulmonary disease. Electronically Signed   By: Charline Bills M.D.   On: 09/08/2022 20:09        Scheduled Meds:  aspirin EC  81 mg Oral QHS   enoxaparin (LOVENOX) injection  40 mg Subcutaneous Q24H   Fe Fum-Vit C-Vit B12-FA  1 capsule Oral QHS   insulin aspart  0-15 Units Subcutaneous TID WC   insulin aspart  0-5 Units Subcutaneous QHS   insulin glargine-yfgn  8 Units Subcutaneous Daily   liraglutide  1.8 mg Subcutaneous Daily   montelukast  10 mg Oral QHS   tamsulosin  0.4 mg Oral QHS   Testosterone  2 Pump Topical Daily   Continuous Infusions:  cefTRIAXone (ROCEPHIN)  IV Stopped (09/08/22 2131)   lactated ringers Stopped (09/09/22 0009)   lactated ringers 150 mL/hr (09/09/22 0715)     LOS: 0 days    Time spent: 35 minutes    Dorcas Carrow, MD [facility]

## 2022-09-10 DIAGNOSIS — N390 Urinary tract infection, site not specified: Secondary | ICD-10-CM | POA: Diagnosis not present

## 2022-09-10 DIAGNOSIS — A419 Sepsis, unspecified organism: Secondary | ICD-10-CM | POA: Diagnosis not present

## 2022-09-10 LAB — GLUCOSE, CAPILLARY
Glucose-Capillary: 107 mg/dL — ABNORMAL HIGH (ref 70–99)
Glucose-Capillary: 151 mg/dL — ABNORMAL HIGH (ref 70–99)
Glucose-Capillary: 168 mg/dL — ABNORMAL HIGH (ref 70–99)
Glucose-Capillary: 185 mg/dL — ABNORMAL HIGH (ref 70–99)

## 2022-09-10 MED ORDER — SENNOSIDES-DOCUSATE SODIUM 8.6-50 MG PO TABS
1.0000 | ORAL_TABLET | Freq: Two times a day (BID) | ORAL | Status: DC
  Administered 2022-09-10 – 2022-09-12 (×5): 1 via ORAL
  Filled 2022-09-10 (×5): qty 1

## 2022-09-10 MED ORDER — PIPERACILLIN-TAZOBACTAM 3.375 G IVPB
3.3750 g | Freq: Three times a day (TID) | INTRAVENOUS | Status: DC
  Administered 2022-09-10 – 2022-09-12 (×6): 3.375 g via INTRAVENOUS
  Filled 2022-09-10 (×6): qty 50

## 2022-09-10 NOTE — TOC CM/SW Note (Signed)
Transition of Care [facility]) - Inpatient Brief Assessment   Patient Details  Name: Alexander Foster MRN: [id number] Date of Birth: 08-24-1950  [facility] CM/SW Contact:    Howell Rucks, RN Phone Number: 09/10/2022, 1:00 PM   Clinical Narrative: Met with pt at bedside to introduce role of TOC/NCM and review for dc planning. Pt reports he has a PCP and pharmacy in place, no home care services or home DME, reports he feels safe returning to his home with support from his family, reports family will provide transportation at discharge. TOC Brief Assessment completed. No TOC needs identified.     Transition of Care Asessment: Insurance and Status: Insurance coverage has been reviewed Patient has primary care physician: Yes Home environment has been reviewed: resides with spouse in private residence Prior level of function:: In dependent Prior/Current Home Services: No current home services Social Determinants of Health Reivew: SDOH reviewed no interventions necessary Readmission risk has been reviewed: Yes Transition of care needs: no transition of care needs at this time

## 2022-09-10 NOTE — Progress Notes (Signed)
Pharmacy Antibiotic Note  Alexander Foster is a 72 y.o. male with hx prostate cancer (s/p seed implant) who presented to the ED on 09/08/2022 with c/o back pain, fever and urinary frequency. He was started on ceftriaxone on admission for suspected sepsis secondary to UTI.  Pharmacy has been consulted on 8/16 to change abx to zosyn for prostatitis.  Today, 09/10/2022: - Tmax 103.1 - wbc 7 - scr 2.15 on 8/15 (crcl~35) - ucx with >100K GNR  Plan: - zosyn 3.375 gm IV q8h (infuse over 4 hours)  ____________________________________________  Height: 5\' 10"  (177.8 cm) Weight: 91.8 kg (202 lb 6.1 oz) IBW/kg (Calculated) : 73  Temp (24hrs), Avg:100.5 F (38.1 C), Min:99.2 F (37.3 C), Max:103.1 F (39.5 C)  Recent Labs  Lab 09/08/22 2015 09/08/22 2056 09/09/22 0400  WBC 8.2  --  7.0  CREATININE 2.24*  --  2.15*  LATICACIDVEN  --  0.8  --     Estimated Creatinine Clearance: 34.8 mL/min (A) (by C-G formula based on SCr of 2.15 mg/dL (H)).    No Known Allergies  8/14 CTX>>8/16 8/16 zosyn>>   8/14 bcx x1:  8/14 ucx: >100K  GNR  Thank you for allowing pharmacy to be a part of this patient's care.  Lucia Gaskins 09/10/2022 10:08 AM

## 2022-09-10 NOTE — Progress Notes (Signed)
PROGRESS NOTE    Alexander Foster  ZOX:[id number] DOB: 17-Jun-1950 DOA: 09/08/2022 PCP: Renaye Rakers, MD    Brief Narrative:  72 year old with history of essential hypertension, type 2 diabetes, prostate cancer status post seed implant presented to the ER with 3 days of back pain, temperature 102, urinary frequency and urgency with dribbling.  In the emergency room tachycardic and febrile.  UA abnormal.  CT scan with mild bilateral hydronephrosis likely due to prostatism.  Admitted secondary to UTI and sepsis. Patient remains persistently febrile.   Assessment & Plan:   Acute UTI present on admission, sepsis present on admission due to tachycardia, tachypnea and fever with possible source of infection urine.  Also possibility of prostatitis. Urine cultures and blood cultures negative so far.  Patient remained febrile despite 48 hours of Rocephin. Will change to Zosyn today.  Draw a repeat cultures when fever more than 101. Continue Flomax. No evidence of significant retention.  Acute kidney injury on CKD stage IIIb: Due to above.  Renal functions already improving.  Monitor.  Chronic medical issues including Hypertension, stable Type 2 diabetes, stable.  On metformin and Farxiga at home.  Holding.  Continue Victoza, Lantus and sliding scale insulin. History of prostate cancer, followed by his urologist.    DVT prophylaxis: enoxaparin (LOVENOX) injection 40 mg Start: 09/09/22 1000   Code Status: Full code Family Communication: None at the bedside Disposition Plan: Status is: Inpatient.  Remains inpatient, persistent fever.  Needing IV antibiotics.    Consultants:  None  Procedures:  None  Antimicrobials:  Rocephin 8/14--- 8/16 Zosyn 8/16---   Subjective:  Patient seen and examined.  He has hard stool.  Denies any difficulty urinating.  Temperature 103 last evening with some chills.  Last temperature was 99.  Objective: Vitals:   09/09/22 1754 09/09/22 2021 09/10/22  0530 09/10/22 1326  BP:  136/71 (!) 149/79 109/74  Pulse:  76 98 99  Resp:  20 20   Temp: 99.2 F (37.3 C) 99.8 F (37.7 C) 99.2 F (37.3 C) 99.1 F (37.3 C)  TempSrc: Oral Oral Oral Oral  SpO2:  100% 100% 100%  Weight:      Height:        Intake/Output Summary (Last 24 hours) at 09/10/2022 1345 Last data filed at 09/10/2022 0651 Gross per 24 hour  Intake 2022.89 ml  Output 1547 ml  Net 475.89 ml   Filed Weights   09/08/22 1956 09/09/22 0205  Weight: 92.5 kg 91.8 kg    Examination:  General exam: Appears calm and comfortable  On room air. Respiratory system: No added sounds. Cardiovascular system: S1 & S2 heard, RRR.  Gastrointestinal system: Soft.  Nontender.  No suprapubic fullness or tenderness.  Bowel sound present. Central nervous system: Alert and oriented. No focal neurological deficits. Extremities: Symmetric 5 x 5 power. Skin: No rashes, lesions or ulcers Psychiatry: Judgement and insight appear normal. Mood & affect appropriate.     Data Reviewed: I have personally reviewed following labs and imaging studies  CBC: Recent Labs  Lab 09/08/22 2015 09/09/22 0400  WBC 8.2 7.0  NEUTROABS 6.6  --   HGB 12.2* 11.1*  HCT 36.6* 33.2*  MCV 82.4 82.6  PLT 237 202   Basic Metabolic Panel: Recent Labs  Lab 09/08/22 2015 09/09/22 0400  NA 138 137  K 4.0 3.6  CL 108 107  CO2 19* 18*  GLUCOSE 182* 146*  BUN 42* 36*  CREATININE 2.24* 2.15*  CALCIUM 9.7 9.1  GFR: Estimated Creatinine Clearance: 34.8 mL/min (A) (by C-G formula based on SCr of 2.15 mg/dL (H)). Liver Function Tests: Recent Labs  Lab 09/08/22 2015  AST 15  ALT 20  ALKPHOS 48  BILITOT 0.6  PROT 8.3*  ALBUMIN 4.1   No results for input(s): "LIPASE", "AMYLASE" in the last 168 hours. No results for input(s): "AMMONIA" in the last 168 hours. Coagulation Profile: Recent Labs  Lab 09/08/22 2015 09/09/22 0400  INR 1.2 1.3*   Cardiac Enzymes: No results for input(s): "CKTOTAL",  "CKMB", "CKMBINDEX", "TROPONINI" in the last 168 hours. BNP (last 3 results) No results for input(s): "PROBNP" in the last 8760 hours. HbA1C: Recent Labs    09/09/22 0400  HGBA1C 6.6*   CBG: Recent Labs  Lab 09/09/22 1148 09/09/22 1707 09/09/22 2022 09/10/22 0733 09/10/22 1324  GLUCAP 120* 149* 107* 107* 151*   Lipid Profile: No results for input(s): "CHOL", "HDL", "LDLCALC", "TRIG", "CHOLHDL", "LDLDIRECT" in the last 72 hours. Thyroid Function Tests: No results for input(s): "TSH", "T4TOTAL", "FREET4", "T3FREE", "THYROIDAB" in the last 72 hours. Anemia Panel: No results for input(s): "VITAMINB12", "FOLATE", "FERRITIN", "TIBC", "IRON", "RETICCTPCT" in the last 72 hours. Sepsis Labs: Recent Labs  Lab 09/08/22 2056 09/09/22 0400  PROCALCITON  --  0.41  LATICACIDVEN 0.8  --     Recent Results (from the past 240 hour(s))  Urine Culture     Status: Abnormal (Preliminary result)   Collection Time: 09/08/22  8:04 PM   Specimen: Urine, Random  Result Value Ref Range Status   Specimen Description   Final    URINE, RANDOM Performed at [facility], [address], [place], [place] [address]    Special Requests   Final    NONE Reflexed from [id number] Performed at [facility], [address], [place], [place] [address]    Culture (A)  Final    >=100,000 COLONIES/mL KLEBSIELLA AEROGENES SUSCEPTIBILITIES TO FOLLOW Performed at [facility], [address] [place], [place] [address]    Report Status PENDING  Incomplete  Culture, blood (Routine x 2)     Status: None (Preliminary result)   Collection Time: 09/08/22  8:15 PM   Specimen: BLOOD LEFT HAND  Result Value Ref Range Status   Specimen Description   Final    BLOOD LEFT HAND Performed at [facility], [address] [place], [place] [address]    Special Requests   Final    BOTTLES DRAWN AEROBIC AND ANAEROBIC Blood Culture results may not be optimal due to an  inadequate volume of blood received in culture bottles Performed at [facility], [address], [place], [place] [address]    Culture   Final    NO GROWTH 2 DAYS Performed at [facility], [address] [place], [place] [address]    Report Status PENDING  Incomplete  Resp panel by RT-PCR (RSV, Flu A&B, Covid) Anterior Nasal Swab     Status: None   Collection Time: 09/08/22  8:51 PM   Specimen: Anterior Nasal Swab  Result Value Ref Range Status   SARS Coronavirus 2 by RT PCR NEGATIVE NEGATIVE Final    Comment: (NOTE) SARS-CoV-2 target nucleic acids are NOT DETECTED.  The SARS-CoV-2 RNA is generally detectable in upper respiratory specimens during the acute phase of infection. The lowest concentration of SARS-CoV-2 viral copies this assay can detect is 138 copies/mL. A negative result does not preclude SARS-Cov-2 infection and should not  be used as the sole basis for treatment or other patient management decisions. A negative result may occur with  improper specimen collection/handling, submission of specimen other than nasopharyngeal swab, presence of viral mutation(s) within the areas targeted by this assay, and inadequate number of viral copies(<138 copies/mL). A negative result must be combined with clinical observations, patient history, and epidemiological information. The expected result is Negative.  Fact Sheet for Patients:  BloggerCourse.com  Fact Sheet for Healthcare Providers:  SeriousBroker.it  This test is no t yet approved or cleared by the [place] FDA and  has been authorized for detection and/or diagnosis of SARS-CoV-2 by FDA under an Emergency Use Authorization (EUA). This EUA will remain  in effect (meaning this test can be used) for the duration of the COVID-19 declaration under Section 564(b)(1) of the Act, 21 U.S.C.section 360bbb-3(b)(1), unless the authorization is  terminated  or revoked sooner.       Influenza A by PCR NEGATIVE NEGATIVE Final   Influenza B by PCR NEGATIVE NEGATIVE Final    Comment: (NOTE) The Xpert Xpress SARS-CoV-2/FLU/RSV plus assay is intended as an aid in the diagnosis of influenza from Nasopharyngeal swab specimens and should not be used as a sole basis for treatment. Nasal washings and aspirates are unacceptable for Xpert Xpress SARS-CoV-2/FLU/RSV testing.  Fact Sheet for Patients: BloggerCourse.com  Fact Sheet for Healthcare Providers: SeriousBroker.it  This test is not yet approved or cleared by the [place] FDA and has been authorized for detection and/or diagnosis of SARS-CoV-2 by FDA under an Emergency Use Authorization (EUA). This EUA will remain in effect (meaning this test can be used) for the duration of the COVID-19 declaration under Section 564(b)(1) of the Act, 21 U.S.C. section 360bbb-3(b)(1), unless the authorization is terminated or revoked.     Resp Syncytial Virus by PCR NEGATIVE NEGATIVE Final    Comment: (NOTE) Fact Sheet for Patients: BloggerCourse.com  Fact Sheet for Healthcare Providers: SeriousBroker.it  This test is not yet approved or cleared by the [place] FDA and has been authorized for detection and/or diagnosis of SARS-CoV-2 by FDA under an Emergency Use Authorization (EUA). This EUA will remain in effect (meaning this test can be used) for the duration of the COVID-19 declaration under Section 564(b)(1) of the Act, 21 U.S.C. section 360bbb-3(b)(1), unless the authorization is terminated or revoked.  Performed at [facility], [address], [place], [place] [address]          Radiology Studies: CT Renal Stone Study  Result Date: 09/08/2022 CLINICAL DATA:  Urinary frequency, back pain, fever. EXAM: CT ABDOMEN AND PELVIS WITHOUT CONTRAST  TECHNIQUE: Multidetector CT imaging of the abdomen and pelvis was performed following the standard protocol without IV contrast. RADIATION DOSE REDUCTION: This exam was performed according to the departmental dose-optimization program which includes automated exposure control, adjustment of the mA and/or kV according to patient size and/or use of iterative reconstruction technique. COMPARISON:  05/11/2019 FINDINGS: Lower chest: Mild aortic valve calcification. Hepatobiliary: Diffuse hepatic steatosis. 3.0 cm in long axis gallstone in the gallbladder. No biliary dilatation. Pancreas: Unremarkable Spleen: Unremarkable Adrenals/Urinary Tract: 1.1 by 1.0 cm fat density myelolipoma of the right adrenal gland. No further imaging workup of this lesion is indicated. Mild bilateral hydroureter without substantial hydronephrosis and without urinary tract calculi. Mild urinary bladder wall thickening, query chronic cystitis. Stomach/Bowel: Small periampullary duodenal diverticulum. Normal appendix. Scattered transverse colon diverticula along with diverticulosis of the proximal sigmoid colon. Mid to distal sigmoid colon anastomosis  without complicating feature. Vascular/Lymphatic: Mild abdominal aortic atherosclerotic vascular calcification. No pathologic adenopathy observed. Reproductive: Brachytherapy seed implants distributed in the prostate gland. Other: Anterior pelvic lymph node measures 5 mm in short axis on image 68 series 2, formerly 10 mm. This is likely benign. Subcutaneous edema along the anterior abdominal wall, left greater than right, likely injection related. Musculoskeletal: Bridging spurring of the left sacroiliac joint. Probable bone island in the left iliac bone on image 60 series 2, no change from 2021. Lower lumbar spondylosis and degenerative disc disease with resulting bilateral foraminal impingement at L5-S1. IMPRESSION: 1. Mild bilateral hydroureter without substantial hydronephrosis and without  urinary tract calculi. Mild urinary bladder wall thickening, query chronic cystitis. Possibility of low-grade bladder outlet obstruction is raised. 2. Brachytherapy seed implants distributed in the prostate gland. 3. Diffuse hepatic steatosis. 4. Cholelithiasis. 5. Colonic diverticulosis. 6. Lower lumbar spondylosis and degenerative disc disease with resulting bilateral foraminal impingement at L5-S1. 7. Mild aortic valve calcification. 8. Aortic atherosclerosis. Aortic Atherosclerosis (ICD10-I70.0). Electronically Signed   By: Gaylyn Rong M.D.   On: 09/08/2022 21:49   DG Chest Port 1 View  Result Date: 09/08/2022 CLINICAL DATA:  Urinary frequency, back pain, fever EXAM: PORTABLE CHEST 1 VIEW COMPARISON:  09/03/2017 FINDINGS: Platelike scarring/atelectasis in the right lower lobe. Left lung is clear. No pleural effusion or pneumothorax. The heart is normal in size. IMPRESSION: No acute cardiopulmonary disease. Electronically Signed   By: Charline Bills M.D.   On: 09/08/2022 20:09        Scheduled Meds:  aspirin EC  81 mg Oral QHS   enoxaparin (LOVENOX) injection  40 mg Subcutaneous Q24H   Fe Fum-Vit C-Vit B12-FA  1 capsule Oral QHS   insulin aspart  0-15 Units Subcutaneous TID WC   insulin aspart  0-5 Units Subcutaneous QHS   insulin glargine-yfgn  8 Units Subcutaneous Daily   liraglutide  1.8 mg Subcutaneous Daily   montelukast  10 mg Oral QHS   senna-docusate  1 tablet Oral BID   tamsulosin  0.4 mg Oral QHS   Testosterone  2 Pump Topical Daily   Continuous Infusions:  piperacillin-tazobactam (ZOSYN)  IV 3.375 g (09/10/22 1103)     LOS: 1 day    Time spent: 35 minutes    Dorcas Carrow, MD [facility]

## 2022-09-10 NOTE — Plan of Care (Signed)
  Problem: Education: Goal: Knowledge of General Education information will improve Description: Including pain rating scale, medication(s)/side effects and non-pharmacologic comfort measures Outcome: Progressing   Problem: Health Behavior/Discharge Planning: Goal: Ability to manage health-related needs will improve Outcome: Progressing   Problem: Clinical Measurements: Goal: Respiratory complications will improve Outcome: Progressing Goal: Cardiovascular complication will be avoided Outcome: Progressing   Problem: Activity: Goal: Risk for activity intolerance will decrease Outcome: Progressing   Problem: Nutrition: Goal: Adequate nutrition will be maintained Outcome: Progressing   Problem: Coping: Goal: Level of anxiety will decrease Outcome: Progressing   Problem: Elimination: Goal: Will not experience complications related to bowel motility Outcome: Progressing   Problem: Pain Managment: Goal: General experience of comfort will improve Outcome: Progressing   Problem: Safety: Goal: Ability to remain free from injury will improve Outcome: Progressing

## 2022-09-11 DIAGNOSIS — N390 Urinary tract infection, site not specified: Secondary | ICD-10-CM | POA: Diagnosis not present

## 2022-09-11 DIAGNOSIS — A419 Sepsis, unspecified organism: Secondary | ICD-10-CM | POA: Diagnosis not present

## 2022-09-11 LAB — CBC WITH DIFFERENTIAL/PLATELET
Abs Immature Granulocytes: 0.03 10*3/uL (ref 0.00–0.07)
Basophils Absolute: 0 10*3/uL (ref 0.0–0.1)
Basophils Relative: 0 %
Eosinophils Absolute: 0.1 10*3/uL (ref 0.0–0.5)
Eosinophils Relative: 2 %
HCT: 30.4 % — ABNORMAL LOW (ref 39.0–52.0)
Hemoglobin: 10.2 g/dL — ABNORMAL LOW (ref 13.0–17.0)
Immature Granulocytes: 1 %
Lymphocytes Relative: 19 %
Lymphs Abs: 1 10*3/uL (ref 0.7–4.0)
MCH: 27.4 pg (ref 26.0–34.0)
MCHC: 33.6 g/dL (ref 30.0–36.0)
MCV: 81.7 fL (ref 80.0–100.0)
Monocytes Absolute: 0.7 10*3/uL (ref 0.1–1.0)
Monocytes Relative: 13 %
Neutro Abs: 3.6 10*3/uL (ref 1.7–7.7)
Neutrophils Relative %: 65 %
Platelets: 200 10*3/uL (ref 150–400)
RBC: 3.72 MIL/uL — ABNORMAL LOW (ref 4.22–5.81)
RDW: 12.8 % (ref 11.5–15.5)
WBC: 5.6 10*3/uL (ref 4.0–10.5)
nRBC: 0 % (ref 0.0–0.2)

## 2022-09-11 LAB — BASIC METABOLIC PANEL
Anion gap: 10 (ref 5–15)
BUN: 34 mg/dL — ABNORMAL HIGH (ref 8–23)
CO2: 22 mmol/L (ref 22–32)
Calcium: 8.8 mg/dL — ABNORMAL LOW (ref 8.9–10.3)
Chloride: 107 mmol/L (ref 98–111)
Creatinine, Ser: 2 mg/dL — ABNORMAL HIGH (ref 0.61–1.24)
GFR, Estimated: 35 mL/min — ABNORMAL LOW (ref >60)
Glucose, Bld: 135 mg/dL — ABNORMAL HIGH (ref 70–99)
Potassium: 3.9 mmol/L (ref 3.5–5.1)
Sodium: 139 mmol/L (ref 135–145)

## 2022-09-11 LAB — URINE CULTURE: Culture: >=100000 — AB

## 2022-09-11 LAB — GLUCOSE, CAPILLARY
Glucose-Capillary: 118 mg/dL — ABNORMAL HIGH (ref 70–99)
Glucose-Capillary: 148 mg/dL — ABNORMAL HIGH (ref 70–99)
Glucose-Capillary: 122 mg/dL — ABNORMAL HIGH (ref 70–99)
Glucose-Capillary: 136 mg/dL — ABNORMAL HIGH (ref 70–99)

## 2022-09-11 MED ORDER — SODIUM CHLORIDE 0.9 % IV SOLN: INTRAVENOUS | Status: DC

## 2022-09-11 NOTE — Hospital Course (Signed)
72 year old with history of essential hypertension, type 2 diabetes, CKD with previous baseline creatinine in 01/05/2020 1.5 egfr 48, prostate cancer status post seed implant presented to the ER with 3 days of back pain, temperature 102, urinary frequency and urgency with dribbling.  In the ED: [id number] and febrile.  UA abnormal. CT scan with mild bilateral hydronephrosis likely due to prostatism.  Admitted secondary to UTI and sepsis and remained persistently febrile. Urine culture growing Klebsiella.  Blood culture no growth to date. Due to persistent fever antibiotic changed to Zosyn 09/10/22. Patient creatinine has remained stable/SLIGHTLY BETTER at 2.0 likely progressive CKD now at stage IV-no other recent labs available since 2021 to compare, metabolic acidosis improved Urine culture grew Klebsiella sensitive to ceftriaxone and cefepime-and patient has been doing well-afebrile since 8/15 afternoon.  With IV fluid hydration creatinine also improved to 1.8. He feels asymptomatic at this time and ready for discharge home Advised to follow-up with PCP to check CBC BMP in a week

## 2022-09-11 NOTE — Progress Notes (Signed)
PROGRESS NOTE JACOBEY BURTNETT  ZDG:[id number] DOB: Dec 18, 1950 DOA: 09/08/2022 PCP: Renaye Rakers, MD  Brief Narrative/Hospital Course: 72 year old with history of essential hypertension, type 2 diabetes, CKD with previous baseline creatinine in 01/05/2020 1.5 egfr 48, prostate cancer status post seed implant presented to the ER with 3 days of back pain, temperature 102, urinary frequency and urgency with dribbling.  In the ED: RJ:[id number] and febrile.  UA abnormal. CT scan with mild bilateral hydronephrosis likely due to prostatism.  Admitted secondary to UTI and sepsis and remained persistently febrile. Urine culture growing Klebsiella.  Blood culture no growth to date. Due to persistent fever antibiotic changed to Zosyn 09/10/22. Patient creatinine has remained stable/SLIGHTLY BETTER at 2.0 likely progressive CKD now at stage IV-no other recent labs available since 2021 to compare, metabolic acidosis improved    Subjective: Seen and examined Patient has remained afebrile since 8/15 BP stable, labs shows creatinine downtrending, CBC stable anemia No more frequency No dysurea no pain  Assessment and Plan: Principal Problem:   Sepsis secondary to UTI [facility]) Active Problems:   Renal insufficiency   DM2 (diabetes mellitus, type 2) (HCC)   HTN (hypertension)   History of prostate cancer   UTI (urinary tract infection)   Acute Klebsiella UTI present on admission Sepsis present on admission History of prostate cancer, followed by his urologist: Patient tachycardic tachypneic febrile on admission.  Blood culture NGTD urine culture Klebsiella. Patient had persistently remained febrile antibiotic changed to Zosyn 8/16 from Rocephin. Continue Flomax. No evidence of significant retention only had frequency and fever doubt prostatitis. Advised fu w/ urology- seen by [facility]  before Plan to switch to PO antibiotics in am   Acute kidney injury on CKD stage IIIb: previous baseline  creatinine in 01/05/2020: 1.5 effr 48, slightly downtrending.? Progressive CKD at this stage IV Recent Labs    09/08/22 2015 09/09/22 0400 09/11/22 0355  BUN 42* 36* 34*  CREATININE 2.24* 2.15* 2.00*  CO2 19* 18* 22   Normocytic anemia: hemoglobin is stable ~11 GM.Likely from CKD.  Monitor   Hypertension, stable  Type 2 diabetes, stable A1c 6.6, Home metformin and Farxiga on hold , on ssi,Victoza, Lantus Recent Labs  Lab 09/09/22 0400 09/09/22 0711 09/10/22 1324 09/10/22 1636 09/10/22 2100 09/11/22 0733 09/11/22 1113  GLUCAP  --    < > 151* 168* 185* 118* 148*  HGBA1C 6.6*  --   --   --   --   --   --    < > = values in this interval not displayed.   DVT prophylaxis: enoxaparin (LOVENOX) injection 40 mg Start: 09/09/22 1000 Code Status:   Code Status: Full Code Family Communication: plan of care discussed with patient  at bedside. Patient status is:  inpatient  because of UIT Level of care: Med-Surg   Dispo: The patient is from: Home            Anticipated disposition: home tomorrow Objective: Vitals last 24 hrs: Vitals:   09/10/22 0530 09/10/22 1326 09/10/22 2102 09/11/22 0446  BP: (!) 149/79 109/74 132/69 124/77  Pulse: 98 99 89 95  Resp: 20  20 16   Temp: 99.2 F (37.3 C) 99.1 F (37.3 C) 99.8 F (37.7 C) 98.9 F (37.2 C)  TempSrc: Oral Oral Oral Oral  SpO2: 100% 100% 100% 99%  Weight:      Height:       Weight change:   Physical Examination: General exam: alert awake, older than stated age HEENT:Oral mucosa  moist, Ear/Nose WNL grossly Respiratory system: bilaterally clear BS, no use of accessory muscle Cardiovascular system: S1 & S2 +, No JVD. Gastrointestinal system: Abdomen soft,NT,ND, BS+ Nervous System:Alert, awake, moving extremities. Extremities: LE edema neg,distal peripheral pulses palpable.  Skin: No rashes,no icterus. MSK: Normal muscle bulk,tone, power  Medications reviewed:  Scheduled Meds:  aspirin EC  81 mg Oral QHS   enoxaparin  (LOVENOX) injection  40 mg Subcutaneous Q24H   Fe Fum-Vit C-Vit B12-FA  1 capsule Oral QHS   insulin aspart  0-15 Units Subcutaneous TID WC   insulin aspart  0-5 Units Subcutaneous QHS   insulin glargine-yfgn  8 Units Subcutaneous Daily   liraglutide  1.8 mg Subcutaneous Daily   montelukast  10 mg Oral QHS   senna-docusate  1 tablet Oral BID   tamsulosin  0.4 mg Oral QHS   Testosterone  2 Pump Topical Daily   Continuous Infusions:  piperacillin-tazobactam (ZOSYN)  IV 3.375 g (09/11/22 0910)    Diet Order             Diet Carb Modified Fluid consistency: Thin; Room service appropriate? Yes  Diet effective now                  Intake/Output Summary (Last 24 hours) at 09/11/2022 1115 Last data filed at 09/11/2022 1003 Gross per 24 hour  Intake 172.72 ml  Output 1545 ml  Net -1372.28 ml   Net IO Since Admission: 790.61 mL [09/11/22 1115]  Wt Readings from Last 3 Encounters:  09/09/22 91.8 kg  01/02/20 92.5 kg  12/27/19 92.5 kg     Unresulted Labs (From admission, onward)    None     Data Reviewed: I have personally reviewed following labs and imaging studies CBC: Recent Labs  Lab 09/08/22 2015 09/09/22 0400 09/11/22 0355  WBC 8.2 7.0 5.6  NEUTROABS 6.6  --  3.6  HGB 12.2* 11.1* 10.2*  HCT 36.6* 33.2* 30.4*  MCV 82.4 82.6 81.7  PLT 237 202 200   Basic Metabolic Panel: Recent Labs  Lab 09/08/22 2015 09/09/22 0400 09/11/22 0355  NA 138 137 139  K 4.0 3.6 3.9  CL 108 107 107  CO2 19* 18* 22  GLUCOSE 182* 146* 135*  BUN 42* 36* 34*  CREATININE 2.24* 2.15* 2.00*  CALCIUM 9.7 9.1 8.8*  GFR: Estimated Creatinine Clearance: 37.5 mL/min (A) (by C-G formula based on SCr of 2 mg/dL (H)). Liver Function Tests: Recent Labs  Lab 09/08/22 2015  AST 15  ALT 20  ALKPHOS 48  BILITOT 0.6  PROT 8.3*  ALBUMIN 4.1   Recent Labs  Lab 09/08/22 2015 09/09/22 0400  INR 1.2 1.3*   Recent Results (from the past 240 hour(s))  Urine Culture     Status: Abnormal    Collection Time: 09/08/22  8:04 PM   Specimen: Urine, Random  Result Value Ref Range Status   Specimen Description   Final    URINE, RANDOM Performed at [facility], [address] [place], [place] [address]    Special Requests   Final    NONE Reflexed from [id number] Performed at [facility], [address] [place], [place] [address]    Culture >=100,000 COLONIES/mL KLEBSIELLA AEROGENES (A)  Final   Report Status 09/11/2022 FINAL  Final   Organism ID, Bacteria KLEBSIELLA AEROGENES (A)  Final      Susceptibility   Klebsiella aerogenes - MIC*    CEFEPIME <=0.12 SENSITIVE Sensitive  CEFTRIAXONE <=0.25 SENSITIVE Sensitive     CIPROFLOXACIN <=0.25 SENSITIVE Sensitive     GENTAMICIN <=1 SENSITIVE Sensitive     IMIPENEM 0.5 SENSITIVE Sensitive     NITROFURANTOIN 64 INTERMEDIATE Intermediate     TRIMETH/SULFA <=20 SENSITIVE Sensitive     PIP/TAZO <=4 SENSITIVE Sensitive     * >=100,000 COLONIES/mL KLEBSIELLA AEROGENES  Culture, blood (Routine x 2)     Status: None (Preliminary result)   Collection Time: 09/08/22  8:15 PM   Specimen: BLOOD LEFT HAND  Result Value Ref Range Status   Specimen Description   Final    BLOOD LEFT HAND Performed at [facility], [address], [place], [place] [address]    Special Requests   Final    BOTTLES DRAWN AEROBIC AND ANAEROBIC Blood Culture results may not be optimal due to an inadequate volume of blood received in culture bottles Performed at [facility], [address] [place], [place] [address]    Culture   Final    NO GROWTH 3 DAYS Performed at [facility], [address], [place], [place] [address]    Report Status PENDING  Incomplete  Resp panel by RT-PCR (RSV, Flu A&B, Covid) Anterior Nasal Swab     Status: None   Collection Time: 09/08/22  8:51 PM   Specimen: Anterior Nasal Swab  Result Value Ref Range Status   SARS Coronavirus 2 by RT PCR NEGATIVE NEGATIVE  Final    Comment: (NOTE) SARS-CoV-2 target nucleic acids are NOT DETECTED.  The SARS-CoV-2 RNA is generally detectable in upper respiratory specimens during the acute phase of infection. The lowest concentration of SARS-CoV-2 viral copies this assay can detect is 138 copies/mL. A negative result does not preclude SARS-Cov-2 infection and should not be used as the sole basis for treatment or other patient management decisions. A negative result may occur with  improper specimen collection/handling, submission of specimen other than nasopharyngeal swab, presence of viral mutation(s) within the areas targeted by this assay, and inadequate number of viral copies(<138 copies/mL). A negative result must be combined with clinical observations, patient history, and epidemiological information. The expected result is Negative.  Fact Sheet for Patients:  BloggerCourse.com  Fact Sheet for Healthcare Providers:  SeriousBroker.it  This test is no t yet approved or cleared by the [place] FDA and  has been authorized for detection and/or diagnosis of SARS-CoV-2 by FDA under an Emergency Use Authorization (EUA). This EUA will remain  in effect (meaning this test can be used) for the duration of the COVID-19 declaration under Section 564(b)(1) of the Act, 21 U.S.C.section 360bbb-3(b)(1), unless the authorization is terminated  or revoked sooner.       Influenza A by PCR NEGATIVE NEGATIVE Final   Influenza B by PCR NEGATIVE NEGATIVE Final    Comment: (NOTE) The Xpert Xpress SARS-CoV-2/FLU/RSV plus assay is intended as an aid in the diagnosis of influenza from Nasopharyngeal swab specimens and should not be used as a sole basis for treatment. Nasal washings and aspirates are unacceptable for Xpert Xpress SARS-CoV-2/FLU/RSV testing.  Fact Sheet for Patients: BloggerCourse.com  Fact Sheet for Healthcare  Providers: SeriousBroker.it  This test is not yet approved or cleared by the [place] FDA and has been authorized for detection and/or diagnosis of SARS-CoV-2 by FDA under an Emergency Use Authorization (EUA). This EUA will remain in effect (meaning this test can be used) for the duration of the COVID-19 declaration under Section 564(b)(1) of the  Act, 21 U.S.C. section 360bbb-3(b)(1), unless the authorization is terminated or revoked.     Resp Syncytial Virus by PCR NEGATIVE NEGATIVE Final    Comment: (NOTE) Fact Sheet for Patients: BloggerCourse.com  Fact Sheet for Healthcare Providers: SeriousBroker.it  This test is not yet approved or cleared by the [place] FDA and has been authorized for detection and/or diagnosis of SARS-CoV-2 by FDA under an Emergency Use Authorization (EUA). This EUA will remain in effect (meaning this test can be used) for the duration of the COVID-19 declaration under Section 564(b)(1) of the Act, 21 U.S.C. section 360bbb-3(b)(1), unless the authorization is terminated or revoked.  Performed at [facility], [address] [place], [place] [address]     Antimicrobials: Anti-infectives (From admission, onward)    Start     Dose/Rate Route Frequency Ordered Stop   09/10/22 1030  piperacillin-tazobactam (ZOSYN) IVPB 3.375 g        3.375 g 12.5 mL/hr over 240 Minutes Intravenous Every 8 hours 09/10/22 1017     09/08/22 2100  cefTRIAXone (ROCEPHIN) 2 g in sodium chloride 0.9 % 100 mL IVPB  Status:  Discontinued        2 g 200 mL/hr over 30 Minutes Intravenous Every 24 hours 09/08/22 2052 09/10/22 1001      Culture/Microbiology    Component Value Date/Time   SDES  09/08/2022 2015    BLOOD LEFT HAND Performed at [facility], [address], [place], [place] [address]    SPECREQUEST  09/08/2022 2015    BOTTLES DRAWN AEROBIC AND  ANAEROBIC Blood Culture results may not be optimal due to an inadequate volume of blood received in culture bottles Performed at [facility], [address], [place], [place] [address]    CULT  09/08/2022 2015    NO GROWTH 3 DAYS Performed at [facility], [address], [place], [place] [address]    REPTSTATUS PENDING 09/08/2022 2015     Radiology Studies: No results found.   LOS: 2 days   Lanae Boast, MD [facility]  09/11/2022, 11:15 AM

## 2022-09-11 NOTE — Plan of Care (Signed)
  Problem: Education: Goal: Knowledge of General Education information will improve Description: Including pain rating scale, medication(s)/side effects and non-pharmacologic comfort measures Outcome: Progressing   Problem: Health Behavior/Discharge Planning: Goal: Ability to manage health-related needs will improve Outcome: Progressing   Problem: Clinical Measurements: Goal: Ability to maintain clinical measurements within normal limits will improve Outcome: Progressing Goal: Diagnostic test results will improve Outcome: Progressing   Problem: Activity: Goal: Risk for activity intolerance will decrease Outcome: Progressing   Problem: Nutrition: Goal: Adequate nutrition will be maintained Outcome: Progressing   Problem: Coping: Goal: Level of anxiety will decrease Outcome: Progressing   Problem: Safety: Goal: Ability to remain free from injury will improve Outcome: Progressing   Problem: Skin Integrity: Goal: Risk for impaired skin integrity will decrease Outcome: Progressing

## 2022-09-12 DIAGNOSIS — N390 Urinary tract infection, site not specified: Secondary | ICD-10-CM | POA: Diagnosis not present

## 2022-09-12 DIAGNOSIS — A419 Sepsis, unspecified organism: Secondary | ICD-10-CM | POA: Diagnosis not present

## 2022-09-12 LAB — BASIC METABOLIC PANEL
Anion gap: 10 (ref 5–15)
BUN: 33 mg/dL — ABNORMAL HIGH (ref 8–23)
CO2: 21 mmol/L — ABNORMAL LOW (ref 22–32)
Calcium: 8.9 mg/dL (ref 8.9–10.3)
Chloride: 108 mmol/L (ref 98–111)
Creatinine, Ser: 1.88 mg/dL — ABNORMAL HIGH (ref 0.61–1.24)
GFR, Estimated: 37 mL/min — ABNORMAL LOW (ref >60)
Glucose, Bld: 128 mg/dL — ABNORMAL HIGH (ref 70–99)
Potassium: 3.7 mmol/L (ref 3.5–5.1)
Sodium: 139 mmol/L (ref 135–145)

## 2022-09-12 LAB — GLUCOSE, CAPILLARY
Glucose-Capillary: 121 mg/dL — ABNORMAL HIGH (ref 70–99)
Glucose-Capillary: 113 mg/dL — ABNORMAL HIGH (ref 70–99)

## 2022-09-12 MED ORDER — SULFAMETHOXAZOLE-TRIMETHOPRIM 800-160 MG PO TABS: 1.0000 | ORAL_TABLET | Freq: Two times a day (BID) | ORAL | 0 refills | Status: AC

## 2022-09-12 MED ORDER — SULFAMETHOXAZOLE-TRIMETHOPRIM 800-160 MG PO TABS: 1.0000 | ORAL_TABLET | Freq: Two times a day (BID) | ORAL | Status: DC

## 2022-09-12 MED ORDER — CEFDINIR 300 MG PO CAPS: 300.0000 mg | ORAL_CAPSULE | Freq: Two times a day (BID) | ORAL | Status: DC

## 2022-09-12 NOTE — Discharge Summary (Signed)
Physician Discharge Summary  Alexander Foster:[id number] DOB: 09-Jun-1950 DOA: 09/08/2022  PCP: Renaye Rakers, MD  Admit date: 09/08/2022 Discharge date: 09/12/2022 Recommendations for Outpatient Follow-up:  Follow up with PCP in 1 weeks-call for appointment Please obtain BMP/CBC in one week Follow-up with his outpatient urology coming week  Discharge Dispo: Home Discharge Condition: Stable Code Status:   Code Status: Full Code Diet recommendation:  Diet Order             Diet Carb Modified Fluid consistency: Thin; Room service appropriate? Yes  Diet effective now                    Brief/Interim Summary: 72 year old with history of essential hypertension, type 2 diabetes, CKD with previous baseline creatinine in 01/05/2020 1.5 egfr 48, prostate cancer status post seed implant presented to the ER with 3 days of back pain, temperature 102, urinary frequency and urgency with dribbling.  In the ED: [id number] and febrile.  UA abnormal. CT scan with mild bilateral hydronephrosis likely due to prostatism.  Admitted secondary to UTI and sepsis and remained persistently febrile. Urine culture growing Klebsiella.  Blood culture no growth to date. Due to persistent fever antibiotic changed to Zosyn 09/10/22. Patient creatinine has remained stable/SLIGHTLY BETTER at 2.0 likely progressive CKD now at stage IV-no other recent labs available since 2021 to compare, metabolic acidosis improved Urine culture grew Klebsiella sensitive to ceftriaxone and cefepime-and patient has been doing well-afebrile since 8/15 afternoon.  With IV fluid hydration creatinine also improved to 1.8. He feels asymptomatic at this time and ready for discharge home Advised to follow-up with PCP to check CBC BMP in a week   Discharge Diagnoses:  Principal Problem:   Sepsis secondary to UTI [facility]) Active Problems:   Renal insufficiency   DM2 (diabetes mellitus, type 2) (HCC)   HTN (hypertension)   History of  prostate cancer   UTI (urinary tract infection)  Acute Klebsiella UTI present on admission Sepsis present on admission History of prostate cancer, followed by his urologist: Patient tachycardic tachypneic febrile on admission.  Blood culture NGTD urine culture Klebsiella. Patient had persistently remained febrile antibiotic changed to Zosyn 8/16 from Rocephin. Continue Flomax. No evidence of significant retention only had frequency and fever doubt prostatitis. Advised fu w/ urology-will see Mr. On 8/14 and will see coming week.  Discussed with ID and pharmacy advising to continue Bactrim x 7 days    Acute kidney injury on CKD stage IIIb: previous baseline creatinine in 01/05/2020: 1.5 effr 48, creatinine down to 1.8- ? If this is baseline-he is advised to follow-up with PCP to check BMP  w/I 1 wk Recent Labs    09/08/22 2015 09/09/22 0400 09/11/22 0355 09/12/22 0427  BUN 42* 36* 34* 33*  CREATININE 2.24* 2.15* 2.00* 1.88*  CO2 19* 18* 22 21*   Normocytic anemia: hemoglobin is stable ~11 GM.Likely from CKD.  Monitor with follow-up with CBC continue age-appropriate cancer screening   Hypertension, stable  Type 2 diabetes, stable A1c 6.6, Home metformin and Farxiga held during hospitalization resume home meds Recent Labs  Lab 09/09/22 0400 09/09/22 0711 09/11/22 1113 09/11/22 1634 09/11/22 2123 09/12/22 0710 09/12/22 1115  GLUCAP  --    < > 148* 122* 136* 121* 113*  HGBA1C 6.6*  --   --   --   --   --   --    < > = values in this interval not displayed.    Consults: none Subjective:  Alert awake oriented afebrile.  He is resting comfortably. Eager to go home today.  Discharge Exam: Vitals:   09/11/22 2153 09/12/22 0509  BP: (!) 137/94 136/79  Pulse: 72 84  Resp:  12  Temp: 98.2 F (36.8 C) 98.5 F (36.9 C)  SpO2: 100% 98%   General: Pt is alert, awake, not in acute distress Cardiovascular: RRR, S1/S2 +, no rubs, no gallops Respiratory: CTA bilaterally, no  wheezing, no rhonchi Abdominal: Soft, NT, ND, bowel sounds + Extremities: no edema, no cyanosis  Discharge Instructions  Discharge Instructions     Discharge instructions   Complete by: As directed    Please call call MD or return to ER for similar or worsening recurring problem that brought you to hospital or if any fever,nausea/vomiting,abdominal pain, uncontrolled pain, chest pain,  shortness of breath or any other alarming symptoms.  Please follow-up your doctor as instructed in a week time and call the office for appointment.  Please avoid alcohol, smoking, or any other illicit substance and maintain healthy habits including taking your regular medications as prescribed.  You were cared for by a hospitalist during your hospital stay. If you have any questions about your discharge medications or the care you received while you were in the hospital after you are discharged, you can call the unit and ask to speak with the hospitalist on call if the hospitalist that took care of you is not available.  Once you are discharged, your primary care physician will handle any further medical issues. Please note that NO REFILLS for any discharge medications will be authorized once you are discharged, as it is imperative that you return to your primary care physician (or establish a relationship with a primary care physician if you do not have one) for your aftercare needs so that they can reassess your need for medications and monitor your lab values   Increase activity slowly   Complete by: As directed       Allergies as of 09/12/2022   No Known Allergies      Medication List     TAKE these medications    AndroGel Pump 20.25 MG/ACT (1.62%) Gel Generic drug: Testosterone Apply 2 Pump topically daily. Applied to each side of chest daily in the morning.   aspirin EC 81 MG tablet Take 81 mg by mouth at bedtime. Swallow whole.   Farxiga 5 MG Tabs tablet Generic drug: dapagliflozin  propanediol Take 5 mg by mouth daily.   Fusion Plus Caps Take 1 capsule by mouth at bedtime.   Kerendia 20 MG Tabs Generic drug: Finerenone Take 1 tablet by mouth daily.   metFORMIN 1000 MG tablet Commonly known as: GLUCOPHAGE Take 1,000 mg by mouth in the morning and at bedtime.   montelukast 10 MG tablet Commonly known as: SINGULAIR Take 10 mg by mouth at bedtime.   multivitamin with minerals Tabs tablet Take 1 tablet by mouth daily.   niacin 500 MG ER tablet Commonly known as: VITAMIN B3 Take 500 mg by mouth at bedtime.   sulfamethoxazole-trimethoprim 800-160 MG tablet Commonly known as: BACTRIM DS Take 1 tablet by mouth every 12 (twelve) hours for 7 days.   tamsulosin 0.4 MG Caps capsule Commonly known as: FLOMAX Take 0.4 mg by mouth at bedtime.   Evaristo Bury FlexTouch 200 UNIT/ML FlexTouch Pen Generic drug: insulin degludec Inject 12 Units into the skin daily.   Tribenzor 20-5-12.5 MG Tabs Generic drug: Olmesartan-amLODIPine-HCTZ Take 1 tablet by mouth daily. Hold for 1 week  after discharge then restart What changed:  how much to take when to take this additional instructions   Victoza 18 MG/3ML Sopn Generic drug: liraglutide Inject 1.8 mg into the skin daily.   Victoza 18 MG/3ML Sopn Generic drug: liraglutide Inject 1.8 mg into the skin daily.        Follow-up Information     Renaye Rakers, MD Follow up in 1 week(s).   Specialty: Family Medicine Contact information: [address] [place] [address] [phone]         [facility] Follow up in 1 week(s).   Contact information: [address] [place] [address] [place] [address] [phone]               No Known Allergies  The results of significant diagnostics from this hospitalization (including imaging, microbiology, ancillary and laboratory) are listed below for reference.    Microbiology: Recent Results (from the past 240 hour(s))  Urine Culture      Status: Abnormal   Collection Time: 09/08/22  8:04 PM   Specimen: Urine, Random  Result Value Ref Range Status   Specimen Description   Final    URINE, RANDOM Performed at [facility], [address], [place], [place] [address]    Special Requests   Final    NONE Reflexed from [id number] Performed at [facility], [address], [place], [place] [address]    Culture >=100,000 COLONIES/mL KLEBSIELLA AEROGENES (A)  Final   Report Status 09/11/2022 FINAL  Final   Organism ID, Bacteria KLEBSIELLA AEROGENES (A)  Final      Susceptibility   Klebsiella aerogenes - MIC*    CEFEPIME <=0.12 SENSITIVE Sensitive     CEFTRIAXONE <=0.25 SENSITIVE Sensitive     CIPROFLOXACIN <=0.25 SENSITIVE Sensitive     GENTAMICIN <=1 SENSITIVE Sensitive     IMIPENEM 0.5 SENSITIVE Sensitive     NITROFURANTOIN 64 INTERMEDIATE Intermediate     TRIMETH/SULFA <=20 SENSITIVE Sensitive     PIP/TAZO <=4 SENSITIVE Sensitive     * >=100,000 COLONIES/mL KLEBSIELLA AEROGENES  Culture, blood (Routine x 2)     Status: None (Preliminary result)   Collection Time: 09/08/22  8:15 PM   Specimen: BLOOD LEFT HAND  Result Value Ref Range Status   Specimen Description   Final    BLOOD LEFT HAND Performed at [facility], [address] [place], [place] [address]    Special Requests   Final    BOTTLES DRAWN AEROBIC AND ANAEROBIC Blood Culture results may not be optimal due to an inadequate volume of blood received in culture bottles Performed at [facility], [address] [place], [place] [address]    Culture   Final    NO GROWTH 4 DAYS Performed at [facility], [address] [place], [place] [address]    Report Status PENDING  Incomplete  Resp panel by RT-PCR (RSV, Flu A&B, Covid) Anterior Nasal Swab     Status: None   Collection Time: 09/08/22  8:51 PM   Specimen: Anterior Nasal Swab  Result Value Ref Range Status   SARS Coronavirus 2 by RT PCR  NEGATIVE NEGATIVE Final    Comment: (NOTE) SARS-CoV-2 target nucleic acids are NOT DETECTED.  The SARS-CoV-2 RNA is generally detectable in upper respiratory specimens during the acute phase of infection. The lowest concentration of SARS-CoV-2 viral copies this assay can detect is 138 copies/mL. A negative result does  not preclude SARS-Cov-2 infection and should not be used as the sole basis for treatment or other patient management decisions. A negative result may occur with  improper specimen collection/handling, submission of specimen other than nasopharyngeal swab, presence of viral mutation(s) within the areas targeted by this assay, and inadequate number of viral copies(<138 copies/mL). A negative result must be combined with clinical observations, patient history, and epidemiological information. The expected result is Negative.  Fact Sheet for Patients:  BloggerCourse.com  Fact Sheet for Healthcare Providers:  SeriousBroker.it  This test is no t yet approved or cleared by the [place] FDA and  has been authorized for detection and/or diagnosis of SARS-CoV-2 by FDA under an Emergency Use Authorization (EUA). This EUA will remain  in effect (meaning this test can be used) for the duration of the COVID-19 declaration under Section 564(b)(1) of the Act, 21 U.S.C.section 360bbb-3(b)(1), unless the authorization is terminated  or revoked sooner.       Influenza A by PCR NEGATIVE NEGATIVE Final   Influenza B by PCR NEGATIVE NEGATIVE Final    Comment: (NOTE) The Xpert Xpress SARS-CoV-2/FLU/RSV plus assay is intended as an aid in the diagnosis of influenza from Nasopharyngeal swab specimens and should not be used as a sole basis for treatment. Nasal washings and aspirates are unacceptable for Xpert Xpress SARS-CoV-2/FLU/RSV testing.  Fact Sheet for Patients: BloggerCourse.com  Fact Sheet for  Healthcare Providers: SeriousBroker.it  This test is not yet approved or cleared by the [place] FDA and has been authorized for detection and/or diagnosis of SARS-CoV-2 by FDA under an Emergency Use Authorization (EUA). This EUA will remain in effect (meaning this test can be used) for the duration of the COVID-19 declaration under Section 564(b)(1) of the Act, 21 U.S.C. section 360bbb-3(b)(1), unless the authorization is terminated or revoked.     Resp Syncytial Virus by PCR NEGATIVE NEGATIVE Final    Comment: (NOTE) Fact Sheet for Patients: BloggerCourse.com  Fact Sheet for Healthcare Providers: SeriousBroker.it  This test is not yet approved or cleared by the [place] FDA and has been authorized for detection and/or diagnosis of SARS-CoV-2 by FDA under an Emergency Use Authorization (EUA). This EUA will remain in effect (meaning this test can be used) for the duration of the COVID-19 declaration under Section 564(b)(1) of the Act, 21 U.S.C. section 360bbb-3(b)(1), unless the authorization is terminated or revoked.  Performed at [facility], [address] [place], [place] [address]     Procedures/Studies: CT Renal Stone Study  Result Date: 09/08/2022 CLINICAL DATA:  Urinary frequency, back pain, fever. EXAM: CT ABDOMEN AND PELVIS WITHOUT CONTRAST TECHNIQUE: Multidetector CT imaging of the abdomen and pelvis was performed following the standard protocol without IV contrast. RADIATION DOSE REDUCTION: This exam was performed according to the departmental dose-optimization program which includes automated exposure control, adjustment of the mA and/or kV according to patient size and/or use of iterative reconstruction technique. COMPARISON:  05/11/2019 FINDINGS: Lower chest: Mild aortic valve calcification. Hepatobiliary: Diffuse hepatic steatosis. 3.0 cm in long axis gallstone  in the gallbladder. No biliary dilatation. Pancreas: Unremarkable Spleen: Unremarkable Adrenals/Urinary Tract: 1.1 by 1.0 cm fat density myelolipoma of the right adrenal gland. No further imaging workup of this lesion is indicated. Mild bilateral hydroureter without substantial hydronephrosis and without urinary tract calculi. Mild urinary bladder wall thickening, query chronic cystitis. Stomach/Bowel: Small periampullary duodenal diverticulum. Normal appendix. Scattered transverse colon diverticula along with diverticulosis of the proximal sigmoid colon. Mid to distal sigmoid colon  anastomosis without complicating feature. Vascular/Lymphatic: Mild abdominal aortic atherosclerotic vascular calcification. No pathologic adenopathy observed. Reproductive: Brachytherapy seed implants distributed in the prostate gland. Other: Anterior pelvic lymph node measures 5 mm in short axis on image 68 series 2, formerly 10 mm. This is likely benign. Subcutaneous edema along the anterior abdominal wall, left greater than right, likely injection related. Musculoskeletal: Bridging spurring of the left sacroiliac joint. Probable bone island in the left iliac bone on image 60 series 2, no change from 2021. Lower lumbar spondylosis and degenerative disc disease with resulting bilateral foraminal impingement at L5-S1. IMPRESSION: 1. Mild bilateral hydroureter without substantial hydronephrosis and without urinary tract calculi. Mild urinary bladder wall thickening, query chronic cystitis. Possibility of low-grade bladder outlet obstruction is raised. 2. Brachytherapy seed implants distributed in the prostate gland. 3. Diffuse hepatic steatosis. 4. Cholelithiasis. 5. Colonic diverticulosis. 6. Lower lumbar spondylosis and degenerative disc disease with resulting bilateral foraminal impingement at L5-S1. 7. Mild aortic valve calcification. 8. Aortic atherosclerosis. Aortic Atherosclerosis (ICD10-I70.0). Electronically Signed   By: Gaylyn Rong M.D.   On: 09/08/2022 21:49   DG Chest Port 1 View  Result Date: 09/08/2022 CLINICAL DATA:  Urinary frequency, back pain, fever EXAM: PORTABLE CHEST 1 VIEW COMPARISON:  09/03/2017 FINDINGS: Platelike scarring/atelectasis in the right lower lobe. Left lung is clear. No pleural effusion or pneumothorax. The heart is normal in size. IMPRESSION: No acute cardiopulmonary disease. Electronically Signed   By: Charline Bills M.D.   On: 09/08/2022 20:09    Labs: BNP (last 3 results) No results for input(s): "BNP" in the last 8760 hours. Basic Metabolic Panel: Recent Labs  Lab 09/08/22 2015 09/09/22 0400 09/11/22 0355 09/12/22 0427  NA 138 137 139 139  K 4.0 3.6 3.9 3.7  CL 108 107 107 108  CO2 19* 18* 22 21*  GLUCOSE 182* 146* 135* 128*  BUN 42* 36* 34* 33*  CREATININE 2.24* 2.15* 2.00* 1.88*  CALCIUM 9.7 9.1 8.8* 8.9   Liver Function Tests: Recent Labs  Lab 09/08/22 2015  AST 15  ALT 20  ALKPHOS 48  BILITOT 0.6  PROT 8.3*  ALBUMIN 4.1   No results for input(s): "LIPASE", "AMYLASE" in the last 168 hours. No results for input(s): "AMMONIA" in the last 168 hours. CBC: Recent Labs  Lab 09/08/22 2015 09/09/22 0400 09/11/22 0355  WBC 8.2 7.0 5.6  NEUTROABS 6.6  --  3.6  HGB 12.2* 11.1* 10.2*  HCT 36.6* 33.2* 30.4*  MCV 82.4 82.6 81.7  PLT 237 202 200   Recent Labs  Lab 09/11/22 1113 09/11/22 1634 09/11/22 2123 09/12/22 0710 09/12/22 1115  GLUCAP 148* 122* 136* 121* 113*      Component Value Date/Time   COLORURINE YELLOW 09/08/2022 2004   APPEARANCEUR CLOUDY (A) 09/08/2022 2004   LABSPEC 1.012 09/08/2022 2004   PHURINE 5.0 09/08/2022 2004   GLUCOSEU NEGATIVE 09/08/2022 2004   HGBUR LARGE (A) 09/08/2022 2004   BILIRUBINUR NEGATIVE 09/08/2022 2004   KETONESUR NEGATIVE 09/08/2022 2004   PROTEINUR 30 (A) 09/08/2022 2004   UROBILINOGEN 0.2 01/13/2009 1959   NITRITE NEGATIVE 09/08/2022 2004   LEUKOCYTESUR LARGE (A) 09/08/2022 2004   Sepsis  Labs Recent Labs  Lab 09/08/22 2015 09/09/22 0400 09/11/22 0355  WBC 8.2 7.0 5.6   Microbiology Recent Results (from the past 240 hour(s))  Urine Culture     Status: Abnormal   Collection Time: 09/08/22  8:04 PM   Specimen: Urine, Random  Result Value Ref Range Status  Specimen Description   Final    URINE, RANDOM Performed at [facility] [address] [place], [place] [address]    Special Requests   Final    NONE Reflexed from [id number] Performed at [facility], [address] [place], [place] [address]    Culture >=100,000 COLONIES/mL KLEBSIELLA AEROGENES (A)  Final   Report Status 09/11/2022 FINAL  Final   Organism ID, Bacteria KLEBSIELLA AEROGENES (A)  Final      Susceptibility   Klebsiella aerogenes - MIC*    CEFEPIME <=0.12 SENSITIVE Sensitive     CEFTRIAXONE <=0.25 SENSITIVE Sensitive     CIPROFLOXACIN <=0.25 SENSITIVE Sensitive     GENTAMICIN <=1 SENSITIVE Sensitive     IMIPENEM 0.5 SENSITIVE Sensitive     NITROFURANTOIN 64 INTERMEDIATE Intermediate     TRIMETH/SULFA <=20 SENSITIVE Sensitive     PIP/TAZO <=4 SENSITIVE Sensitive     * >=100,000 COLONIES/mL KLEBSIELLA AEROGENES  Culture, blood (Routine x 2)     Status: None (Preliminary result)   Collection Time: 09/08/22  8:15 PM   Specimen: BLOOD LEFT HAND  Result Value Ref Range Status   Specimen Description   Final    BLOOD LEFT HAND Performed at [facility], [address], [place], [place] [address]    Special Requests   Final    BOTTLES DRAWN AEROBIC AND ANAEROBIC Blood Culture results may not be optimal due to an inadequate volume of blood received in culture bottles Performed at [facility], [address] [place], [place] [address]    Culture   Final    NO GROWTH 4 DAYS Performed at [facility], [address], [place], [place] [address]    Report Status PENDING  Incomplete  Resp panel by RT-PCR (RSV, Flu A&B, Covid)  Anterior Nasal Swab     Status: None   Collection Time: 09/08/22  8:51 PM   Specimen: Anterior Nasal Swab  Result Value Ref Range Status   SARS Coronavirus 2 by RT PCR NEGATIVE NEGATIVE Final    Comment: (NOTE) SARS-CoV-2 target nucleic acids are NOT DETECTED.  The SARS-CoV-2 RNA is generally detectable in upper respiratory specimens during the acute phase of infection. The lowest concentration of SARS-CoV-2 viral copies this assay can detect is 138 copies/mL. A negative result does not preclude SARS-Cov-2 infection and should not be used as the sole basis for treatment or other patient management decisions. A negative result may occur with  improper specimen collection/handling, submission of specimen other than nasopharyngeal swab, presence of viral mutation(s) within the areas targeted by this assay, and inadequate number of viral copies(<138 copies/mL). A negative result must be combined with clinical observations, patient history, and epidemiological information. The expected result is Negative.  Fact Sheet for Patients:  BloggerCourse.com  Fact Sheet for Healthcare Providers:  SeriousBroker.it  This test is no t yet approved or cleared by the [place] FDA and  has been authorized for detection and/or diagnosis of SARS-CoV-2 by FDA under an Emergency Use Authorization (EUA). This EUA will remain  in effect (meaning this test can be used) for the duration of the COVID-19 declaration under Section 564(b)(1) of the Act, 21 U.S.C.section 360bbb-3(b)(1), unless the authorization is terminated  or revoked sooner.       Influenza A by PCR NEGATIVE NEGATIVE Final   Influenza B by PCR NEGATIVE NEGATIVE Final    Comment: (NOTE) The Xpert Xpress SARS-CoV-2/FLU/RSV plus assay is intended as  an aid in the diagnosis of influenza from Nasopharyngeal swab specimens and should not be used as a sole basis for treatment. Nasal washings  and aspirates are unacceptable for Xpert Xpress SARS-CoV-2/FLU/RSV testing.  Fact Sheet for Patients: BloggerCourse.com  Fact Sheet for Healthcare Providers: SeriousBroker.it  This test is not yet approved or cleared by the [place] FDA and has been authorized for detection and/or diagnosis of SARS-CoV-2 by FDA under an Emergency Use Authorization (EUA). This EUA will remain in effect (meaning this test can be used) for the duration of the COVID-19 declaration under Section 564(b)(1) of the Act, 21 U.S.C. section 360bbb-3(b)(1), unless the authorization is terminated or revoked.     Resp Syncytial Virus by PCR NEGATIVE NEGATIVE Final    Comment: (NOTE) Fact Sheet for Patients: BloggerCourse.com  Fact Sheet for Healthcare Providers: SeriousBroker.it  This test is not yet approved or cleared by the [place] FDA and has been authorized for detection and/or diagnosis of SARS-CoV-2 by FDA under an Emergency Use Authorization (EUA). This EUA will remain in effect (meaning this test can be used) for the duration of the COVID-19 declaration under Section 564(b)(1) of the Act, 21 U.S.C. section 360bbb-3(b)(1), unless the authorization is terminated or revoked.  Performed at [facility], [address], [place], [place] [address]      Time coordinating discharge: 25 minutes  SIGNED: Lanae Boast, MD  [facility] 09/12/2022, 12:03 PM  If 7PM-7AM, please contact night-coverage [email or web address]

## 2022-09-13 LAB — CULTURE, BLOOD (ROUTINE X 2): Culture: NO GROWTH

## 2022-09-17 DIAGNOSIS — E291 Testicular hypofunction: Secondary | ICD-10-CM | POA: Diagnosis not present

## 2022-09-17 DIAGNOSIS — R3912 Poor urinary stream: Secondary | ICD-10-CM | POA: Diagnosis not present

## 2022-09-17 DIAGNOSIS — N401 Enlarged prostate with lower urinary tract symptoms: Secondary | ICD-10-CM | POA: Diagnosis not present

## 2022-09-17 DIAGNOSIS — Z8546 Personal history of malignant neoplasm of prostate: Secondary | ICD-10-CM | POA: Diagnosis not present

## 2022-09-17 DIAGNOSIS — R3915 Urgency of urination: Secondary | ICD-10-CM | POA: Diagnosis not present

## 2022-09-28 DIAGNOSIS — E785 Hyperlipidemia, unspecified: Secondary | ICD-10-CM | POA: Diagnosis not present

## 2022-09-28 DIAGNOSIS — E1169 Type 2 diabetes mellitus with other specified complication: Secondary | ICD-10-CM | POA: Diagnosis not present

## 2022-09-28 DIAGNOSIS — E782 Mixed hyperlipidemia: Secondary | ICD-10-CM | POA: Diagnosis not present

## 2022-09-28 DIAGNOSIS — N189 Chronic kidney disease, unspecified: Secondary | ICD-10-CM | POA: Diagnosis not present

## 2022-09-28 DIAGNOSIS — A419 Sepsis, unspecified organism: Secondary | ICD-10-CM | POA: Diagnosis not present

## 2022-09-29 DIAGNOSIS — R3121 Asymptomatic microscopic hematuria: Secondary | ICD-10-CM | POA: Diagnosis not present

## 2022-09-29 DIAGNOSIS — R351 Nocturia: Secondary | ICD-10-CM | POA: Diagnosis not present

## 2022-09-29 DIAGNOSIS — N401 Enlarged prostate with lower urinary tract symptoms: Secondary | ICD-10-CM | POA: Diagnosis not present

## 2022-10-12 DIAGNOSIS — E1169 Type 2 diabetes mellitus with other specified complication: Secondary | ICD-10-CM | POA: Diagnosis not present

## 2022-10-12 DIAGNOSIS — I10 Essential (primary) hypertension: Secondary | ICD-10-CM | POA: Diagnosis not present

## 2022-10-12 DIAGNOSIS — E162 Hypoglycemia, unspecified: Secondary | ICD-10-CM | POA: Diagnosis not present

## 2022-10-12 DIAGNOSIS — D630 Anemia in neoplastic disease: Secondary | ICD-10-CM | POA: Diagnosis not present

## 2022-10-12 DIAGNOSIS — N189 Chronic kidney disease, unspecified: Secondary | ICD-10-CM | POA: Diagnosis not present

## 2022-12-02 DIAGNOSIS — E1169 Type 2 diabetes mellitus with other specified complication: Secondary | ICD-10-CM | POA: Diagnosis not present

## 2022-12-02 DIAGNOSIS — I10 Essential (primary) hypertension: Secondary | ICD-10-CM | POA: Diagnosis not present

## 2022-12-16 DIAGNOSIS — H40013 Open angle with borderline findings, low risk, bilateral: Secondary | ICD-10-CM | POA: Diagnosis not present

## 2022-12-16 DIAGNOSIS — H3581 Retinal edema: Secondary | ICD-10-CM | POA: Diagnosis not present

## 2022-12-16 DIAGNOSIS — H524 Presbyopia: Secondary | ICD-10-CM | POA: Diagnosis not present

## 2022-12-16 DIAGNOSIS — E119 Type 2 diabetes mellitus without complications: Secondary | ICD-10-CM | POA: Diagnosis not present

## 2022-12-16 DIAGNOSIS — H25813 Combined forms of age-related cataract, bilateral: Secondary | ICD-10-CM | POA: Diagnosis not present

## 2023-01-04 DIAGNOSIS — E291 Testicular hypofunction: Secondary | ICD-10-CM | POA: Diagnosis not present

## 2023-01-04 DIAGNOSIS — R3912 Poor urinary stream: Secondary | ICD-10-CM | POA: Diagnosis not present

## 2023-01-04 DIAGNOSIS — R350 Frequency of micturition: Secondary | ICD-10-CM | POA: Diagnosis not present

## 2023-01-04 DIAGNOSIS — N401 Enlarged prostate with lower urinary tract symptoms: Secondary | ICD-10-CM | POA: Diagnosis not present

## 2023-01-04 DIAGNOSIS — C61 Malignant neoplasm of prostate: Secondary | ICD-10-CM | POA: Diagnosis not present

## 2023-01-04 DIAGNOSIS — R948 Abnormal results of function studies of other organs and systems: Secondary | ICD-10-CM | POA: Diagnosis not present

## 2023-02-02 ENCOUNTER — Other Ambulatory Visit (HOSPITAL_COMMUNITY): Payer: Self-pay

## 2023-02-02 MED ORDER — OZEMPIC (1 MG/DOSE) 4 MG/3ML ~~LOC~~ SOPN
1.0000 mg | PEN_INJECTOR | SUBCUTANEOUS | 1 refills | Status: AC
  Filled 2023-02-02: qty 3, 28d supply, fill #0

## 2023-02-14 ENCOUNTER — Other Ambulatory Visit (HOSPITAL_COMMUNITY): Payer: Self-pay

## 2023-02-22 ENCOUNTER — Other Ambulatory Visit (HOSPITAL_COMMUNITY): Payer: Self-pay

## 2023-02-22 MED ORDER — OZEMPIC (1 MG/DOSE) 4 MG/3ML ~~LOC~~ SOPN
1.0000 mg | PEN_INJECTOR | SUBCUTANEOUS | 1 refills | Status: AC
  Filled 2023-02-22: qty 3, 28d supply, fill #0

## 2023-03-02 DIAGNOSIS — E782 Mixed hyperlipidemia: Secondary | ICD-10-CM | POA: Diagnosis not present

## 2023-03-02 DIAGNOSIS — E1169 Type 2 diabetes mellitus with other specified complication: Secondary | ICD-10-CM | POA: Diagnosis not present

## 2023-03-02 DIAGNOSIS — N189 Chronic kidney disease, unspecified: Secondary | ICD-10-CM | POA: Diagnosis not present

## 2023-03-02 DIAGNOSIS — I10 Essential (primary) hypertension: Secondary | ICD-10-CM | POA: Diagnosis not present

## 2023-03-03 DIAGNOSIS — E1169 Type 2 diabetes mellitus with other specified complication: Secondary | ICD-10-CM | POA: Diagnosis not present

## 2023-03-03 DIAGNOSIS — E782 Mixed hyperlipidemia: Secondary | ICD-10-CM | POA: Diagnosis not present

## 2023-03-03 DIAGNOSIS — I10 Essential (primary) hypertension: Secondary | ICD-10-CM | POA: Diagnosis not present

## 2023-03-03 DIAGNOSIS — D630 Anemia in neoplastic disease: Secondary | ICD-10-CM | POA: Diagnosis not present

## 2023-04-08 DIAGNOSIS — E1122 Type 2 diabetes mellitus with diabetic chronic kidney disease: Secondary | ICD-10-CM | POA: Diagnosis not present

## 2023-04-08 DIAGNOSIS — I129 Hypertensive chronic kidney disease with stage 1 through stage 4 chronic kidney disease, or unspecified chronic kidney disease: Secondary | ICD-10-CM | POA: Diagnosis not present

## 2023-04-08 DIAGNOSIS — N189 Chronic kidney disease, unspecified: Secondary | ICD-10-CM | POA: Diagnosis not present

## 2023-05-16 DIAGNOSIS — E1169 Type 2 diabetes mellitus with other specified complication: Secondary | ICD-10-CM | POA: Diagnosis not present

## 2023-05-16 DIAGNOSIS — N1832 Chronic kidney disease, stage 3b: Secondary | ICD-10-CM | POA: Diagnosis not present

## 2023-05-16 DIAGNOSIS — E785 Hyperlipidemia, unspecified: Secondary | ICD-10-CM | POA: Diagnosis not present

## 2023-05-16 DIAGNOSIS — I129 Hypertensive chronic kidney disease with stage 1 through stage 4 chronic kidney disease, or unspecified chronic kidney disease: Secondary | ICD-10-CM | POA: Diagnosis not present

## 2023-06-29 DIAGNOSIS — E291 Testicular hypofunction: Secondary | ICD-10-CM | POA: Diagnosis not present

## 2023-07-11 DIAGNOSIS — S86811A Strain of other muscle(s) and tendon(s) at lower leg level, right leg, initial encounter: Secondary | ICD-10-CM | POA: Diagnosis not present

## 2023-07-11 DIAGNOSIS — M25561 Pain in right knee: Secondary | ICD-10-CM | POA: Diagnosis not present

## 2023-07-26 DIAGNOSIS — Z8546 Personal history of malignant neoplasm of prostate: Secondary | ICD-10-CM | POA: Diagnosis not present

## 2023-07-26 DIAGNOSIS — E291 Testicular hypofunction: Secondary | ICD-10-CM | POA: Diagnosis not present

## 2023-07-26 DIAGNOSIS — R3915 Urgency of urination: Secondary | ICD-10-CM | POA: Diagnosis not present

## 2023-07-26 DIAGNOSIS — N401 Enlarged prostate with lower urinary tract symptoms: Secondary | ICD-10-CM | POA: Diagnosis not present

## 2023-08-15 DIAGNOSIS — R5383 Other fatigue: Secondary | ICD-10-CM | POA: Diagnosis not present

## 2023-08-15 DIAGNOSIS — N1832 Chronic kidney disease, stage 3b: Secondary | ICD-10-CM | POA: Diagnosis not present

## 2023-08-15 DIAGNOSIS — E782 Mixed hyperlipidemia: Secondary | ICD-10-CM | POA: Diagnosis not present

## 2023-08-15 DIAGNOSIS — E785 Hyperlipidemia, unspecified: Secondary | ICD-10-CM | POA: Diagnosis not present

## 2023-08-15 DIAGNOSIS — I10 Essential (primary) hypertension: Secondary | ICD-10-CM | POA: Diagnosis not present

## 2023-08-15 DIAGNOSIS — Z6826 Body mass index (BMI) 26.0-26.9, adult: Secondary | ICD-10-CM | POA: Diagnosis not present

## 2023-08-15 DIAGNOSIS — I129 Hypertensive chronic kidney disease with stage 1 through stage 4 chronic kidney disease, or unspecified chronic kidney disease: Secondary | ICD-10-CM | POA: Diagnosis not present

## 2023-08-15 DIAGNOSIS — E1122 Type 2 diabetes mellitus with diabetic chronic kidney disease: Secondary | ICD-10-CM | POA: Diagnosis not present

## 2023-12-15 DIAGNOSIS — H35413 Lattice degeneration of retina, bilateral: Secondary | ICD-10-CM | POA: Diagnosis not present

## 2023-12-15 DIAGNOSIS — H25813 Combined forms of age-related cataract, bilateral: Secondary | ICD-10-CM | POA: Diagnosis not present

## 2023-12-15 DIAGNOSIS — E1169 Type 2 diabetes mellitus with other specified complication: Secondary | ICD-10-CM | POA: Diagnosis not present

## 2023-12-15 DIAGNOSIS — H40013 Open angle with borderline findings, low risk, bilateral: Secondary | ICD-10-CM | POA: Diagnosis not present

## 2023-12-15 DIAGNOSIS — H524 Presbyopia: Secondary | ICD-10-CM | POA: Diagnosis not present

## 2023-12-15 DIAGNOSIS — I10 Essential (primary) hypertension: Secondary | ICD-10-CM | POA: Diagnosis not present

## 2023-12-15 DIAGNOSIS — E119 Type 2 diabetes mellitus without complications: Secondary | ICD-10-CM | POA: Diagnosis not present

## 2023-12-16 DIAGNOSIS — E1169 Type 2 diabetes mellitus with other specified complication: Secondary | ICD-10-CM | POA: Diagnosis not present

## 2024-03-01 ENCOUNTER — Encounter: Payer: Self-pay | Admitting: *Deleted

## 2024-03-01 NOTE — Progress Notes (Signed)
 Alexander Foster                                          MRN: [id number]   03/01/2024   The [facility] Quality Team Specialist reviewed this patient medical record for the purposes of chart review for care gap closure. The following were reviewed: chart review for care gap closure-controlling blood pressure.    [facility] Quality Team
